# Patient Record
Sex: Male | Born: 1980 | Race: White | Hispanic: No | Smoking: Never smoker
Health system: Southern US, Community
[De-identification: ages and names within clinical notes are randomized; demographics above are authoritative.]

## PROBLEM LIST (undated history)

## (undated) DIAGNOSIS — H8109 Meniere's disease, unspecified ear: Secondary | ICD-10-CM

## (undated) DIAGNOSIS — G43909 Migraine, unspecified, not intractable, without status migrainosus: Secondary | ICD-10-CM

## (undated) DIAGNOSIS — F329 Major depressive disorder, single episode, unspecified: Secondary | ICD-10-CM

## (undated) DIAGNOSIS — F419 Anxiety disorder, unspecified: Secondary | ICD-10-CM

## (undated) DIAGNOSIS — K219 Gastro-esophageal reflux disease without esophagitis: Secondary | ICD-10-CM

## (undated) DIAGNOSIS — G930 Cerebral cysts: Secondary | ICD-10-CM

## (undated) DIAGNOSIS — J309 Allergic rhinitis, unspecified: Secondary | ICD-10-CM

## (undated) DIAGNOSIS — R112 Nausea with vomiting, unspecified: Secondary | ICD-10-CM

## (undated) DIAGNOSIS — E890 Postprocedural hypothyroidism: Secondary | ICD-10-CM

## (undated) DIAGNOSIS — I10 Essential (primary) hypertension: Secondary | ICD-10-CM

## (undated) DIAGNOSIS — Z808 Family history of malignant neoplasm of other organs or systems: Secondary | ICD-10-CM

## (undated) DIAGNOSIS — Z9889 Other specified postprocedural states: Secondary | ICD-10-CM

## (undated) DIAGNOSIS — C189 Malignant neoplasm of colon, unspecified: Secondary | ICD-10-CM

## (undated) DIAGNOSIS — Z803 Family history of malignant neoplasm of breast: Secondary | ICD-10-CM

## (undated) DIAGNOSIS — E785 Hyperlipidemia, unspecified: Secondary | ICD-10-CM

## (undated) DIAGNOSIS — F458 Other somatoform disorders: Secondary | ICD-10-CM

## (undated) HISTORY — PX: XI ROBOTIC ASSISTED LOWER ANTERIOR RESECTION: SHX6558

## (undated) HISTORY — PX: OTHER SURGICAL HISTORY: SHX169

## (undated) HISTORY — DX: Essential (primary) hypertension: I10

## (undated) HISTORY — DX: Malignant neoplasm of colon, unspecified: C18.9

## (undated) HISTORY — DX: Hyperlipidemia, unspecified: E78.5

## (undated) HISTORY — DX: Allergic rhinitis, unspecified: J30.9

## (undated) HISTORY — DX: Other somatoform disorders: F45.8

## (undated) HISTORY — DX: Family history of malignant neoplasm of other organs or systems: Z80.8

## (undated) HISTORY — DX: Gastro-esophageal reflux disease without esophagitis: K21.9

## (undated) HISTORY — DX: Family history of malignant neoplasm of breast: Z80.3

## (undated) HISTORY — DX: Major depressive disorder, single episode, unspecified: F32.9

## (undated) HISTORY — DX: Anxiety disorder, unspecified: F41.9

---

## 2010-07-07 ENCOUNTER — Ambulatory Visit: Payer: Self-pay | Admitting: Internal Medicine

## 2010-07-07 DIAGNOSIS — F329 Major depressive disorder, single episode, unspecified: Secondary | ICD-10-CM

## 2010-07-07 DIAGNOSIS — K219 Gastro-esophageal reflux disease without esophagitis: Secondary | ICD-10-CM

## 2010-07-07 DIAGNOSIS — F411 Generalized anxiety disorder: Secondary | ICD-10-CM | POA: Insufficient documentation

## 2010-07-07 DIAGNOSIS — R5381 Other malaise: Secondary | ICD-10-CM

## 2010-07-07 DIAGNOSIS — I10 Essential (primary) hypertension: Secondary | ICD-10-CM

## 2010-07-07 DIAGNOSIS — J309 Allergic rhinitis, unspecified: Secondary | ICD-10-CM | POA: Insufficient documentation

## 2010-07-07 DIAGNOSIS — R6882 Decreased libido: Secondary | ICD-10-CM | POA: Insufficient documentation

## 2010-07-07 DIAGNOSIS — E785 Hyperlipidemia, unspecified: Secondary | ICD-10-CM

## 2010-07-07 DIAGNOSIS — R5383 Other fatigue: Secondary | ICD-10-CM

## 2010-07-08 LAB — CONVERTED CEMR LAB
ALT: 19 units/L (ref 0–53)
AST: 18 units/L (ref 0–37)
Alkaline Phosphatase: 79 units/L (ref 39–117)
BUN: 13 mg/dL (ref 6–23)
Bilirubin Urine: NEGATIVE
Calcium: 9.8 mg/dL (ref 8.4–10.5)
Cholesterol: 204 mg/dL — ABNORMAL HIGH (ref 0–200)
Direct LDL: 147.4 mg/dL
Eosinophils Relative: 4.1 % (ref 0.0–5.0)
GFR calc non Af Amer: 93.01 mL/min (ref >60)
HCT: 45.4 % (ref 39.0–52.0)
Hemoglobin: 16 g/dL (ref 13.0–17.0)
Iron: 139 ug/dL (ref 42–165)
Ketones, ur: NEGATIVE mg/dL
Lymphocytes Relative: 28.8 % (ref 12.0–46.0)
Lymphs Abs: 1.2 10*3/uL (ref 0.7–4.0)
Monocytes Relative: 7.3 % (ref 3.0–12.0)
Platelets: 325 10*3/uL (ref 150.0–400.0)
Potassium: 3.9 meq/L (ref 3.5–5.1)
Saturation Ratios: 48.2 % (ref 20.0–50.0)
Sex Hormone Binding: 45 nmol/L (ref 13–71)
Sodium: 140 meq/L (ref 135–145)
TSH: 0.52 microintl units/mL (ref 0.35–5.50)
Testosterone Free: 56.4 pg/mL (ref 47.0–244.0)
Total Bilirubin: 0.6 mg/dL (ref 0.3–1.2)
Total CHOL/HDL Ratio: 5
Total Protein, Urine: NEGATIVE mg/dL
Transferrin: 205.8 mg/dL — ABNORMAL LOW (ref 212.0–360.0)
Urine Glucose: NEGATIVE mg/dL
VLDL: 24 mg/dL (ref 0.0–40.0)
WBC: 4.1 10*3/uL — ABNORMAL LOW (ref 4.5–10.5)
pH: 5.5 (ref 5.0–8.0)

## 2010-12-21 NOTE — Letter (Signed)
Summary: Health Exam/[facility]  Health Exam/[facility]   Imported By: Sherian Rein 07/08/2010 11:47:52  _____________________________________________________________________  External Attachment:    Type:   Image     Comment:   External Document

## 2010-12-21 NOTE — Assessment & Plan Note (Signed)
Summary: NEW [employer] PT--PKG/OFF---#---STC   Vital Signs:  Patient profile:   30 year old male Height:      75 inches Weight:      217 pounds BMI:     27.22 O2 Sat:      96 % on Room air Temp:     97.1 degrees F oral Pulse rate:   77 / minute BP sitting:   114 / 70  (left arm) Cuff size:   large  Vitals Entered By: Zella Ball Ewing CMA Duncan Dull) (July 07, 2010 9:51 AM)  O2 Flow:  Room air  CC: New Patient Office visit/RE   CC:  New Patient Office visit/RE.  History of Present Illness: here to establish;  nexium too expensive ;  wants less expensive  and still with reflux since 30yo, no dysphagia, wt loss, abd pain , n/v or wt loss.   Pt denies CP, sob, doe, wheezing, orthopnea, pnd, worsening LE edema, palps, dizziness or syncope . Pt denies new neuro symptoms such as headache, facial or extremity weakness  No fever, wt loss, night sweats, loss of appetite or other constitutional symptoms  Has been trying to wean of fthe paxil by 10 mg per day for approx 1 wk.  Anxiety so far not worsening.  Has marked incr anxiety with crowds.  Does remember RAIU scan at [place] 1 yr ago normal by his recollection.    Preventive Screening-Counseling & Management  Alcohol-Tobacco     Smoking Status: never      Drug Use:  no.    Problems Prior to Update: None  Medications Prior to Update: 1)  None  Current Medications (verified): 1)  Hyzaar 100-12.5 Mg Tabs (Losartan Potassium-Hctz) .Marland Kitchen.. 1 By Mouth Once Daily 2)  Simvastatin 20 Mg Tabs (Simvastatin) .Marland Kitchen.. 1 By Mouth Once Daily 3)  Paxil 20 Mg Tabs (Paroxetine Hcl) .... 1/2 By Mouth Once Daily 4)  Flonase 50 Mcg/act Susp (Fluticasone Propionate) .... 2 Sprays Each Side Once Daily As Needed 5)  Prilosec Otc 20 Mg Tbec (Omeprazole Magnesium) .Marland Kitchen.. 1 By Mouth Once Daily  Allergies (verified): No Known Drug Allergies  Past History:  Family History: Last updated: 07/07/2010 mother with breast cancer, thyroid disease and removed father and  mult secondary relatives with heart disease , CVA, HTN, elev chol    Social History: Last updated: 07/07/2010 wife is NP - for GI in [place] Married 1 child work - Administrator, arts and football coach - [employer] Never Smoked but uses smokeless tobacco Alcohol use-no Drug use-no  Risk Factors: Smoking Status: never (07/07/2010)  Past Medical History: Anxiety Depression Hyperlipidemia Hypertension ? hyperthyroid with low TSH GERD Allergic rhinitis  Past Surgical History: Denies surgical history  Family History: Reviewed history and no changes required. mother with breast cancer, thyroid disease and removed father and mult secondary relatives with heart disease , CVA, HTN, elev chol    Social History: Reviewed history and no changes required. wife is NP - for GI in [place] Married 1 child work - Administrator, arts and football coach - [employer] Never Smoked but uses smokeless tobacco Alcohol use-no Drug use-no Smoking Status:  never Drug Use:  no  Review of Systems  The patient denies anorexia, fever, vision loss, decreased hearing, hoarseness, chest pain, syncope, dyspnea on exertion, peripheral edema, prolonged cough, headaches, hemoptysis, abdominal pain, melena, hematochezia, severe indigestion/heartburn, hematuria, muscle weakness, suspicious skin lesions, transient blindness, difficulty walking, depression, unusual weight change, abnormal bleeding, enlarged lymph  nodes, and angioedema.         all otherwise negative per pt -  except for marked reduced liibido - ? need testosterone checked;  also palpiatoins;  no real wt change - fluct - 205 to 220, has some heat intolracne as well  Physical Exam  General:  alert and overweight-appearing. - mild  Head:  normocephalic and atraumatic.   Eyes:  vision grossly intact, pupils equal, and pupils round.   Ears:  R ear normal and L ear normal.   Nose:  no external deformity and no nasal discharge.     Mouth:  no gingival abnormalities and pharynx pink and moist.   Neck:  supple and no masses.   Lungs:  normal respiratory effort and normal breath sounds.   Heart:  normal rate and regular rhythm.   Abdomen:  soft, non-tender, and normal bowel sounds.   Msk:  no joint tenderness and no joint swelling.   Extremities:  no edema, no erythema  Neurologic:  cranial nerves II-XII intact and strength normal in all extremities.   Skin:  color normal and no rashes.   Psych:  moderately anxious.     Impression & Recommendations:  Problem # 1:  Preventive Health Care (ICD-V70.0)  Overall doing well, age appropriate education and counseling updated and referral for appropriate preventive services done unless declined, immunizations up to date or declined, diet counseling done if overweight, urged to quit smoking if smokes , most recent labs reviewed and current ordered if appropriate, ecg reviewed or declined (interpretation per ECG scanned in the EMR if done); information regarding Medicare Prevention requirements given if appropriate; speciality referrals updated as appropriate   Orders: TLB-BMP (Basic Metabolic Panel-BMET) (80048-METABOL) TLB-CBC Platelet - w/Differential (85025-CBCD) TLB-Hepatic/Liver Function Pnl (80076-HEPATIC) TLB-Lipid Panel (80061-LIPID) TLB-TSH (Thyroid Stimulating Hormone) (84443-TSH) TLB-Udip ONLY (81003-UDIP)  Problem # 2:  LIBIDO, DECREASED (ICD-799.81)  for testosterone check   Orders: T-Testosterone, Free and Total ([id number])  Problem # 3:  HYPERTENSION (ICD-401.9)  His updated medication list for this problem includes:    Hyzaar 100-12.5 Mg Tabs (Losartan potassium-hctz) .Marland Kitchen... 1 by mouth once daily  BP today: 114/70 stable overall by hx and exam, ok to continue meds/tx as is   Problem # 4:  HYPERLIPIDEMIA (ICD-272.4)  His updated medication list for this problem includes:    Simvastatin 20 Mg Tabs (Simvastatin) .Marland Kitchen... 1 by mouth once  daily d/w pt - to cont diet, check labs.  Pt to continue diet efforts, good med tolerance; to check labs - goal LDL less than 70 given the family hx  Problem # 5:  GERD (ICD-530.81)  His updated medication list for this problem includes:    Omeprazole 20 Mg Cpdr (Omeprazole) .Marland Kitchen... 1po two times a day treat as above, f/u any worsening signs or symptoms   Problem # 6:  ALLERGIC RHINITIS (ICD-477.9)  His updated medication list for this problem includes:    Flonase 50 Mcg/act Susp (Fluticasone propionate) .Marland Kitchen... 2 sprays each side once daily as needed stable overall by hx and exam, ok to continue meds/tx as is   Problem # 7:  ANXIETY (ICD-300.00)  His updated medication list for this problem includes:    Paxil 20 Mg Tabs (Paroxetine hcl) .Marland Kitchen... 1 by mouth once daily pt trying to wean off paxil, encouraged ok to try, for f/u any worsening symptoms  Complete Medication List: 1)  Hyzaar 100-12.5 Mg Tabs (Losartan potassium-hctz) .Marland Kitchen.. 1 by mouth once daily 2)  Simvastatin 20 Mg  Tabs (Simvastatin) .Marland Kitchen.. 1 by mouth once daily 3)  Paxil 20 Mg Tabs (Paroxetine hcl) .Marland Kitchen.. 1 by mouth once daily 4)  Flonase 50 Mcg/act Susp (Fluticasone propionate) .... 2 sprays each side once daily as needed 5)  Omeprazole 20 Mg Cpdr (Omeprazole) .Marland Kitchen.. 1po two times a day  Other Orders: TLB-IBC Pnl (Iron/FE;Transferrin) (83550-IBC) TLB-B12 + Folate Pnl (82746_82607-B12/FOL)  Patient Instructions: 1)  Your form was filled out today 2)  Continue all previous medications as before this visit , except for weaing of the paxil as we discussed 3)  Please go to the Lab in the basement for your blood and/or urine tests today 4)  Please call the number on the [facility] Card for results of your testing  5)  Please schedule a follow-up appointment in 1 year, or sooner if needed 6)  We will call if any medications need to be changed 7)  Please call if you would like to be started on a medication different from Paxil, such as  citalopram or Lexapro for nerves Prescriptions: OMEPRAZOLE 20 MG CPDR (OMEPRAZOLE) 1po two times a day  #180 x 3   Entered and Authorized by:   Corwin Levins MD   Signed by:   Corwin Levins MD on 07/07/2010   Method used:   Print then Give to Patient   RxID:   [id number] FLONASE 50 MCG/ACT SUSP (FLUTICASONE PROPIONATE) 2 sprays each side once daily as needed  #3 x 3   Entered and Authorized by:   Corwin Levins MD   Signed by:   Corwin Levins MD on 07/07/2010   Method used:   Print then Give to Patient   RxID:   [id number] SIMVASTATIN 20 MG TABS (SIMVASTATIN) 1 by mouth once daily  #90 x 3   Entered and Authorized by:   Corwin Levins MD   Signed by:   Corwin Levins MD on 07/07/2010   Method used:   Print then Give to Patient   RxID:   [id number] FTDDUK 100-12.5 MG TABS (LOSARTAN POTASSIUM-HCTZ) 1 by mouth once daily  #90 x 3   Entered and Authorized by:   Corwin Levins MD   Signed by:   Corwin Levins MD on 07/07/2010   Method used:   Print then Give to Patient   RxID:   [id number] DVVOHY 100-12.5 MG TABS (LOSARTAN POTASSIUM-HCTZ) 1 by mouth once daily  #30 x 11   Entered and Authorized by:   Corwin Levins MD   Signed by:   Corwin Levins MD on 07/07/2010   Method used:   Print then Give to Patient   RxID:   [id number] SIMVASTATIN 20 MG TABS (SIMVASTATIN) 1 by mouth once daily  #30 x 11   Entered and Authorized by:   Corwin Levins MD   Signed by:   Corwin Levins MD on 07/07/2010   Method used:   Print then Give to Patient   RxID:   [id number] PAXIL 20 MG TABS (PAROXETINE HCL) 1 by mouth once daily  #30 x 11   Entered and Authorized by:   Corwin Levins MD   Signed by:   Corwin Levins MD on 07/07/2010   Method used:   Print then Give to Patient   RxID:   [id number] FLONASE 50 MCG/ACT SUSP (FLUTICASONE PROPIONATE) 2 sprays each side once daily as needed  #1 x 11   Entered and Authorized by:   Len Blalock  John MD   Signed by:   Corwin Levins MD on 07/07/2010    Method used:   Print then Give to Patient   RxID:   [id number] OMEPRAZOLE 20 MG CPDR (OMEPRAZOLE) 1po two times a day  #60 x 11   Entered and Authorized by:   Corwin Levins MD   Signed by:   Corwin Levins MD on 07/07/2010   Method used:   Print then Give to Patient   RxID:   [id number]    Immunization History:  Tetanus/Td Immunization History:    Tetanus/Td:  historical (06/21/2004)

## 2011-08-14 ENCOUNTER — Other Ambulatory Visit: Payer: Self-pay | Admitting: Internal Medicine

## 2011-08-21 ENCOUNTER — Other Ambulatory Visit: Payer: Self-pay | Admitting: Internal Medicine

## 2011-08-22 NOTE — Telephone Encounter (Signed)
Ok to refill as per usual office protocol'  LOV aug 2011

## 2011-09-25 ENCOUNTER — Other Ambulatory Visit: Payer: Self-pay | Admitting: Internal Medicine

## 2011-09-27 ENCOUNTER — Other Ambulatory Visit: Payer: Self-pay

## 2011-09-27 MED ORDER — LOSARTAN POTASSIUM-HCTZ 100-12.5 MG PO TABS: 1.0000 | ORAL_TABLET | Freq: Every day | ORAL | Status: DC

## 2011-09-27 MED ORDER — SIMVASTATIN 40 MG PO TABS: 40.0000 mg | ORAL_TABLET | Freq: Every day | ORAL | Status: DC

## 2011-10-07 ENCOUNTER — Other Ambulatory Visit: Payer: Self-pay

## 2011-10-07 MED ORDER — LOSARTAN POTASSIUM-HCTZ 100-12.5 MG PO TABS: 1.0000 | ORAL_TABLET | Freq: Every day | ORAL | Status: DC

## 2011-10-07 MED ORDER — SIMVASTATIN 40 MG PO TABS: 40.0000 mg | ORAL_TABLET | Freq: Every day | ORAL | Status: DC

## 2011-10-07 MED ORDER — OMEPRAZOLE 20 MG PO CPDR: 20.0000 mg | DELAYED_RELEASE_CAPSULE | Freq: Two times a day (BID) | ORAL | Status: DC

## 2011-10-08 ENCOUNTER — Encounter: Payer: Self-pay | Admitting: Internal Medicine

## 2011-10-08 DIAGNOSIS — Z0000 Encounter for general adult medical examination without abnormal findings: Secondary | ICD-10-CM | POA: Insufficient documentation

## 2011-10-12 ENCOUNTER — Ambulatory Visit: Payer: Self-pay | Admitting: Internal Medicine

## 2011-10-19 ENCOUNTER — Ambulatory Visit (INDEPENDENT_AMBULATORY_CARE_PROVIDER_SITE_OTHER): Payer: BC Managed Care – PPO | Admitting: Internal Medicine

## 2011-10-19 ENCOUNTER — Other Ambulatory Visit (INDEPENDENT_AMBULATORY_CARE_PROVIDER_SITE_OTHER): Payer: BC Managed Care – PPO

## 2011-10-19 ENCOUNTER — Encounter: Payer: Self-pay | Admitting: Internal Medicine

## 2011-10-19 VITALS — BP 132/82 | HR 69 | Temp 98.5°F | Ht 75.0 in | Wt 212.5 lb

## 2011-10-19 DIAGNOSIS — E785 Hyperlipidemia, unspecified: Secondary | ICD-10-CM

## 2011-10-19 DIAGNOSIS — R519 Headache, unspecified: Secondary | ICD-10-CM | POA: Insufficient documentation

## 2011-10-19 DIAGNOSIS — I10 Essential (primary) hypertension: Secondary | ICD-10-CM

## 2011-10-19 DIAGNOSIS — R51 Headache: Secondary | ICD-10-CM

## 2011-10-19 DIAGNOSIS — Z0000 Encounter for general adult medical examination without abnormal findings: Secondary | ICD-10-CM

## 2011-10-19 DIAGNOSIS — F411 Generalized anxiety disorder: Secondary | ICD-10-CM

## 2011-10-19 DIAGNOSIS — K219 Gastro-esophageal reflux disease without esophagitis: Secondary | ICD-10-CM

## 2011-10-19 DIAGNOSIS — G43009 Migraine without aura, not intractable, without status migrainosus: Secondary | ICD-10-CM | POA: Insufficient documentation

## 2011-10-19 DIAGNOSIS — F458 Other somatoform disorders: Secondary | ICD-10-CM | POA: Insufficient documentation

## 2011-10-19 LAB — HEPATIC FUNCTION PANEL
AST: 21 U/L (ref 0–37)
Albumin: 4.2 g/dL (ref 3.5–5.2)
Alkaline Phosphatase: 61 U/L (ref 39–117)
Total Bilirubin: 0.5 mg/dL (ref 0.3–1.2)

## 2011-10-19 LAB — URINALYSIS, ROUTINE W REFLEX MICROSCOPIC
Bilirubin Urine: NEGATIVE
Specific Gravity, Urine: >=1.03 (ref 1.000–1.030)
Total Protein, Urine: NEGATIVE
Urine Glucose: NEGATIVE
pH: 5.5 (ref 5.0–8.0)

## 2011-10-19 LAB — BASIC METABOLIC PANEL
BUN: 13 mg/dL (ref 6–23)
Calcium: 9.1 mg/dL (ref 8.4–10.5)
GFR: 94.41 mL/min (ref >60.00)
Glucose, Bld: 70 mg/dL (ref 70–99)
Potassium: 3.8 mEq/L (ref 3.5–5.1)
Sodium: 140 mEq/L (ref 135–145)

## 2011-10-19 LAB — CBC WITH DIFFERENTIAL/PLATELET
Eosinophils Relative: 3.6 % (ref 0.0–5.0)
HCT: 43.5 % (ref 39.0–52.0)
Hemoglobin: 14.8 g/dL (ref 13.0–17.0)
Lymphs Abs: 1.3 10*3/uL (ref 0.7–4.0)
MCV: 92.1 fl (ref 78.0–100.0)
Monocytes Absolute: 0.5 10*3/uL (ref 0.1–1.0)
Monocytes Relative: 9.5 % (ref 3.0–12.0)
Neutro Abs: 2.9 10*3/uL (ref 1.4–7.7)
Platelets: 278 10*3/uL (ref 150.0–400.0)
WBC: 4.8 10*3/uL (ref 4.5–10.5)

## 2011-10-19 LAB — LIPID PANEL
LDL Cholesterol: 97 mg/dL (ref 0–99)
Total CHOL/HDL Ratio: 4
VLDL: 37.6 mg/dL (ref 0.0–40.0)

## 2011-10-19 MED ORDER — SUMATRIPTAN SUCCINATE 100 MG PO TABS: 100.0000 mg | ORAL_TABLET | ORAL | Status: DC | PRN

## 2011-10-19 MED ORDER — SIMVASTATIN 40 MG PO TABS: 40.0000 mg | ORAL_TABLET | Freq: Every day | ORAL | Status: DC

## 2011-10-19 MED ORDER — PAROXETINE HCL 20 MG PO TABS: 20.0000 mg | ORAL_TABLET | Freq: Every day | ORAL | Status: DC

## 2011-10-19 MED ORDER — LOSARTAN POTASSIUM-HCTZ 100-12.5 MG PO TABS: 1.0000 | ORAL_TABLET | Freq: Every day | ORAL | Status: DC

## 2011-10-19 MED ORDER — PANTOPRAZOLE SODIUM 40 MG PO TBEC: 40.0000 mg | DELAYED_RELEASE_TABLET | Freq: Every day | ORAL | Status: DC

## 2011-10-19 MED ORDER — FLUTICASONE PROPIONATE 50 MCG/ACT NA SUSP: 2.0000 | Freq: Every day | NASAL | Status: AC

## 2011-10-19 NOTE — Patient Instructions (Addendum)
Take all new medications as prescribed  - the imitrex You can also consider excedrin migraine otc for milder headaches OK to stop the prilosec Start the protonix generic  - 1 per day Continue all other medications as before All of your prescriptions were sent to [facility] Please go to LAB in the Basement for the blood and/or urine tests to be done today Please call the phone number [phone] (the [employer] System) for results of testing in 2-3 days;  When calling, simply dial the number, and when prompted enter the MRN number above (the Medical Record Number) and the # key, then the message should start. Please return in 1 year for your yearly visit, or sooner if needed, with Lab testing done 3-5 days before

## 2011-10-19 NOTE — Assessment & Plan Note (Signed)
stable overall by hx and exam, most recent data reviewed with pt, and pt to continue medical treatment as before BP Readings from Last 3 Encounters:  10/19/11 132/82  07/07/10 114/70

## 2011-10-19 NOTE — Assessment & Plan Note (Signed)
stable overall by hx and exam, most recent data reviewed with pt, and pt to continue medical treatment as before  Lab Results  Component Value Date   LDLCALC 97 10/19/2011

## 2011-10-19 NOTE — Progress Notes (Signed)
Subjective:    Patient ID: Jacob Hayes, male    DOB: [birth year], 30 y.o.   MRN: [id number]  HPI Here for wellness and f/u;  Overall doing ok;  Pt denies CP, worsening SOB, DOE, wheezing, orthopnea, PND, worsening LE edema, palpitations, dizziness or syncope.  Pt denies neurological change such as new Headache, facial or extremity weakness.  Pt denies polydipsia, polyuria, or low sugar symptoms. Pt states overall good compliance with treatment and medications, good tolerability, and trying to follow lower cholesterol diet.  Pt denies worsening depressive symptoms, suicidal ideation or panic. No fever, wt loss, night sweats, loss of appetite, or other constitutional symptoms.  Pt states good ability with ADL's, low fall risk, home safety reviewed and adequate, no significant changes in hearing or vision, and occasionally active with exercise.  Has gotten himself down to 10 mg paxil, plans to try to wean off further if possible. prilosec apparently requires PA, so needs change Past Medical History  Diagnosis Date  . Migraine 10/19/2011  . ALLERGIC RHINITIS 07/07/2010  . ANXIETY 07/07/2010  . DEPRESSION 07/07/2010  . GERD 07/07/2010  . HYPERLIPIDEMIA 07/07/2010  . HYPERTENSION 07/07/2010  . Bruxism 10/19/2011   No past surgical history on file.  reports that he has never smoked. He does not have any smokeless tobacco history on file. His alcohol and drug histories not on file. family history is not on file. No Known Allergies   . Current Outpatient Prescriptions on File Prior to Visit  Medication Sig Dispense Refill  . losartan-hydrochlorothiazide (HYZAAR) 100-12.5 MG per tablet Take 1 tablet by mouth daily.  30 tablet  0  . omeprazole (PRILOSEC) 20 MG capsule Take 1 capsule (20 mg total) by mouth 2 (two) times daily.  60 capsule  0  . PARoxetine (PAXIL) 20 MG tablet TAKE ONE TABLET BY MOUTH ONE TIME DAILY  90 tablet  0  . simvastatin (ZOCOR) 40 MG tablet Take 1 tablet (40 mg total) by mouth at  bedtime.  30 tablet  0    Review of Systems Review of Systems  Constitutional: Negative for diaphoresis, activity change, appetite change and unexpected weight change.  HENT: Negative for hearing loss, ear pain, facial swelling, mouth sores and neck stiffness.   Eyes: Negative for pain, redness and visual disturbance.  Respiratory: Negative for shortness of breath and wheezing.   Cardiovascular: Negative for chest pain and palpitations.  Gastrointestinal: Negative for diarrhea, blood in stool, abdominal distention and rectal pain.  Genitourinary: Negative for hematuria, flank pain and decreased urine volume.  Musculoskeletal: Negative for myalgias and joint swelling.  Skin: Negative for color change and wound.  Neurological: Negative for syncope and numbness.  Hematological: Negative for adenopathy.  Psychiatric/Behavioral: Negative for hallucinations, self-injury, decreased concentration and agitation.      Objective:   Physical Exam BP 132/82  Pulse 69  Temp(Src) 98.5 F (36.9 C) (Oral)  Ht 6\' 3"  (1.905 m)  Wt 212 lb 8 oz (96.389 kg)  BMI 26.56 kg/m2  SpO2 97% Physical Exam  VS noted Constitutional: Pt is oriented to person, place, and time. Appears well-developed and well-nourished.  HENT:  Head: Normocephalic and atraumatic.  Right Ear: External ear normal.  Left Ear: External ear normal.  Nose: Nose normal.  Mouth/Throat: Oropharynx is clear and moist.  Eyes: Conjunctivae and EOM are normal. Pupils are equal, round, and reactive to light.  Neck: Normal range of motion. Neck supple. No JVD present. No tracheal deviation present.  Cardiovascular: Normal rate, regular rhythm,  normal heart sounds and intact distal pulses.   Pulmonary/Chest: Effort normal and breath sounds normal.  Abdominal: Soft. Bowel sounds are normal. There is no tenderness.  Musculoskeletal: Normal range of motion. Exhibits no edema.  Lymphadenopathy:  Has no cervical adenopathy.  Neurological: Pt is  alert and oriented to person, place, and time. Pt has normal reflexes. No cranial nerve deficit.  Skin: Skin is warm and dry. No rash noted.  Psychiatric:  Has  normal mood and affect. Behavior is normal. 1-2+ nervous    Assessment & Plan:

## 2011-10-19 NOTE — Assessment & Plan Note (Signed)
Ok to cont wean off paxil by half for 4 wks then stop, though I think he may need ongoing tx, possibly with different ssri

## 2011-10-19 NOTE — Assessment & Plan Note (Signed)
Ok to try change prilosec to protonix asd, o/w stable overall by hx and exam, most recent data reviewed with pt, and pt to continue medical treatment as before Lab Results  Component Value Date   WBC 4.8 10/19/2011   HGB 14.8 10/19/2011   HCT 43.5 10/19/2011   PLT 278.0 10/19/2011   GLUCOSE 70 10/19/2011   CHOL 178 10/19/2011   TRIG 188.0* 10/19/2011   HDL 43.10 10/19/2011   LDLDIRECT 147.4 07/07/2010   LDLCALC 97 10/19/2011   ALT 28 10/19/2011   AST 21 10/19/2011   NA 140 10/19/2011   K 3.8 10/19/2011   CL 104 10/19/2011   CREATININE 1.0 10/19/2011   BUN 13 10/19/2011   CO2 27 10/19/2011   TSH 0.30* 10/19/2011

## 2011-10-19 NOTE — Assessment & Plan Note (Addendum)
Every 10 days or so, Mixed headache most likely, and maybe even related to bruxism with HA in the AM;  For imitrex prn,  to f/u any worsening symptoms or concerns

## 2011-10-19 NOTE — Assessment & Plan Note (Signed)

## 2011-10-19 NOTE — Assessment & Plan Note (Signed)
Ok for dental appliance

## 2011-10-20 ENCOUNTER — Encounter: Payer: Self-pay | Admitting: Internal Medicine

## 2011-10-21 ENCOUNTER — Ambulatory Visit: Payer: Self-pay | Admitting: Internal Medicine

## 2011-12-14 ENCOUNTER — Telehealth: Payer: Self-pay

## 2011-12-14 NOTE — Telephone Encounter (Signed)
Received PA approval for Pantoprazole from Jan. 2, 2013 through Jan. 23, 2014. Case number [id number]. Will call the patient and pharmacy to notify.

## 2012-06-29 ENCOUNTER — Encounter (HOSPITAL_COMMUNITY): Payer: Self-pay

## 2012-06-29 ENCOUNTER — Emergency Department (HOSPITAL_COMMUNITY)
Admission: EM | Admit: 2012-06-29 | Discharge: 2012-06-29 | Disposition: A | Discharge status: Home / Self Care | Payer: BC Managed Care – PPO | Attending: Emergency Medicine | Admitting: Emergency Medicine

## 2012-06-29 DIAGNOSIS — K219 Gastro-esophageal reflux disease without esophagitis: Secondary | ICD-10-CM | POA: Insufficient documentation

## 2012-06-29 DIAGNOSIS — R112 Nausea with vomiting, unspecified: Secondary | ICD-10-CM | POA: Insufficient documentation

## 2012-06-29 DIAGNOSIS — F329 Major depressive disorder, single episode, unspecified: Secondary | ICD-10-CM | POA: Insufficient documentation

## 2012-06-29 DIAGNOSIS — E785 Hyperlipidemia, unspecified: Secondary | ICD-10-CM | POA: Insufficient documentation

## 2012-06-29 DIAGNOSIS — X30XXXA Exposure to excessive natural heat, initial encounter: Secondary | ICD-10-CM | POA: Insufficient documentation

## 2012-06-29 DIAGNOSIS — I10 Essential (primary) hypertension: Secondary | ICD-10-CM | POA: Insufficient documentation

## 2012-06-29 DIAGNOSIS — J309 Allergic rhinitis, unspecified: Secondary | ICD-10-CM | POA: Insufficient documentation

## 2012-06-29 DIAGNOSIS — T675XXA Heat exhaustion, unspecified, initial encounter: Secondary | ICD-10-CM

## 2012-06-29 DIAGNOSIS — R42 Dizziness and giddiness: Secondary | ICD-10-CM | POA: Insufficient documentation

## 2012-06-29 DIAGNOSIS — F411 Generalized anxiety disorder: Secondary | ICD-10-CM | POA: Insufficient documentation

## 2012-06-29 DIAGNOSIS — R5381 Other malaise: Secondary | ICD-10-CM | POA: Insufficient documentation

## 2012-06-29 DIAGNOSIS — F3289 Other specified depressive episodes: Secondary | ICD-10-CM | POA: Insufficient documentation

## 2012-06-29 DIAGNOSIS — Z79899 Other long term (current) drug therapy: Secondary | ICD-10-CM | POA: Insufficient documentation

## 2012-06-29 LAB — BASIC METABOLIC PANEL
BUN: 10 mg/dL (ref 6–23)
Creatinine, Ser: 1.02 mg/dL (ref 0.50–1.35)
GFR calc Af Amer: >90 mL/min (ref >90)
GFR calc non Af Amer: >90 mL/min (ref >90)
Glucose, Bld: 109 mg/dL — ABNORMAL HIGH (ref 70–99)

## 2012-06-29 LAB — CBC WITH DIFFERENTIAL/PLATELET
Basophils Absolute: 0 10*3/uL (ref 0.0–0.1)
Basophils Relative: 0 % (ref 0–1)
Eosinophils Absolute: 0 10*3/uL (ref 0.0–0.7)
Hemoglobin: 13.2 g/dL (ref 13.0–17.0)
MCH: 30.6 pg (ref 26.0–34.0)
MCHC: 35 g/dL (ref 30.0–36.0)
Monocytes Absolute: 0.4 10*3/uL (ref 0.1–1.0)
Monocytes Relative: 4 % (ref 3–12)
Neutro Abs: 8.7 10*3/uL — ABNORMAL HIGH (ref 1.7–7.7)
Neutrophils Relative %: 88 % — ABNORMAL HIGH (ref 43–77)
RDW: 12.2 % (ref 11.5–15.5)

## 2012-06-29 LAB — URINALYSIS, ROUTINE W REFLEX MICROSCOPIC
Bilirubin Urine: NEGATIVE
Hgb urine dipstick: NEGATIVE
Nitrite: NEGATIVE
Protein, ur: NEGATIVE mg/dL
Specific Gravity, Urine: 1.015 (ref 1.005–1.030)
Urobilinogen, UA: 0.2 mg/dL (ref 0.0–1.0)

## 2012-06-29 MED ORDER — SODIUM CHLORIDE 0.9 % IV BOLUS (SEPSIS)
1000.0000 mL | Freq: Once | INTRAVENOUS | Status: AC
  Administered 2012-06-29: 1000 mL via INTRAVENOUS

## 2012-06-29 MED ORDER — SODIUM CHLORIDE 0.9 % IV SOLN: INTRAVENOUS | Status: DC

## 2012-06-29 MED ORDER — ONDANSETRON HCL 4 MG/2ML IJ SOLN
4.0000 mg | Freq: Once | INTRAMUSCULAR | Status: AC
  Administered 2012-06-29: 4 mg via INTRAVENOUS
  Filled 2012-06-29: qty 2

## 2012-06-29 MED ORDER — ONDANSETRON 4 MG PO TBDP: 4.0000 mg | ORAL_TABLET | Freq: Three times a day (TID) | ORAL | Status: AC | PRN

## 2012-06-29 NOTE — ED Notes (Signed)
Pt states he has been out side from 0900 to 1145. States he became nauseated, dizzy and weak.

## 2012-06-29 NOTE — ED Notes (Signed)
Pt taken to lab for workers compensation testing.

## 2012-06-29 NOTE — ED Notes (Signed)
Pt states that he feels much better now and is ready to go home. States that he just feels really tired.

## 2012-06-29 NOTE — ED Provider Notes (Signed)
History   This chart was scribed for Jacob Jakes, MD by [name] . The patient was seen in room APAH4/APAH4. Patient's care was started at 1438.    CSN: [id number]  Arrival date & time 06/29/12  1408   First MD Initiated Contact with Patient 06/29/12 1438      Chief Complaint  Patient presents with  . Heat Exposure    (Consider location/radiation/quality/duration/timing/severity/associated sxs/prior treatment) HPI Jacob Hayes is a 31 y.o. male who presents to the Emergency Department complaining of constant, moderate generalized weakness after prolonged exposure to heat earlier today. Pt reports associated nausea, dizziness, diaphoresis, and tingling in his hands and feet. Pt states that later in the day, he started vomiting and had SOB. Pt reports a h/o heat sensitivity and states that he had a similar episode a couple of years ago.   Past Medical History  Diagnosis Date  . Migraine 10/19/2011  . ALLERGIC RHINITIS 07/07/2010  . ANXIETY 07/07/2010  . DEPRESSION 07/07/2010  . GERD 07/07/2010  . HYPERLIPIDEMIA 07/07/2010  . HYPERTENSION 07/07/2010  . Bruxism 10/19/2011  . FATIGUE 07/07/2010  . Headache 10/19/2011  . LIBIDO, DECREASED 07/07/2010    History reviewed. No pertinent past surgical history.  Family History  Problem Relation Age of Onset  . Breast cancer Mother   . Thyroid disease Mother     thyroid removed  . Heart disease Father   . Stroke Father   . Hyperlipidemia Father     History  Substance Use Topics  . Smoking status: Never Smoker   . Smokeless tobacco: Current User  . Alcohol Use: No      Review of Systems  Constitutional: Positive for diaphoresis.  Eyes: Positive for visual disturbance.  Respiratory: Positive for shortness of breath.   Cardiovascular: Negative for chest pain.  Gastrointestinal: Positive for nausea and vomiting. Negative for abdominal pain and diarrhea.  Neurological: Positive for dizziness, weakness and  numbness. Negative for headaches.  All other systems reviewed and are negative.    Allergies  Review of patient's allergies indicates no known allergies.  Home Medications   Current Outpatient Rx  Name Route Sig Dispense Refill  . FLUTICASONE PROPIONATE 50 MCG/ACT NA SUSP Nasal Place 2 sprays into the nose daily. 16 g 11  . LOSARTAN POTASSIUM-HCTZ 100-12.5 MG PO TABS Oral Take 1 tablet by mouth daily. 90 tablet 3    Need Office Visit for further refills  . PANTOPRAZOLE SODIUM 40 MG PO TBEC Oral Take 1 tablet (40 mg total) by mouth daily. 90 tablet 3  . PAROXETINE HCL 20 MG PO TABS Oral Take 1 tablet (20 mg total) by mouth daily. 90 tablet 3  . SIMVASTATIN 40 MG PO TABS Oral Take 1 tablet (40 mg total) by mouth at bedtime. 90 tablet 3    Needs to call and schedule office visit for furthe ...  . SUMATRIPTAN SUCCINATE 100 MG PO TABS Oral Take 1 tablet (100 mg total) by mouth every 2 (two) hours as needed for migraine. 9 tablet 5    BP 121/68  Pulse 63  Temp 98.3 F (36.8 C) (Oral)  Resp 20  Ht 6\' 3"  (1.905 m)  Wt 208 lb (94.348 kg)  BMI 26.00 kg/m2  SpO2 100%  Physical Exam  Nursing note and vitals reviewed. Constitutional: He is oriented to person, place, and time. He appears well-developed and well-nourished. No distress.  HENT:  Head: Normocephalic and atraumatic.       Mucous membranes slightly  dry.   Eyes: EOM are normal. Pupils are equal, round, and reactive to light.  Neck: Neck supple. No tracheal deviation present.  Cardiovascular: Normal rate, regular rhythm and normal heart sounds.   No murmur heard. Pulmonary/Chest: Effort normal and breath sounds normal. No respiratory distress. He has no wheezes.  Abdominal: Soft. Bowel sounds are normal. He exhibits no distension. There is no tenderness.  Musculoskeletal: Normal range of motion. He exhibits no edema.  Neurological: He is alert and oriented to person, place, and time. No cranial nerve deficit or sensory  deficit. Coordination normal.       Moves all extremities. Neuro exam normal.   Skin: Skin is warm and dry.  Psychiatric: He has a normal mood and affect. His behavior is normal.    ED Course  Procedures (including critical care time)  DIAGNOSTIC STUDIES: Oxygen Saturation is 100% on room air, normal by my interpretation.    COORDINATION OF CARE:  14:52-Discussed planned course of treatment with the patient including IV fluids, UA and blood work, who is agreeable at this time.   15:15-Medication Orders: Sodium chloride 0.9% bolus 1,000 mL-once.   Labs Reviewed - No data to display No results found.   No diagnosis found.  Date: 06/29/2012  Rate: 54  Rhythm: sinus bradycardia and sinus arrhythmia  QRS Axis: normal  Intervals: normal  ST/T Wave abnormalities: early repolarization  Conduction Disutrbances:none  Narrative Interpretation:   Old EKG Reviewed: none available and unchanged   Results for orders placed during the hospital encounter of 06/29/12  BASIC METABOLIC PANEL      Component Value Range   Sodium 137  135 - 145 mEq/L   Potassium 3.8  3.5 - 5.1 mEq/L   Chloride 104  96 - 112 mEq/L   CO2 26  19 - 32 mEq/L   Glucose, Bld 109 (*) 70 - 99 mg/dL   BUN 10  6 - 23 mg/dL   Creatinine, Ser 1.61  0.50 - 1.35 mg/dL   Calcium 9.2  8.4 - 09.6 mg/dL   GFR calc non Af Amer >90  >90 mL/min   GFR calc Af Amer >90  >90 mL/min  CBC WITH DIFFERENTIAL      Component Value Range   WBC 9.8  4.0 - 10.5 K/uL   RBC 4.32  4.22 - 5.81 MIL/uL   Hemoglobin 13.2  13.0 - 17.0 g/dL   HCT 04.5 (*) 40.9 - 81.1 %   MCV 87.3  78.0 - 100.0 fL   MCH 30.6  26.0 - 34.0 pg   MCHC 35.0  30.0 - 36.0 g/dL   RDW 91.4  78.2 - 95.6 %   Platelets 208  150 - 400 K/uL   Neutrophils Relative 88 (*) 43 - 77 %   Neutro Abs 8.7 (*) 1.7 - 7.7 K/uL   Lymphocytes Relative 7 (*) 12 - 46 %   Lymphs Abs 0.7  0.7 - 4.0 K/uL   Monocytes Relative 4  3 - 12 %   Monocytes Absolute 0.4  0.1 - 1.0 K/uL    Eosinophils Relative 0  0 - 5 %   Eosinophils Absolute 0.0  0.0 - 0.7 K/uL   Basophils Relative 0  0 - 1 %   Basophils Absolute 0.0  0.0 - 0.1 K/uL  URINALYSIS, ROUTINE W REFLEX MICROSCOPIC      Component Value Range   Color, Urine YELLOW  YELLOW   APPearance CLEAR  CLEAR   Specific Gravity, Urine 1.015  1.005 - 1.030   pH 8.0  5.0 - 8.0   Glucose, UA NEGATIVE  NEGATIVE mg/dL   Hgb urine dipstick NEGATIVE  NEGATIVE   Bilirubin Urine NEGATIVE  NEGATIVE   Ketones, ur NEGATIVE  NEGATIVE mg/dL   Protein, ur NEGATIVE  NEGATIVE mg/dL   Urobilinogen, UA 0.2  0.0 - 1.0 mg/dL   Nitrite NEGATIVE  NEGATIVE   Leukocytes, UA NEGATIVE  NEGATIVE     MDM  Patient symptoms not inconsistent with heat exhaustion workup in the emergency department without any significant lab abnormalities urinalysis without hemoglobin. Patient improved with IV fluids and Zofran. Patient has had trouble in the past with heat exposures. Patient now feels better. No evidence of myoglobin breakdown patient is not acidotic urinalysis now very concentrated no leukocytosis no anemia no electrolyte abnormalities. Renal function is normal.  I personally performed the services described in this documentation, which was scribed in my presence. The recorded information has been reviewed and considered.        Jacob Jakes, MD 06/29/12 [phone]

## 2012-06-29 NOTE — ED Notes (Signed)
Pt given zofran 4 mg iv by ems prior to arrival

## 2012-06-29 NOTE — Discharge Instructions (Signed)
Heat Illness Heat exhaustion happens when the body loses too much water through sweating. This can lead to heat stroke. Heat stroke is a medical emergency. People who work in [employer], athletes, and older people are at greater risk for suffering from heat illness. SYMPTOMS   Exhaustion.   Dizziness.   Fainting.   Muscle cramps.   Nausea.   Vomiting.   Chills and goose bumps.  PREVENTION   You must drink increased amounts of water or other clear liquids during hot weather to prevent heat illness.   This is especially true if you work or do vigorous exercise in the heat. Up to a gallon of sweat can be lost every hour extremely hot or humid conditions.   You will stay cooler by reducing your effort and by dousing yourself with water often.   Certain drugs increase the risk of heat illness because they reduce sweating. These include antidepressants and antihistamines.   Be more cautious during hot weather.   Drink several glasses of water before, during, and after vigorous activity.  SEEK MEDICAL CARE IF:  You have any heat-related problems. Document Released: 12/15/2004 Document Revised: 10/27/2011 Document Reviewed: 08/07/2008 [facility] Patient Information 2012 [place], [place].  Rest today and tomorrow important to avoid high heat for the next few days. Work note provided if needed. Takes Zofran as needed for the nausea. Return for any newer worse symptoms or followup with your record Dr. Berneda Rose to hydrate well.

## 2012-08-02 ENCOUNTER — Encounter: Payer: Self-pay | Admitting: Cardiology

## 2012-08-02 ENCOUNTER — Ambulatory Visit (INDEPENDENT_AMBULATORY_CARE_PROVIDER_SITE_OTHER): Payer: BC Managed Care – PPO | Admitting: Cardiology

## 2012-08-02 VITALS — BP 127/77 | HR 68 | Ht 75.0 in | Wt 210.0 lb

## 2012-08-02 DIAGNOSIS — R42 Dizziness and giddiness: Secondary | ICD-10-CM | POA: Insufficient documentation

## 2012-08-02 DIAGNOSIS — R011 Cardiac murmur, unspecified: Secondary | ICD-10-CM | POA: Insufficient documentation

## 2012-08-02 DIAGNOSIS — I10 Essential (primary) hypertension: Secondary | ICD-10-CM

## 2012-08-02 NOTE — Progress Notes (Signed)
Clinical Summary Jacob Hayes is a 31 y.o.male referred for cardiology consultation by Dr. Cyndia Bent. History was reviewed, including long-standing hypertension. Most recently he has been treated with Hyzaar, until switched to Benicar related to concerns about relative dehydration. From a symptom perspective he describes an episode of what sounds like heat exhaustion that occurred back during the late summer months while he was outdoors working in the intense heat. Since that time he describes a general feeling of episodic vertigo associated with nausea and diaphoresis. This seems to be worse if he turns his head from one side to the other quickly, during which time he feels like he has to "refocus" and that his "eyes cross." He has had no chest pain, no syncope since his original bout with heat exhaustion. In general he describes himself as being active, previously an athlete in school.  ECG from 8/13 reviewed showing sinus bradycardia with sinus arrhythmia, early repolarization. Recent labwork reviewed with Hgb 15.0, BUN 12, creatinine 0.8, potassium 4.3, WBC 4.0, CK 161.  States he has a brother with a bicuspid aortic valve. He does not report any prior personal cardiac testing. Does have a history of migraines, although denies any recent headaches. No focal motor weakness, or speech deficits.  Meclizine has not helped his symptoms.  No Known Allergies  Current Outpatient Prescriptions  Medication Sig Dispense Refill  . fluticasone (FLONASE) 50 MCG/ACT nasal spray Place 2 sprays into the nose daily.  16 g  11  . meclizine (ANTIVERT) 25 MG tablet Take 25 mg by mouth 3 (three) times daily as needed.      Marland Kitchen olmesartan (BENICAR) 40 MG tablet Take 40 mg by mouth daily.      . pantoprazole (PROTONIX) 40 MG tablet Take 40 mg by mouth at bedtime.      Marland Kitchen PARoxetine (PAXIL) 20 MG tablet Take 10 mg by mouth at bedtime.      . simvastatin (ZOCOR) 40 MG tablet Take 40 mg by mouth at bedtime.      . SUMAtriptan  (IMITREX) 100 MG tablet Take 1 tablet (100 mg total) by mouth every 2 (two) hours as needed for migraine.  9 tablet  5    Past Medical History  Diagnosis Date  . Migraines   . Allergic rhinitis   . Anxiety and depression   . GERD   . Hyperlipidemia     Since age 1  . Essential hypertension, benign   . Bruxism     History reviewed. No pertinent past surgical history.  Family History  Problem Relation Age of Onset  . Breast cancer Mother   . Thyroid disease Mother     Thyroid removed  . Heart disease Father   . Stroke Father   . Hyperlipidemia Father   . Valvular heart disease Brother     Bicuspid aortic valve    Social History Jacob Hayes reports that he has never smoked. He does not have any smokeless tobacco history on file. Jacob Hayes reports that he does not drink alcohol.  Review of Systems No sense of palpitations. States he generally feels well with the exception of these recent events.  Physical Examination Filed Vitals:   08/02/12 1448  BP: 127/77  Pulse: 68   Filed Weights   08/02/12 1356  Weight: 210 lb (95.255 kg)   Well-developed, in NAD. HEENT: Conjunctiva and lids normal, oropharynx clear. Neck: Supple, no elevated JVP or carotid bruits, no thyromegaly. Lungs: Clear to auscultation, nonlabored breathing at rest.  Cardiac: Regular rate and rhythm, no S3, 1/6 systolic murmur at base, no pericardial rub. Soft S4. Abdomen: Soft, nontender, no bruits, bowel sounds present, no guarding or rebound. Extremities: No pitting edema, distal pulses 2+. Skin: Warm and dry. Musculoskeletal: No kyphosis. Neuropsychiatric: Alert and oriented x3, affect grossly appropriate.   Problem List and Plan   Dizziness More specifically, he is describing vertigo with associated nausea and diaphoresis. This can last for several minutes to hours at a time. He does not describe any associated headache with these symptoms, states that it seems to be worse when he turns or  changes positions quickly, sometimes with some brief visual refocusing. No description of chest pain on exertion or palpitations. He does have a family history of CAD, also bicuspid aortic valve and his brother. Baseline ECG reviewed. He was not orthostatic in clinic today. Our plan is to pursue a basic treadmill test to exclude any inducible arrhythmias and also ischemia, although suspect this is not the cause of the symptoms however. He may actually need further ENT or neurological evaluation.  Heart murmur Systolic murmur at the base. History of bicuspid aortic valve in his brother, an echocardiogram will be obtained.  Essential hypertension, benign Not entirely certain whether he had workup for secondary causes of hypertension over the years. He is now followed by Dr. Cyndia Bent. States he was tolerating Hyzaar, and now Benicar reasonably well.    Jonelle Sidle, M.D., F.A.C.C.

## 2012-08-02 NOTE — Assessment & Plan Note (Signed)
Systolic murmur at the base. History of bicuspid aortic valve in his brother, an echocardiogram will be obtained.

## 2012-08-02 NOTE — Assessment & Plan Note (Signed)
Not entirely certain whether he had workup for secondary causes of hypertension over the years. He is now followed by Dr. Cyndia Bent. States he was tolerating Hyzaar, and now Benicar reasonably well.

## 2012-08-02 NOTE — Patient Instructions (Addendum)
Your physician recommends that you schedule a follow-up appointment in: 2 weeks  Your physician has requested that you have an echocardiogram. Echocardiography is a painless test that uses sound waves to create images of your heart. It provides your doctor with information about the size and shape of your heart and how well your heart's chambers and valves are working. This procedure takes approximately one hour. There are no restrictions for this procedure.  Treadmill Stress Test

## 2012-08-02 NOTE — Assessment & Plan Note (Signed)
More specifically, he is describing vertigo with associated nausea and diaphoresis. This can last for several minutes to hours at a time. He does not describe any associated headache with these symptoms, states that it seems to be worse when he turns or changes positions quickly, sometimes with some brief visual refocusing. No description of chest pain on exertion or palpitations. He does have a family history of CAD, also bicuspid aortic valve and his brother. Baseline ECG reviewed. He was not orthostatic in clinic today. Our plan is to pursue a basic treadmill test to exclude any inducible arrhythmias and also ischemia, although suspect this is not the cause of the symptoms however. He may actually need further ENT or neurological evaluation.

## 2012-08-03 ENCOUNTER — Ambulatory Visit (INDEPENDENT_AMBULATORY_CARE_PROVIDER_SITE_OTHER): Payer: BC Managed Care – PPO | Admitting: Cardiology

## 2012-08-03 ENCOUNTER — Ambulatory Visit (HOSPITAL_COMMUNITY)
Admission: RE | Admit: 2012-08-03 | Discharge: 2012-08-03 | Disposition: A | Discharge status: Home / Self Care | Payer: BC Managed Care – PPO | Source: Ambulatory Visit | Attending: Cardiology | Admitting: Cardiology

## 2012-08-03 DIAGNOSIS — R42 Dizziness and giddiness: Secondary | ICD-10-CM

## 2012-08-03 DIAGNOSIS — R011 Cardiac murmur, unspecified: Secondary | ICD-10-CM

## 2012-08-03 DIAGNOSIS — E785 Hyperlipidemia, unspecified: Secondary | ICD-10-CM | POA: Insufficient documentation

## 2012-08-03 DIAGNOSIS — I10 Essential (primary) hypertension: Secondary | ICD-10-CM | POA: Insufficient documentation

## 2012-08-03 NOTE — Progress Notes (Signed)
Stress Lab Nurses Notes - Jacob Hayes 08/03/2012 Reason for doing test: Syncope Type of test: Regular GTX Nurse performing test: Parke Poisson, RN Nuclear Medicine Tech: Not Applicable Echo Tech: Not Applicable MD performing test: Ival Bible & Joni Reining NP Family MD: Oliver Barre Test explained and consent signed: yes IV started: No IV started Symptoms: Fatigue & Mild SOB Treatment/Intervention: None Reason test stopped: reached target HR and SOB After recovery IV was: NA Patient to return to Nuc. Med at : NA Patient discharged: Home Patient's Condition upon discharge was: stable Comments: During test Peak BP 182/107  HR 187.  Recovery BP 147/63 & HR.  Symptoms resolved in recovery.  Had some dizziness prior to starting the test & continues to have some in recovery. Erskine Speed T

## 2012-08-03 NOTE — Progress Notes (Signed)
Attending note:  Baseline ECG shows sinus rhythm with early repolarization. Patient exercised on Bruce protocol for 11 minutes and 15 seconds achieving a maximum workload of 13.4 METs. Peak heart rate was 187 beats per minute, achieving 99% of the maximum age predicted heart rate response. Peak blood pressure was 185/107. There were no diagnostic ST segment changes to indicate ischemia. Occasional PVCs noted. No sustained arrhythmias. Hypertensive response to exercise demonstrated. No anginal chest pain was reported. Overall negative study for ischemia.  Jonelle Sidle, M.D., F.A.C.C.

## 2012-08-03 NOTE — Progress Notes (Signed)
*  PRELIMINARY RESULTS* Echocardiogram 2D Echocardiogram has been performed.  Nestor Ramp M 08/03/2012, 10:16 AM

## 2012-08-17 ENCOUNTER — Ambulatory Visit: Payer: Self-pay | Admitting: Cardiology

## 2012-11-28 ENCOUNTER — Other Ambulatory Visit: Payer: Self-pay | Admitting: Internal Medicine

## 2013-03-06 ENCOUNTER — Encounter: Payer: Self-pay | Admitting: Gastroenterology

## 2013-03-06 ENCOUNTER — Ambulatory Visit (INDEPENDENT_AMBULATORY_CARE_PROVIDER_SITE_OTHER): Payer: BC Managed Care – PPO | Admitting: Gastroenterology

## 2013-03-06 VITALS — BP 136/83 | HR 89 | Temp 97.9°F | Ht 75.0 in | Wt 209.6 lb

## 2013-03-06 DIAGNOSIS — R6889 Other general symptoms and signs: Secondary | ICD-10-CM

## 2013-03-06 DIAGNOSIS — K219 Gastro-esophageal reflux disease without esophagitis: Secondary | ICD-10-CM

## 2013-03-06 DIAGNOSIS — R110 Nausea: Secondary | ICD-10-CM | POA: Insufficient documentation

## 2013-03-06 DIAGNOSIS — R7989 Other specified abnormal findings of blood chemistry: Secondary | ICD-10-CM

## 2013-03-06 DIAGNOSIS — R140 Abdominal distension (gaseous): Secondary | ICD-10-CM | POA: Insufficient documentation

## 2013-03-06 DIAGNOSIS — R143 Flatulence: Secondary | ICD-10-CM

## 2013-03-06 DIAGNOSIS — R1013 Epigastric pain: Secondary | ICD-10-CM

## 2013-03-06 MED ORDER — RESTORA PO CAPS: 1.0000 | ORAL_CAPSULE | Freq: Every day | ORAL | Status: DC

## 2013-03-06 MED ORDER — ONDANSETRON HCL 4 MG PO TABS: 4.0000 mg | ORAL_TABLET | Freq: Four times a day (QID) | ORAL | Status: DC | PRN

## 2013-03-06 NOTE — Patient Instructions (Addendum)
Samples of Dexilant provided today in case you run out of pantoprazole while awaiting prior authorization. Take one daily before breakfast. Start a probiotic. Take one Restora capsule daily for four weeks. Samples provided. Prescription for Zofran sent to your pharmacy.  We have scheduled you for an upper endoscopy with Dr. Jena Gauss. Please see separate instructions.

## 2013-03-06 NOTE — Progress Notes (Signed)
Primary Care Physician:  Eartha Inch, MD  Primary Gastroenterologist:  Roetta Sessions, MD   Chief Complaint  Patient presents with  . Gastrophageal Reflux  . Abdominal Pain    HPI:  Jacob Hayes is a 32 y.o. male here for further evaluation of chronic GERD, epigastric pain. Patient has long h/o GERD dating back to age 28. States he developed bad heartburn, hypertension at that time. Actually put on antidepressant because there was question of secondary HTN, GERD related to stress, etc. He has been on multiple hypertensive since then including ACE-I, ARB, Ca channel blockers. Chronically has used Rolaids for GERD. In the past, tried Prevacid, Prilosec, Tagamet, Zantac without relief. Has been on Protonix more recently. Previously on Nexium which seemed to work better but copay was too high. Patient has never had an EGD. His father has Barrett's esophagus.   Over the last several months he has had postprandial bloating/fullness, epigastric pain. Waves of nausea. Doesn't seem to matter what he eats. Typically has a bowel movement 3 times a day. No melena or rectal bleeding. Denies weight loss. Nothing  seems to make his abdominal pain and bloating better. Complains of a lot of gas. Denies dysphagia, odynophagia. His mother has a history of Crohn's disease.   Patient's history is also significant for recurrent episodes of dizziness, feeling of flushing followed by profuse diaphoresis. One episode he lost consciousness. He also develops stiffening of his muscles and contracture of his extremities when this occurs. Episodes began with feeling of overheating from within. He states he does not sweat even in the heat until it is too late and he has overheated to the point of exhaustion.  By this time he is having the above symptoms. He has been treated for heat stroke 4 years ago and heat exhaustion back in August of 2013. In August of last year he had unremarkable CBC and basic metabolic panel. In November  2012, he had a TSH slightly low at 0.30. He has also been diagnosed with a benign brain cyst which according to the patient he was told that he was not symptomatic related to this. On his own patient decided to stop Paxil and simvastatin to see if this would help any of his symptoms. He has been on Xanax temporarily during this maneuver.    Current Outpatient Prescriptions  Medication Sig Dispense Refill  . ALPRAZolam (XANAX) 0.5 MG tablet Take 0.5 mg by mouth at bedtime as needed.       . fluticasone (FLONASE) 50 MCG/ACT nasal spray Place 2 sprays into the nose daily.  16 g  11  . losartan-hydrochlorothiazide (HYZAAR) 100-12.5 MG per tablet Take 1 tablet by mouth daily.       . meclizine (ANTIVERT) 25 MG tablet Take 25 mg by mouth 3 (three) times daily as needed.      . pantoprazole (PROTONIX) 40 MG tablet Take 40 mg by mouth at bedtime.      . SUMAtriptan (IMITREX) 100 MG tablet Take 100 mg by mouth every 2 (two) hours as needed for migraine.       No current facility-administered medications for this visit.    Allergies as of 03/06/2013  . (No Known Allergies)    Past Medical History  Diagnosis Date  . Migraines   . Allergic rhinitis   . Anxiety and depression   . GERD   . Hyperlipidemia     Since age 55  . Essential hypertension, benign   . Bruxism  Past Surgical History  Procedure Laterality Date  . None      Family History  Problem Relation Age of Onset  . Breast cancer Mother   . Thyroid disease Mother     Thyroid removed  . Heart disease Father   . Stroke Father   . Hyperlipidemia Father   . Valvular heart disease Brother     Bicuspid aortic valve  . Lung cancer Mother     History   Social History  . Marital Status: Married    Spouse Name: N/A    Number of Children: 2  . Years of Education: N/A   Occupational History  . High school football coach    Social History Main Topics  . Smoking status: Never Smoker   . Smokeless tobacco: Not on file  .  Alcohol Use: No  . Drug Use: No  . Sexually Active: Not on file   Other Topics Concern  . Not on file   Social History Narrative  . No narrative on file      ROS:  General: Negative for anorexia, weight loss, fever, chills, fatigue, weakness. Eyes: Negative for vision changes.  ENT: Negative for hoarseness, difficulty swallowing , nasal congestion. CV: Negative for chest pain, angina, palpitations, dyspnea on exertion, peripheral edema.  Respiratory: Negative for dyspnea at rest, dyspnea on exertion, cough, sputum, wheezing.  GI: See history of present illness. GU:  Negative for dysuria, hematuria, urinary incontinence, urinary frequency, nocturnal urination.  MS: Negative for joint pain, low back pain.  Derm: Negative for rash or itching.  Neuro: Negative for weakness, abnormal sensation, seizure, frequent headaches, memory loss, confusion. See history of present illness Psych: Positive for anxiety, depression. Negative for suicidal ideation, hallucinations.  Endo: Negative for unusual weight change.  Heme: Negative for bruising or bleeding. Allergy: Negative for rash or hives.    Physical Examination:  BP 136/83  Pulse 89  Temp(Src) 97.9 F (36.6 C) (Oral)  Ht 6\' 3"  (1.905 m)  Wt 209 lb 9.6 oz (95.074 kg)  BMI 26.2 kg/m2   General: Well-nourished, well-developed in no acute distress.  Head: Normocephalic, atraumatic.   Eyes: Conjunctiva pink, no icterus. Mouth: Oropharyngeal mucosa moist and pink , no lesions erythema or exudate. Neck: Supple without thyromegaly, masses, or lymphadenopathy.  Lungs: Clear to auscultation bilaterally.  Heart: Regular rate and rhythm, no murmurs rubs or gallops.  Abdomen: Bowel sounds are normal, mild to moderate epigastric tenderness, nondistended, no hepatosplenomegaly or masses, no abdominal bruits or    hernia , no rebound or guarding.   Rectal: Not performed Extremities: No lower extremity edema. No clubbing or deformities.   Neuro: Alert and oriented x 4 , grossly normal neurologically.  Skin: Warm and dry, no rash or jaundice.   Psych: Alert and cooperative, normal mood and affect.  Labs:OLD LABS Lab Results  Component Value Date   WBC 9.8 06/29/2012   HGB 13.2 06/29/2012   HCT 37.7* 06/29/2012   MCV 87.3 06/29/2012   PLT 208 06/29/2012   Lab Results  Component Value Date   CREATININE 1.02 06/29/2012   BUN 10 06/29/2012   NA 137 06/29/2012   K 3.8 06/29/2012   CL 104 06/29/2012   CO2 26 06/29/2012   Lab Results  Component Value Date   TSH 0.30* 10/19/2011   Lab Results  Component Value Date   ALT 28 10/19/2011   AST 21 10/19/2011   ALKPHOS 61 10/19/2011   BILITOT 0.5 10/19/2011  Imaging Studies: No results found.    

## 2013-03-07 ENCOUNTER — Encounter: Payer: Self-pay | Admitting: Gastroenterology

## 2013-03-07 DIAGNOSIS — R7989 Other specified abnormal findings of blood chemistry: Secondary | ICD-10-CM | POA: Insufficient documentation

## 2013-03-07 NOTE — Assessment & Plan Note (Addendum)
After patient left the office, I discovered that he had low TSH in 2012. Recommend repeating TSH, along with free T4, T3.

## 2013-03-07 NOTE — Assessment & Plan Note (Signed)
32 year old gentleman with history of chronic GERD for nearly 20 years, family history of Barrett's esophagus (father) who presents with several month history of epigastric pain, nausea, poorly controlled GERD. He is on pantoprazole daily. Doesn't seem to matter what he eats. Complains of excessive gas and bloating. No bowel habit changes. No blood in stool or melena. No dysphagia. Discussed with patient today, would highly recommend upper endoscopy for further evaluation of the symptoms as well as for screening for Barrett's esophagus. Consider small bowel biopsy to rule out celiac disease given GI symptoms. Patient's gallbladder remains in situ. If EGD is negative, next step may be abdominal ultrasound.  He needs to remain on a PPI. He tells me that he is about to run out of protonix, awaiting prior authorization. Samples of Dexilant provided for the interim period. Zofran provided for nausea. Trial of Restora (for bloating) one daily for 30 days, samples provided.  Further recommendations to follow.

## 2013-03-07 NOTE — Progress Notes (Signed)
Cc PCP 

## 2013-03-12 ENCOUNTER — Other Ambulatory Visit: Payer: Self-pay | Admitting: Gastroenterology

## 2013-03-12 MED ORDER — PANTOPRAZOLE SODIUM 40 MG PO TBEC: 40.0000 mg | DELAYED_RELEASE_TABLET | Freq: Two times a day (BID) | ORAL | Status: DC

## 2013-03-12 NOTE — Progress Notes (Signed)
Patient requesting prescription for Protonix BID. RX sent to [facility] in [place].

## 2013-03-14 ENCOUNTER — Encounter (HOSPITAL_COMMUNITY): Payer: Self-pay | Admitting: Pharmacy Technician

## 2013-03-19 ENCOUNTER — Telehealth: Payer: Self-pay | Admitting: Gastroenterology

## 2013-03-19 NOTE — Telephone Encounter (Signed)
YesBenedetto Goad wasn't able to add the blood work to the order so Tobi Bastos is going to take the orders to him and have him take it with him to endo.

## 2013-03-19 NOTE — Telephone Encounter (Signed)
Is Jacob Hayes set to get his TSH, free T4, T3 in endo tomorrow?

## 2013-03-19 NOTE — Telephone Encounter (Signed)
Thanks

## 2013-03-20 ENCOUNTER — Ambulatory Visit (HOSPITAL_COMMUNITY)
Admission: RE | Admit: 2013-03-20 | Discharge: 2013-03-20 | Disposition: A | Discharge status: Home / Self Care | Payer: BC Managed Care – PPO | Source: Ambulatory Visit | Attending: Internal Medicine | Admitting: Internal Medicine

## 2013-03-20 ENCOUNTER — Encounter (HOSPITAL_COMMUNITY): Payer: Self-pay | Admitting: *Deleted

## 2013-03-20 ENCOUNTER — Encounter (HOSPITAL_COMMUNITY)
Admission: RE | Disposition: A | Discharge status: Home / Self Care | Payer: Self-pay | Source: Ambulatory Visit | Attending: Internal Medicine

## 2013-03-20 DIAGNOSIS — K209 Esophagitis, unspecified without bleeding: Secondary | ICD-10-CM | POA: Insufficient documentation

## 2013-03-20 DIAGNOSIS — I10 Essential (primary) hypertension: Secondary | ICD-10-CM | POA: Insufficient documentation

## 2013-03-20 DIAGNOSIS — R140 Abdominal distension (gaseous): Secondary | ICD-10-CM

## 2013-03-20 DIAGNOSIS — K2970 Gastritis, unspecified, without bleeding: Secondary | ICD-10-CM | POA: Insufficient documentation

## 2013-03-20 DIAGNOSIS — K219 Gastro-esophageal reflux disease without esophagitis: Secondary | ICD-10-CM

## 2013-03-20 DIAGNOSIS — D131 Benign neoplasm of stomach: Secondary | ICD-10-CM

## 2013-03-20 DIAGNOSIS — R1013 Epigastric pain: Secondary | ICD-10-CM | POA: Insufficient documentation

## 2013-03-20 DIAGNOSIS — R110 Nausea: Secondary | ICD-10-CM

## 2013-03-20 DIAGNOSIS — R933 Abnormal findings on diagnostic imaging of other parts of digestive tract: Secondary | ICD-10-CM

## 2013-03-20 DIAGNOSIS — K2990 Gastroduodenitis, unspecified, without bleeding: Secondary | ICD-10-CM | POA: Insufficient documentation

## 2013-03-20 HISTORY — DX: Anxiety disorder, unspecified: F41.9

## 2013-03-20 HISTORY — PX: BIOPSY: SHX5522

## 2013-03-20 HISTORY — PX: ESOPHAGOGASTRODUODENOSCOPY: SHX5428

## 2013-03-20 SURGERY — EGD (ESOPHAGOGASTRODUODENOSCOPY)
Anesthesia: Moderate Sedation

## 2013-03-20 MED ORDER — MEPERIDINE HCL 100 MG/ML IJ SOLN
INTRAMUSCULAR | Status: AC
  Filled 2013-03-20: qty 1

## 2013-03-20 MED ORDER — MEPERIDINE HCL 100 MG/ML IJ SOLN
INTRAMUSCULAR | Status: DC | PRN
  Administered 2013-03-20 (×4): 50 mg via INTRAVENOUS

## 2013-03-20 MED ORDER — PROMETHAZINE HCL 25 MG/ML IJ SOLN
INTRAMUSCULAR | Status: AC
  Filled 2013-03-20: qty 1

## 2013-03-20 MED ORDER — PROMETHAZINE HCL 25 MG/ML IJ SOLN
INTRAMUSCULAR | Status: DC | PRN
  Administered 2013-03-20: 12.5 mg via INTRAVENOUS

## 2013-03-20 MED ORDER — SODIUM CHLORIDE 0.9 % IJ SOLN
INTRAMUSCULAR | Status: AC
  Filled 2013-03-20: qty 10

## 2013-03-20 MED ORDER — ONDANSETRON HCL 4 MG/2ML IJ SOLN
INTRAMUSCULAR | Status: DC | PRN
  Administered 2013-03-20: 4 mg via INTRAVENOUS

## 2013-03-20 MED ORDER — SODIUM CHLORIDE 0.9 % IV SOLN
INTRAVENOUS | Status: DC
  Administered 2013-03-20: 1000 mL via INTRAVENOUS

## 2013-03-20 MED ORDER — MIDAZOLAM HCL 5 MG/5ML IJ SOLN
INTRAMUSCULAR | Status: AC
  Filled 2013-03-20: qty 10

## 2013-03-20 MED ORDER — STERILE WATER FOR IRRIGATION IR SOLN
Status: DC | PRN
  Administered 2013-03-20: 15:00:00

## 2013-03-20 MED ORDER — BUTAMBEN-TETRACAINE-BENZOCAINE 2-2-14 % EX AERO
INHALATION_SPRAY | CUTANEOUS | Status: DC | PRN
  Administered 2013-03-20: 2 via TOPICAL

## 2013-03-20 MED ORDER — ONDANSETRON HCL 4 MG/2ML IJ SOLN
INTRAMUSCULAR | Status: AC
  Filled 2013-03-20: qty 2

## 2013-03-20 MED ORDER — MIDAZOLAM HCL 5 MG/5ML IJ SOLN
INTRAMUSCULAR | Status: DC | PRN
  Administered 2013-03-20 (×4): 2 mg via INTRAVENOUS

## 2013-03-20 NOTE — Progress Notes (Signed)
Labs drawn as ordered from the office,  TSH, T3, T4 free.

## 2013-03-20 NOTE — Op Note (Signed)
[facility] [address] [place], [address]   ENDOSCOPY PROCEDURE REPORT  PATIENT: Jacob, Hayes  MR#: [id number] BIRTHDATE: 1981/01/16 , 32  yrs. old GENDER: Male ENDOSCOPIST: R.  Roetta Sessions, MD FACP FACG REFERRED BY:  Italy Badger, M.D. PROCEDURE DATE:  03/20/2013 PROCEDURE:     EGD with esophageal, gastric and duodenal biopsy  INDICATIONS:    Epigastric pain refractory to acid suppression therapy  INFORMED CONSENT:   The risks, benefits, limitations, alternatives and imponderables have been discussed.  The potential for biopsy, esophogeal dilation, etc. have also been reviewed.  Questions have been answered.  All parties agreeable.  Please see the history and physical in the medical record for more information.  MEDICATIONS:    Versed 8 mg IV and Demerol 200 mg IV and Phenergan 12.5 mg Zofran 4 mg. Cetacaine spray  DESCRIPTION OF PROCEDURE:   The XB-1478G ([id number])  endoscope was introduced through the mouth and advanced to the second portion of the duodenum without difficulty or limitations.  The mucosal surfaces were surveyed very carefully during advancement of the scope and upon withdrawal.  Retroflexion view of the proximal stomach and esophagogastric junction was performed.      FINDINGS:  Distal esophageal mucosa appeared normal. No esophagitis or evidence of Barrett's esophagus. In the upper esophagus, through the upper esophageal sphincter, there were multiple areas of salmon-colored; epithelium consistent with inlet patches. One had a slightly nodular appearance. Please see photos. Stomach empty. Small hiatal hernia present. Multple fudal gland -appearing polyps 1-2 mm in dimensions. No ulcer or infiltrating process. Pylorus patent. Examination of bulb second and third portion revealed granularity of the bulb itself; no ulcer or other abnormality seen.  THERAPEUTIC / DIAGNOSTIC MANEUVERS PERFORMED:  Biopsies of the proximal esophagus  as well as the mid and distal (normal appearing) esophagus taken for histologic study. One of the fundal gland polyps was biopsied. Antral biopsy taken for HP. Finally, biopsies of the bulb and second portion of the duodenum were taken for histologic study.   COMPLICATIONS:  None  IMPRESSION:  Abnormal esophagus of uncertain significance status post biopsy. Status post gastric polyp biopsy and duodenal biopsy  RECOMMENDATIONS:  Continue Protonix 40 mg orally twice daily. Followup on pathology. Proceed with abdominal ultrasound.    _______________________________ R. Roetta Sessions, MD FACP [facility] eSigned:  R. Roetta Sessions, MD FACP [facility] 03/20/2013 4:26 PM     CC:  PATIENT NAME:  Jacob, Hayes MR#: [id number]
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
## 2013-03-20 NOTE — Interval H&P Note (Signed)
History and Physical Interval Note:  03/20/2013 3:22 PM  Jacob Hayes  has presented today for surgery, with the diagnosis of Epigastric Pain, Nausea, Chronic GERD, Bloating  The various methods of treatment have been discussed with the patient and family. After consideration of risks, benefits and other options for treatment, the patient has consented to  Procedure(s) with comments: ESOPHAGOGASTRODUODENOSCOPY (EGD) (N/A) - 2:45 BIOPSY (N/A) - Possible small bowel biopsy as a surgical intervention .  The patient's history has been reviewed, patient examined, no change in status, stable for surgery.  I have reviewed the patient's chart and labs.  Questions were answered to the patient's satisfaction.     Eula Listen  As above. EGD per plan.  The risks, benefits, limitations, alternatives and imponderables have been reviewed with the patient. Potential for esophageal dilation, biopsy, etc. have also been reviewed.  Questions have been answered. All parties agreeable.

## 2013-03-21 ENCOUNTER — Encounter (HOSPITAL_COMMUNITY): Payer: Self-pay | Admitting: Internal Medicine

## 2013-03-26 NOTE — Progress Notes (Signed)
[facility] CC TO PCP

## 2013-04-09 NOTE — Progress Notes (Signed)
Quick Note:  Discussed with patient's wife. Subclinical hyperthyroidism. He has had prior thyroid u/s in [place] reportedly normal without nodules. Symptoms of heat intolerance, hand contractures with severe episodes. Currently awaiting input from neurosurgeon regarding brain cyst. Consider consultation with endocrinology vs repeat TSH, Free T4, T3 in six months. They will let me know which way they want to go. ______

## 2013-06-03 ENCOUNTER — Telehealth: Payer: Self-pay | Admitting: Gastroenterology

## 2013-06-03 ENCOUNTER — Other Ambulatory Visit: Payer: Self-pay | Admitting: Gastroenterology

## 2013-06-03 DIAGNOSIS — R7989 Other specified abnormal findings of blood chemistry: Secondary | ICD-10-CM

## 2013-06-03 NOTE — Telephone Encounter (Signed)
Discussed with Tobi Bastos. Luisa Hart is requesting referral to endocrinology due to LOW TSH on multiple occasions.   Please send referral to Dr. Fransico Him. Send copy of all TSH, T3, T4 from 09/2011 to present. Send copy of my office note. Thanks.

## 2013-06-03 NOTE — Telephone Encounter (Signed)
Referral has been faxed to Dr. Nida 

## 2013-06-05 NOTE — Progress Notes (Signed)
REVIEWED.  

## 2013-06-05 NOTE — Telephone Encounter (Signed)
Patient has an appt 06/07/2013@2 :00pm.  Pts wife is aware of appt

## 2013-06-07 ENCOUNTER — Other Ambulatory Visit (HOSPITAL_COMMUNITY): Payer: Self-pay | Admitting: "Endocrinology

## 2013-06-07 DIAGNOSIS — R7989 Other specified abnormal findings of blood chemistry: Secondary | ICD-10-CM

## 2013-06-12 ENCOUNTER — Encounter (HOSPITAL_COMMUNITY)
Admission: RE | Admit: 2013-06-12 | Discharge: 2013-06-12 | Disposition: A | Discharge status: Home / Self Care | Payer: BC Managed Care – PPO | Source: Ambulatory Visit | Attending: "Endocrinology | Admitting: "Endocrinology

## 2013-06-12 ENCOUNTER — Encounter (HOSPITAL_COMMUNITY): Payer: Self-pay

## 2013-06-12 DIAGNOSIS — R7989 Other specified abnormal findings of blood chemistry: Secondary | ICD-10-CM

## 2013-06-12 DIAGNOSIS — R946 Abnormal results of thyroid function studies: Secondary | ICD-10-CM | POA: Insufficient documentation

## 2013-06-12 MED ORDER — SODIUM IODIDE I 131 CAPSULE
15.0000 | Freq: Once | INTRAVENOUS | Status: AC | PRN
  Administered 2013-06-12: 15 via ORAL

## 2013-06-13 ENCOUNTER — Encounter (HOSPITAL_COMMUNITY)
Admission: RE | Admit: 2013-06-13 | Discharge: 2013-06-13 | Disposition: A | Discharge status: Home / Self Care | Payer: BC Managed Care – PPO | Source: Ambulatory Visit | Attending: "Endocrinology | Admitting: "Endocrinology

## 2013-06-13 DIAGNOSIS — R946 Abnormal results of thyroid function studies: Secondary | ICD-10-CM | POA: Insufficient documentation

## 2013-06-13 MED ORDER — SODIUM PERTECHNETATE TC 99M INJECTION
10.0000 | Freq: Once | INTRAVENOUS | Status: AC | PRN
  Administered 2013-06-13: 10 via INTRAVENOUS

## 2013-08-22 ENCOUNTER — Other Ambulatory Visit (HOSPITAL_COMMUNITY): Payer: Self-pay | Admitting: "Endocrinology

## 2013-08-22 DIAGNOSIS — G930 Cerebral cysts: Secondary | ICD-10-CM

## 2013-08-29 ENCOUNTER — Ambulatory Visit (HOSPITAL_COMMUNITY)
Admission: RE | Admit: 2013-08-29 | Discharge: 2013-08-29 | Disposition: A | Discharge status: Home / Self Care | Payer: BC Managed Care – PPO | Source: Ambulatory Visit | Attending: "Endocrinology | Admitting: "Endocrinology

## 2013-08-29 ENCOUNTER — Other Ambulatory Visit (HOSPITAL_COMMUNITY): Payer: Self-pay

## 2013-08-29 DIAGNOSIS — R110 Nausea: Secondary | ICD-10-CM | POA: Insufficient documentation

## 2013-08-29 DIAGNOSIS — R42 Dizziness and giddiness: Secondary | ICD-10-CM | POA: Insufficient documentation

## 2013-08-29 DIAGNOSIS — G930 Cerebral cysts: Secondary | ICD-10-CM

## 2013-08-29 DIAGNOSIS — R209 Unspecified disturbances of skin sensation: Secondary | ICD-10-CM | POA: Insufficient documentation

## 2013-08-29 MED ORDER — GADOBENATE DIMEGLUMINE 529 MG/ML IV SOLN
19.0000 mL | Freq: Once | INTRAVENOUS | Status: AC | PRN
  Administered 2013-08-29: 19 mL via INTRAVENOUS

## 2013-09-26 ENCOUNTER — Other Ambulatory Visit: Payer: Self-pay

## 2013-10-15 ENCOUNTER — Telehealth: Payer: Self-pay

## 2013-10-15 MED ORDER — PANTOPRAZOLE SODIUM 40 MG PO TBEC: 40.0000 mg | DELAYED_RELEASE_TABLET | Freq: Two times a day (BID) | ORAL | Status: DC

## 2013-10-15 NOTE — Telephone Encounter (Signed)
PA for protonix bid is approved until 02/2014.

## 2013-10-15 NOTE — Telephone Encounter (Signed)
rx sent to pharmacy

## 2013-10-15 NOTE — Telephone Encounter (Signed)
Yes, please.

## 2013-10-15 NOTE — Telephone Encounter (Signed)
pts wife Tobi Bastos) called- pt is out of protonix. Is it ok to send in refills?

## 2013-10-29 ENCOUNTER — Encounter: Payer: Self-pay | Admitting: Internal Medicine

## 2013-12-12 ENCOUNTER — Other Ambulatory Visit: Payer: Self-pay | Admitting: Gastroenterology

## 2013-12-12 MED ORDER — PANTOPRAZOLE SODIUM 40 MG PO TBEC: 40.0000 mg | DELAYED_RELEASE_TABLET | Freq: Two times a day (BID) | ORAL | Status: DC

## 2014-05-20 ENCOUNTER — Other Ambulatory Visit: Payer: Self-pay | Admitting: *Deleted

## 2014-05-20 MED ORDER — PANTOPRAZOLE SODIUM 40 MG PO TBEC: 40.0000 mg | DELAYED_RELEASE_TABLET | Freq: Two times a day (BID) | ORAL | Status: DC

## 2015-06-03 ENCOUNTER — Telehealth: Payer: Self-pay | Admitting: Gastroenterology

## 2015-06-03 MED ORDER — PANTOPRAZOLE SODIUM 40 MG PO TBEC: 40.0000 mg | DELAYED_RELEASE_TABLET | Freq: Two times a day (BID) | ORAL | Status: DC

## 2015-06-03 NOTE — Telephone Encounter (Signed)
Pt is aware.  

## 2015-06-03 NOTE — Telephone Encounter (Signed)
Patient requesting refills. Approved for six months but needs RX for future refills. Last seen in 02/2013.   Other option is to get RX from PCP.

## 2016-03-01 ENCOUNTER — Other Ambulatory Visit: Payer: Self-pay | Admitting: Gastroenterology

## 2016-05-19 ENCOUNTER — Other Ambulatory Visit: Payer: Self-pay | Admitting: Gastroenterology

## 2016-05-20 NOTE — Telephone Encounter (Signed)
I can send in 2 months of refills, however we have not seen the patient since 2014 and will need a follow-up visit to be able to Rx more refills.

## 2016-05-23 NOTE — Telephone Encounter (Signed)
Tried to call pt and number disconnected. Mailed letter for him to call and schedule appt or no more refills.

## 2017-02-28 ENCOUNTER — Other Ambulatory Visit: Payer: Self-pay | Admitting: Nurse Practitioner

## 2017-02-28 NOTE — Telephone Encounter (Signed)
[name], I cannot refill RX for patient since he has not been seen in four years. Are you willing to refill? This is David Stall.

## 2017-03-01 NOTE — Telephone Encounter (Signed)
Noted. Will give him one 30 day supply Protonix 40 mg twice daily. No refills. At the same time, we'll get him in for an office visit in the next month or so. To see me, EG or LSL.

## 2017-03-03 ENCOUNTER — Encounter: Payer: Self-pay | Admitting: Internal Medicine

## 2017-03-27 ENCOUNTER — Ambulatory Visit: Payer: Self-pay | Admitting: Gastroenterology

## 2018-05-31 ENCOUNTER — Ambulatory Visit: Payer: Self-pay | Admitting: Gastroenterology

## 2018-06-14 ENCOUNTER — Other Ambulatory Visit: Payer: Self-pay

## 2018-06-14 ENCOUNTER — Ambulatory Visit (INDEPENDENT_AMBULATORY_CARE_PROVIDER_SITE_OTHER): Payer: Commercial Managed Care - PPO | Admitting: Gastroenterology

## 2018-06-14 ENCOUNTER — Encounter: Payer: Self-pay | Admitting: Gastroenterology

## 2018-06-14 ENCOUNTER — Telehealth: Payer: Self-pay

## 2018-06-14 DIAGNOSIS — R198 Other specified symptoms and signs involving the digestive system and abdomen: Secondary | ICD-10-CM

## 2018-06-14 DIAGNOSIS — K625 Hemorrhage of anus and rectum: Secondary | ICD-10-CM | POA: Diagnosis not present

## 2018-06-14 MED ORDER — CLENPIQ 10-3.5-12 MG-GM -GM/160ML PO SOLN: 1.0000 | Freq: Once | ORAL | 0 refills | Status: AC

## 2018-06-14 NOTE — Patient Instructions (Signed)
1. Colonoscopy as scheduled. See separate instructions.  

## 2018-06-14 NOTE — Progress Notes (Signed)
Primary Care Physician:  Chesley Noon, MD  Primary Gastroenterologist:  Garfield Cornea, MD   Chief Complaint  Patient presents with  . Rectal Bleeding    holding spot for TCS 07/05/18    HPI:  Jacob Hayes is a 37 y.o. male here for further evaluation of rectal bleeding and change in bowel habits.  For the past several months he has noted increased stool frequency, stools looser than usual.  Stools are especially looser when he notes blood in the stool.  Passes bright red blood per rectum small to moderate in volume, at least 5 days out of the week.  He denies abdominal pain or rectal pain. He has tried hemorrhoid suppositories for 10 days without any relief in the bleeding.  Appetite is good.  No unintentional weight loss.  Heartburn is well controlled as long as he takes his pantoprazole.  No dysphagia.  No prior colonoscopy.  Current Outpatient Medications  Medication Sig Dispense Refill  . Ascorbic Acid (VITAMIN C) 1000 MG tablet Take 1,000 mg by mouth daily.    Marland Kitchen b complex vitamins capsule Take 1 capsule by mouth daily.    . diazepam (VALIUM) 5 MG tablet Take 5 mg by mouth every 6 (six) hours as needed. for anxiety  1  . fluticasone (FLONASE) 50 MCG/ACT nasal spray Place 2 sprays into the nose daily. (Patient taking differently: Place 2 sprays into the nose as needed. ) 16 g 11  . loratadine (CLARITIN) 10 MG tablet Take 10 mg by mouth daily as needed for allergies.    Marland Kitchen losartan-hydrochlorothiazide (HYZAAR) 100-12.5 MG per tablet Take 1 tablet by mouth daily.     . Magnesium 250 MG TABS Take by mouth 2 (two) times daily.    . meclizine (ANTIVERT) 25 MG tablet Take 25 mg by mouth 3 (three) times daily as needed for dizziness.     . methimazole (TAPAZOLE) 5 MG tablet Take 1 tablet by mouth daily.    . Multiple Vitamins-Minerals (MENS MULTIVITAMIN PLUS PO) Take by mouth daily.    . ondansetron (ZOFRAN) 4 MG tablet Take 1 tablet (4 mg total) by mouth every 6 (six) hours as needed for  nausea. 30 tablet 0  . pantoprazole (PROTONIX) 40 MG tablet TAKE ONE TABLET BY MOUTH TWICE DAILY BEFORE A MEAL 60 tablet 0  . simvastatin (ZOCOR) 40 MG tablet Take 1 tablet by mouth daily.     No current facility-administered medications for this visit.     Allergies as of 06/14/2018  . (No Known Allergies)    Past Medical History:  Diagnosis Date  . Allergic rhinitis   . Anxiety   . Anxiety and depression   . Bruxism   . Essential hypertension, benign   . GERD   . Hyperlipidemia    Since age 24  . Migraines     Past Surgical History:  Procedure Laterality Date  . BIOPSY N/A 03/20/2013   Procedure: BIOPSY;  Surgeon: [name], MD;  Location: [facility];  Service: Endoscopy;  Laterality: N/A;  Possible small bowel biopsy  . ESOPHAGOGASTRODUODENOSCOPY N/A 03/20/2013   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  One slightly nodular.  Small hiatal hernia.  Multiple fundic gland appearing polyps. no evidence of celiac disease, malignancy, h.pylori, barrett's.   . None      Family History  Problem Relation Age of Onset  . Breast cancer Mother   .  Thyroid disease Mother        Thyroid removed  . Lung cancer Mother   . Crohn's disease Mother        ???  . Heart disease Father   . Stroke Father   . Hyperlipidemia Father   . AAA (abdominal aortic aneurysm) Father   . Valvular heart disease Brother        Bicuspid aortic valve  . Colon cancer Neg Hx     Social History   Socioeconomic History  . Marital status: Married    Spouse name: Not on file  . Number of children: 2  . Years of education: Not on file  . Highest education level: Not on file  Occupational History  . Occupation: works with Sales executive at [employer]  . Financial resource strain: Not on file  . Food insecurity:    Worry: Not on file    Inability: Not on file  .  Transportation needs:    Medical: Not on file    Non-medical: Not on file  Tobacco Use  . Smoking status: Never Smoker  . Smokeless tobacco: Never Used  Substance and Sexual Activity  . Alcohol use: No  . Drug use: No  . Sexual activity: Yes  Lifestyle  . Physical activity:    Days per week: Not on file    Minutes per session: Not on file  . Stress: Not on file  Relationships  . Social connections:    Talks on phone: Not on file    Gets together: Not on file    Attends religious service: Not on file    Active member of club or organization: Not on file    Attends meetings of clubs or organizations: Not on file    Relationship status: Not on file  . Intimate partner violence:    Fear of current or ex partner: Not on file    Emotionally abused: Not on file    Physically abused: Not on file    Forced sexual activity: Not on file  Other Topics Concern  . Not on file  Social History Narrative  . Not on file      ROS:  General: Negative for anorexia, weight loss, fever, chills, fatigue, weakness. Eyes: Negative for vision changes.  ENT: Negative for hoarseness, difficulty swallowing , nasal congestion. CV: Negative for chest pain, angina, palpitations, dyspnea on exertion, peripheral edema.  Respiratory: Negative for dyspnea at rest, dyspnea on exertion, cough, sputum, wheezing.  GI: See history of present illness. GU:  Negative for dysuria, hematuria, urinary incontinence, urinary frequency, nocturnal urination.  MS: Negative for joint pain, low back pain.  Derm: Negative for rash or itching.  Neuro: Negative for weakness, abnormal sensation, seizure, frequent headaches, memory loss, confusion.  Psych: Negative for anxiety, depression, suicidal ideation, hallucinations.  Endo: Negative for unusual weight change.  Heme: Negative for bruising or bleeding. Allergy: Negative for rash or hives.    Physical Examination:  BP 135/80   Pulse 86   Temp 98.6 F (37 C) (Oral)    Ht 6\' 3"  (1.905 m)   Wt 230 lb 6.4 oz (104.5 kg)   BMI 28.80 kg/m    General: Well-nourished, well-developed in no acute distress.  Head: Normocephalic, atraumatic.   Eyes: Conjunctiva pink, no icterus. Mouth: Oropharyngeal mucosa moist and pink , no lesions erythema or exudate. Neck: Supple without thyromegaly, masses, or lymphadenopathy.  Lungs: Clear to auscultation bilaterally.  Heart: Regular rate and rhythm, no  murmurs rubs or gallops.  Abdomen: Bowel sounds are normal, nontender, nondistended, no hepatosplenomegaly or masses, no abdominal bruits or    hernia , no rebound or guarding.   Rectal: deferred Extremities: No lower extremity edema. No clubbing or deformities.  Neuro: Alert and oriented x 4 , grossly normal neurologically.  Skin: Warm and dry, no rash or jaundice.   Psych: Alert and cooperative, normal mood and affect.

## 2018-06-14 NOTE — Telephone Encounter (Signed)
PA for TCS submitted via [employer] website. 

## 2018-06-14 NOTE — Assessment & Plan Note (Signed)
Very pleasant 37 year old gentleman presenting with several month history of change in bowel habits (loose stool) associated with rectal bleeding.  Symptoms intermittent but several days weekly.  No abdominal pain or rectal pain.  Differential diagnosis includes hemorrhoids, IBD, less likely malignancy.  Would offer a colonoscopy with deep sedation for further evaluation.  Previously required high-dose conscious sedation at time of EGD in 2014.   I have discussed the risks, alternatives, benefits with regards to but not limited to the risk of reaction to medication, bleeding, infection, perforation and the patient is agreeable to proceed. Written consent to be obtained.

## 2018-06-14 NOTE — H&P (View-Only) (Signed)
Primary Care Physician:  Chesley Noon, MD  Primary Gastroenterologist:  Garfield Cornea, MD   Chief Complaint  Patient presents with  . Rectal Bleeding    holding spot for TCS 07/05/18    HPI:  Jacob Hayes is a 37 y.o. male here for further evaluation of rectal bleeding and change in bowel habits.  For the past several months he has noted increased stool frequency, stools looser than usual.  Stools are especially looser when he notes blood in the stool.  Passes bright red blood per rectum small to moderate in volume, at least 5 days out of the week.  He denies abdominal pain or rectal pain. He has tried hemorrhoid suppositories for 10 days without any relief in the bleeding.  Appetite is good.  No unintentional weight loss.  Heartburn is well controlled as long as he takes his pantoprazole.  No dysphagia.  No prior colonoscopy.  Current Outpatient Medications  Medication Sig Dispense Refill  . Ascorbic Acid (VITAMIN C) 1000 MG tablet Take 1,000 mg by mouth daily.    Marland Kitchen b complex vitamins capsule Take 1 capsule by mouth daily.    . diazepam (VALIUM) 5 MG tablet Take 5 mg by mouth every 6 (six) hours as needed. for anxiety  1  . fluticasone (FLONASE) 50 MCG/ACT nasal spray Place 2 sprays into the nose daily. (Patient taking differently: Place 2 sprays into the nose as needed. ) 16 g 11  . loratadine (CLARITIN) 10 MG tablet Take 10 mg by mouth daily as needed for allergies.    Marland Kitchen losartan-hydrochlorothiazide (HYZAAR) 100-12.5 MG per tablet Take 1 tablet by mouth daily.     . Magnesium 250 MG TABS Take by mouth 2 (two) times daily.    . meclizine (ANTIVERT) 25 MG tablet Take 25 mg by mouth 3 (three) times daily as needed for dizziness.     . methimazole (TAPAZOLE) 5 MG tablet Take 1 tablet by mouth daily.    . Multiple Vitamins-Minerals (MENS MULTIVITAMIN PLUS PO) Take by mouth daily.    . ondansetron (ZOFRAN) 4 MG tablet Take 1 tablet (4 mg total) by mouth every 6 (six) hours as needed for  nausea. 30 tablet 0  . pantoprazole (PROTONIX) 40 MG tablet TAKE ONE TABLET BY MOUTH TWICE DAILY BEFORE A MEAL 60 tablet 0  . simvastatin (ZOCOR) 40 MG tablet Take 1 tablet by mouth daily.     No current facility-administered medications for this visit.     Allergies as of 06/14/2018  . (No Known Allergies)    Past Medical History:  Diagnosis Date  . Allergic rhinitis   . Anxiety   . Anxiety and depression   . Bruxism   . Essential hypertension, benign   . GERD   . Hyperlipidemia    Since age 17  . Migraines     Past Surgical History:  Procedure Laterality Date  . BIOPSY N/A 03/20/2013   Procedure: BIOPSY;  Surgeon: [name], MD;  Location: [facility];  Service: Endoscopy;  Laterality: N/A;  Possible small bowel biopsy  . ESOPHAGOGASTRODUODENOSCOPY N/A 03/20/2013   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  One slightly nodular.  Small hiatal hernia.  Multiple fundic gland appearing polyps. no evidence of celiac disease, malignancy, h.pylori, barrett's.   . None      Family History  Problem Relation Age of Onset  . Breast cancer Mother   .  Thyroid disease Mother        Thyroid removed  . Lung cancer Mother   . Crohn's disease Mother        ???  . Heart disease Father   . Stroke Father   . Hyperlipidemia Father   . AAA (abdominal aortic aneurysm) Father   . Valvular heart disease Brother        Bicuspid aortic valve  . Colon cancer Neg Hx     Social History   Socioeconomic History  . Marital status: Married    Spouse name: Not on file  . Number of children: 2  . Years of education: Not on file  . Highest education level: Not on file  Occupational History  . Occupation: works with Sales executive at [employer]  . Financial resource strain: Not on file  . Food insecurity:    Worry: Not on file    Inability: Not on file  .  Transportation needs:    Medical: Not on file    Non-medical: Not on file  Tobacco Use  . Smoking status: Never Smoker  . Smokeless tobacco: Never Used  Substance and Sexual Activity  . Alcohol use: No  . Drug use: No  . Sexual activity: Yes  Lifestyle  . Physical activity:    Days per week: Not on file    Minutes per session: Not on file  . Stress: Not on file  Relationships  . Social connections:    Talks on phone: Not on file    Gets together: Not on file    Attends religious service: Not on file    Active member of club or organization: Not on file    Attends meetings of clubs or organizations: Not on file    Relationship status: Not on file  . Intimate partner violence:    Fear of current or ex partner: Not on file    Emotionally abused: Not on file    Physically abused: Not on file    Forced sexual activity: Not on file  Other Topics Concern  . Not on file  Social History Narrative  . Not on file      ROS:  General: Negative for anorexia, weight loss, fever, chills, fatigue, weakness. Eyes: Negative for vision changes.  ENT: Negative for hoarseness, difficulty swallowing , nasal congestion. CV: Negative for chest pain, angina, palpitations, dyspnea on exertion, peripheral edema.  Respiratory: Negative for dyspnea at rest, dyspnea on exertion, cough, sputum, wheezing.  GI: See history of present illness. GU:  Negative for dysuria, hematuria, urinary incontinence, urinary frequency, nocturnal urination.  MS: Negative for joint pain, low back pain.  Derm: Negative for rash or itching.  Neuro: Negative for weakness, abnormal sensation, seizure, frequent headaches, memory loss, confusion.  Psych: Negative for anxiety, depression, suicidal ideation, hallucinations.  Endo: Negative for unusual weight change.  Heme: Negative for bruising or bleeding. Allergy: Negative for rash or hives.    Physical Examination:  BP 135/80   Pulse 86   Temp 98.6 F (37 C) (Oral)    Ht 6\' 3"  (1.905 m)   Wt 230 lb 6.4 oz (104.5 kg)   BMI 28.80 kg/m    General: Well-nourished, well-developed in no acute distress.  Head: Normocephalic, atraumatic.   Eyes: Conjunctiva pink, no icterus. Mouth: Oropharyngeal mucosa moist and pink , no lesions erythema or exudate. Neck: Supple without thyromegaly, masses, or lymphadenopathy.  Lungs: Clear to auscultation bilaterally.  Heart: Regular rate and rhythm, no  murmurs rubs or gallops.  Abdomen: Bowel sounds are normal, nontender, nondistended, no hepatosplenomegaly or masses, no abdominal bruits or    hernia , no rebound or guarding.   Rectal: deferred Extremities: No lower extremity edema. No clubbing or deformities.  Neuro: Alert and oriented x 4 , grossly normal neurologically.  Skin: Warm and dry, no rash or jaundice.   Psych: Alert and cooperative, normal mood and affect.

## 2018-06-15 ENCOUNTER — Telehealth: Payer: Self-pay

## 2018-06-15 NOTE — Telephone Encounter (Signed)
Called and informed pt of pre-op appt 06/28/18 at 3:15pm. Letter mailed.

## 2018-06-18 NOTE — Progress Notes (Signed)
CC'D TO PCP °

## 2018-06-20 NOTE — Telephone Encounter (Signed)
Called [facility]. TCS approved. PA# [id number].

## 2018-06-25 NOTE — Patient Instructions (Signed)
Jacob Hayes  06/25/2018     @PREFPERIOPPHARMACY @   Your procedure is scheduled on  07/05/2018   Report to [facility] at  700   A.M.  Call this number if you have problems the morning of surgery:  [phone]   Remember:  Do not eat or drink after midnight.  You may drink clear liquids until  (follow the instructions given to  You) .  Clear liquids allowed are:                    Water, Juice (non-citric and without pulp), Carbonated beverages, Clear Tea, Black Coffee only, Plain Jell-O only, Gatorade and Plain Popsicles only    Take these medicines the morning of surgery with A SIP OF WATER  Zyrtec or claritin, cozaar, methmizole, protonix.    Do not wear jewelry, make-up or nail polish.  Do not wear lotions, powders, or perfumes, or deodorant.  Do not shave 48 hours prior to surgery.  Men may shave face and neck.  Do not bring valuables to the hospital.  [facility] is not responsible for any belongings or valuables.  Contacts, dentures or bridgework may not be worn into surgery.  Leave your suitcase in the car.  After surgery it may be brought to your room.  For patients admitted to the hospital, discharge time will be determined by your treatment team.  Patients discharged the day of surgery will not be allowed to drive home.   Name and phone number of your driver:   family Special instructions:  Follow the instructions given to you by Dr Roseanne Kaufman office.  Please read over the following fact sheets that you were given. Anesthesia Post-op Instructions and Care and Recovery After Surgery       Colonoscopy, Adult A colonoscopy is an exam to look at the large intestine. It is done to check for problems, such as:  Lumps (tumors).  Growths (polyps).  Swelling (inflammation).  Bleeding.  What happens before the procedure? Eating and drinking Follow instructions from your doctor about eating and drinking. These instructions may include:  A few days  before the procedure - follow a low-fiber diet. ? Avoid nuts. ? Avoid seeds. ? Avoid dried fruit. ? Avoid raw fruits. ? Avoid vegetables.  1-3 days before the procedure - follow a clear liquid diet. Avoid liquids that have red or purple dye. Drink only clear liquids, such as: ? Clear broth or bouillon. ? Black coffee or tea. ? Clear juice. ? Clear soft drinks or sports drinks. ? Gelatin dessert. ? Popsicles.  On the day of the procedure - do not eat or drink anything during the 2 hours before the procedure.  Bowel prep If you were prescribed an oral bowel prep:  Take it as told by your doctor. Starting the day before your procedure, you will need to drink a lot of liquid. The liquid will cause you to poop (have bowel movements) until your poop is almost clear or light green.  If your skin or butt gets irritated from diarrhea, you may: ? Wipe the area with wipes that have medicine in them, such as adult wet wipes with aloe and vitamin E. ? Put something on your skin that soothes the area, such as petroleum jelly.  If you throw up (vomit) while drinking the bowel prep, take a break for up to 60 minutes. Then begin the bowel prep again. If you keep  throwing up and you cannot take the bowel prep without throwing up, call your doctor.  General instructions  Ask your doctor about changing or stopping your normal medicines. This is important if you take diabetes medicines or blood thinners.  Plan to have someone take you home from the hospital or clinic. What happens during the procedure?  An IV tube may be put into one of your veins.  You will be given medicine to help you relax (sedative).  To reduce your risk of infection: ? Your doctors will wash their hands. ? Your anal area will be washed with soap.  You will be asked to lie on your side with your knees bent.  Your doctor will get a long, thin, flexible tube ready. The tube will have a camera and a light on the end.  The  tube will be put into your anus.  The tube will be gently put into your large intestine.  Air will be delivered into your large intestine to keep it open. You may feel some pressure or cramping.  The camera will be used to take photos.  A small tissue sample may be removed from your body to be looked at under a microscope (biopsy). If any possible problems are found, the tissue will be sent to a lab for testing.  If small growths are found, your doctor may remove them and have them checked for cancer.  The tube that was put into your anus will be slowly removed. The procedure may vary among doctors and hospitals. What happens after the procedure?  Your doctor will check on you often until the medicines you were given have worn off.  Do not drive for 24 hours after the procedure.  You may have a small amount of blood in your poop.  You may pass gas.  You may have mild cramps or bloating in your belly (abdomen).  It is up to you to get the results of your procedure. Ask your doctor, or the department performing the procedure, when your results will be ready. This information is not intended to replace advice given to you by your health care provider. Make sure you discuss any questions you have with your health care provider. Document Released: 12/10/2010 Document Revised: 09/07/2016 Document Reviewed: 01/19/2016 Elsevier Interactive Patient Education  2017 [employer]  Colonoscopy, Adult, Care After This sheet gives you information about how to care for yourself after your procedure. Your health care provider may also give you more specific instructions. If you have problems or questions, contact your health care provider. What can I expect after the procedure? After the procedure, it is common to have:  A small amount of blood in your stool for 24 hours after the procedure.  Some gas.  Mild abdominal cramping or bloating.  Follow these instructions at home: General  instructions   For the first 24 hours after the procedure: ? Do not drive or use machinery. ? Do not sign important documents. ? Do not drink alcohol. ? Do your regular daily activities at a slower pace than normal. ? Eat soft, easy-to-digest foods. ? Rest often.  Take over-the-counter or prescription medicines only as told by your health care provider.  It is up to you to get the results of your procedure. Ask your health care provider, or the department performing the procedure, when your results will be ready. Relieving cramping and bloating  Try walking around when you have cramps or feel bloated.  Apply heat to your  abdomen as told by your health care provider. Use a heat source that your health care provider recommends, such as a moist heat pack or a heating pad. ? Place a towel between your skin and the heat source. ? Leave the heat on for 20-30 minutes. ? Remove the heat if your skin turns bright red. This is especially important if you are unable to feel pain, heat, or cold. You may have a greater risk of getting burned. Eating and drinking  Drink enough fluid to keep your urine clear or pale yellow.  Resume your normal diet as instructed by your health care provider. Avoid heavy or fried foods that are hard to digest.  Avoid drinking alcohol for as long as instructed by your health care provider. Contact a health care provider if:  You have blood in your stool 2-3 days after the procedure. Get help right away if:  You have more than a small spotting of blood in your stool.  You pass large blood clots in your stool.  Your abdomen is swollen.  You have nausea or vomiting.  You have a fever.  You have increasing abdominal pain that is not relieved with medicine. This information is not intended to replace advice given to you by your health care provider. Make sure you discuss any questions you have with your health care provider. Document Released: 06/21/2004  Document Revised: 08/01/2016 Document Reviewed: 01/19/2016 Elsevier Interactive Patient Education  2018 [place] Anesthesia is a term that refers to techniques, procedures, and medicines that help a person stay safe and comfortable during a medical procedure. Monitored anesthesia care, or sedation, is one type of anesthesia. Your anesthesia specialist may recommend sedation if you will be having a procedure that does not require you to be unconscious, such as:  Cataract surgery.  A dental procedure.  A biopsy.  A colonoscopy.  During the procedure, you may receive a medicine to help you relax (sedative). There are three levels of sedation:  Mild sedation. At this level, you may feel awake and relaxed. You will be able to follow directions.  Moderate sedation. At this level, you will be sleepy. You may not remember the procedure.  Deep sedation. At this level, you will be asleep. You will not remember the procedure.  The more medicine you are given, the deeper your level of sedation will be. Depending on how you respond to the procedure, the anesthesia specialist may change your level of sedation or the type of anesthesia to fit your needs. An anesthesia specialist will monitor you closely during the procedure. Let your health care provider know about:  Any allergies you have.  All medicines you are taking, including vitamins, herbs, eye drops, creams, and over-the-counter medicines.  Any use of steroids (by mouth or as a cream).  Any problems you or family members have had with sedatives and anesthetic medicines.  Any blood disorders you have.  Any surgeries you have had.  Any medical conditions you have, such as sleep apnea.  Whether you are pregnant or may be pregnant.  Any use of cigarettes, alcohol, or street drugs. What are the risks? Generally, this is a safe procedure. However, problems may occur, including:  Getting too much  medicine (oversedation).  Nausea.  Allergic reaction to medicines.  Trouble breathing. If this happens, a breathing tube may be used to help with breathing. It will be removed when you are awake and breathing on your own.  Heart trouble.  Lung trouble.  Before the procedure Staying hydrated Follow instructions from your health care provider about hydration, which may include:  Up to 2 hours before the procedure - you may continue to drink clear liquids, such as water, clear fruit juice, black coffee, and plain tea.  Eating and drinking restrictions Follow instructions from your health care provider about eating and drinking, which may include:  8 hours before the procedure - stop eating heavy meals or foods such as meat, fried foods, or fatty foods.  6 hours before the procedure - stop eating light meals or foods, such as toast or cereal.  6 hours before the procedure - stop drinking milk or drinks that contain milk.  2 hours before the procedure - stop drinking clear liquids.  Medicines Ask your health care provider about:  Changing or stopping your regular medicines. This is especially important if you are taking diabetes medicines or blood thinners.  Taking medicines such as aspirin and ibuprofen. These medicines can thin your blood. Do not take these medicines before your procedure if your health care provider instructs you not to.  Tests and exams  You will have a physical exam.  You may have blood tests done to show: ? How well your kidneys and liver are working. ? How well your blood can clot.  General instructions  Plan to have someone take you home from the hospital or clinic.  If you will be going home right after the procedure, plan to have someone with you for 24 hours.  What happens during the procedure?  Your blood pressure, heart rate, breathing, level of pain and overall condition will be monitored.  An IV tube will be inserted into one of your  veins.  Your anesthesia specialist will give you medicines as needed to keep you comfortable during the procedure. This may mean changing the level of sedation.  The procedure will be performed. After the procedure  Your blood pressure, heart rate, breathing rate, and blood oxygen level will be monitored until the medicines you were given have worn off.  Do not drive for 24 hours if you received a sedative.  You may: ? Feel sleepy, clumsy, or nauseous. ? Feel forgetful about what happened after the procedure. ? Have a sore throat if you had a breathing tube during the procedure. ? Vomit. This information is not intended to replace advice given to you by your health care provider. Make sure you discuss any questions you have with your health care provider. Document Released: 08/03/2005 Document Revised: 04/15/2016 Document Reviewed: 02/28/2016 Elsevier Interactive Patient Education  2018 [place], Care After These instructions provide you with information about caring for yourself after your procedure. Your health care provider may also give you more specific instructions. Your treatment has been planned according to current medical practices, but problems sometimes occur. Call your health care provider if you have any problems or questions after your procedure. What can I expect after the procedure? After your procedure, it is common to:  Feel sleepy for several hours.  Feel clumsy and have poor balance for several hours.  Feel forgetful about what happened after the procedure.  Have poor judgment for several hours.  Feel nauseous or vomit.  Have a sore throat if you had a breathing tube during the procedure.  Follow these instructions at home: For at least 24 hours after the procedure:   Do not: ? Participate in activities in which you could fall or become injured. ?  Drive. ? Use heavy machinery. ? Drink alcohol. ? Take sleeping pills or  medicines that cause drowsiness. ? Make important decisions or sign legal documents. ? Take care of children on your own.  Rest. Eating and drinking  Follow the diet that is recommended by your health care provider.  If you vomit, drink water, juice, or soup when you can drink without vomiting.  Make sure you have little or no nausea before eating solid foods. General instructions  Have a responsible adult stay with you until you are awake and alert.  Take over-the-counter and prescription medicines only as told by your health care provider.  If you smoke, do not smoke without supervision.  Keep all follow-up visits as told by your health care provider. This is important. Contact a health care provider if:  You keep feeling nauseous or you keep vomiting.  You feel light-headed.  You develop a rash.  You have a fever. Get help right away if:  You have trouble breathing. This information is not intended to replace advice given to you by your health care provider. Make sure you discuss any questions you have with your health care provider. Document Released: 02/28/2016 Document Revised: 06/29/2016 Document Reviewed: 02/28/2016 Elsevier Interactive Patient Education  [employer].

## 2018-06-28 ENCOUNTER — Encounter (HOSPITAL_COMMUNITY)
Admission: RE | Admit: 2018-06-28 | Discharge: 2018-06-28 | Disposition: A | Discharge status: Home / Self Care | Payer: Commercial Managed Care - PPO | Source: Ambulatory Visit | Attending: Internal Medicine | Admitting: Internal Medicine

## 2018-06-28 ENCOUNTER — Encounter (HOSPITAL_COMMUNITY): Payer: Self-pay

## 2018-06-28 ENCOUNTER — Other Ambulatory Visit: Payer: Self-pay

## 2018-06-28 DIAGNOSIS — Z01810 Encounter for preprocedural cardiovascular examination: Secondary | ICD-10-CM | POA: Insufficient documentation

## 2018-06-28 DIAGNOSIS — Z01812 Encounter for preprocedural laboratory examination: Secondary | ICD-10-CM | POA: Insufficient documentation

## 2018-06-28 LAB — CBC
HCT: 42.5 % (ref 39.0–52.0)
Hemoglobin: 14.4 g/dL (ref 13.0–17.0)
MCH: 31.2 pg (ref 26.0–34.0)
MCHC: 33.9 g/dL (ref 30.0–36.0)
MCV: 92 fL (ref 78.0–100.0)
PLATELETS: 236 10*3/uL (ref 150–400)
RBC: 4.62 MIL/uL (ref 4.22–5.81)
RDW: 12.9 % (ref 11.5–15.5)
WBC: 4.2 10*3/uL (ref 4.0–10.5)

## 2018-06-28 LAB — BASIC METABOLIC PANEL
Anion gap: 6 (ref 5–15)
BUN: 13 mg/dL (ref 6–20)
CO2: 27 mmol/L (ref 22–32)
Calcium: 8.8 mg/dL — ABNORMAL LOW (ref 8.9–10.3)
Chloride: 104 mmol/L (ref 98–111)
Creatinine, Ser: 1.14 mg/dL (ref 0.61–1.24)
GFR calc Af Amer: >60 mL/min (ref >60)
Glucose, Bld: 103 mg/dL — ABNORMAL HIGH (ref 70–99)
Potassium: 3.4 mmol/L — ABNORMAL LOW (ref 3.5–5.1)
SODIUM: 137 mmol/L (ref 135–145)

## 2018-07-05 ENCOUNTER — Ambulatory Visit (HOSPITAL_COMMUNITY): Payer: Commercial Managed Care - PPO | Admitting: Anesthesiology

## 2018-07-05 ENCOUNTER — Encounter (HOSPITAL_COMMUNITY)
Admission: RE | Disposition: A | Discharge status: Home / Self Care | Payer: Self-pay | Source: Ambulatory Visit | Attending: Internal Medicine

## 2018-07-05 ENCOUNTER — Ambulatory Visit (HOSPITAL_COMMUNITY)
Admission: RE | Admit: 2018-07-05 | Discharge: 2018-07-05 | Disposition: A | Discharge status: Home / Self Care | Payer: Commercial Managed Care - PPO | Source: Ambulatory Visit | Attending: Internal Medicine | Admitting: Internal Medicine

## 2018-07-05 ENCOUNTER — Encounter (HOSPITAL_COMMUNITY): Payer: Self-pay

## 2018-07-05 DIAGNOSIS — J309 Allergic rhinitis, unspecified: Secondary | ICD-10-CM | POA: Insufficient documentation

## 2018-07-05 DIAGNOSIS — K219 Gastro-esophageal reflux disease without esophagitis: Secondary | ICD-10-CM | POA: Diagnosis not present

## 2018-07-05 DIAGNOSIS — I10 Essential (primary) hypertension: Secondary | ICD-10-CM | POA: Diagnosis not present

## 2018-07-05 DIAGNOSIS — Z803 Family history of malignant neoplasm of breast: Secondary | ICD-10-CM | POA: Diagnosis not present

## 2018-07-05 DIAGNOSIS — D125 Benign neoplasm of sigmoid colon: Secondary | ICD-10-CM

## 2018-07-05 DIAGNOSIS — Z823 Family history of stroke: Secondary | ICD-10-CM | POA: Insufficient documentation

## 2018-07-05 DIAGNOSIS — Z801 Family history of malignant neoplasm of trachea, bronchus and lung: Secondary | ICD-10-CM | POA: Insufficient documentation

## 2018-07-05 DIAGNOSIS — Z8249 Family history of ischemic heart disease and other diseases of the circulatory system: Secondary | ICD-10-CM | POA: Insufficient documentation

## 2018-07-05 DIAGNOSIS — E785 Hyperlipidemia, unspecified: Secondary | ICD-10-CM | POA: Insufficient documentation

## 2018-07-05 DIAGNOSIS — F419 Anxiety disorder, unspecified: Secondary | ICD-10-CM | POA: Insufficient documentation

## 2018-07-05 DIAGNOSIS — K921 Melena: Secondary | ICD-10-CM | POA: Diagnosis present

## 2018-07-05 DIAGNOSIS — K449 Diaphragmatic hernia without obstruction or gangrene: Secondary | ICD-10-CM | POA: Diagnosis not present

## 2018-07-05 DIAGNOSIS — Z79899 Other long term (current) drug therapy: Secondary | ICD-10-CM | POA: Insufficient documentation

## 2018-07-05 DIAGNOSIS — G43909 Migraine, unspecified, not intractable, without status migrainosus: Secondary | ICD-10-CM | POA: Insufficient documentation

## 2018-07-05 DIAGNOSIS — K5730 Diverticulosis of large intestine without perforation or abscess without bleeding: Secondary | ICD-10-CM | POA: Insufficient documentation

## 2018-07-05 DIAGNOSIS — Z8349 Family history of other endocrine, nutritional and metabolic diseases: Secondary | ICD-10-CM | POA: Diagnosis not present

## 2018-07-05 DIAGNOSIS — K625 Hemorrhage of anus and rectum: Secondary | ICD-10-CM

## 2018-07-05 DIAGNOSIS — Z8379 Family history of other diseases of the digestive system: Secondary | ICD-10-CM | POA: Insufficient documentation

## 2018-07-05 DIAGNOSIS — C20 Malignant neoplasm of rectum: Secondary | ICD-10-CM | POA: Diagnosis not present

## 2018-07-05 DIAGNOSIS — D123 Benign neoplasm of transverse colon: Secondary | ICD-10-CM | POA: Insufficient documentation

## 2018-07-05 DIAGNOSIS — F329 Major depressive disorder, single episode, unspecified: Secondary | ICD-10-CM | POA: Diagnosis not present

## 2018-07-05 DIAGNOSIS — R198 Other specified symptoms and signs involving the digestive system and abdomen: Secondary | ICD-10-CM

## 2018-07-05 HISTORY — PX: BIOPSY: SHX5522

## 2018-07-05 HISTORY — PX: COLONOSCOPY WITH PROPOFOL: SHX5780

## 2018-07-05 HISTORY — PX: POLYPECTOMY: SHX5525

## 2018-07-05 LAB — COMPREHENSIVE METABOLIC PANEL
ALBUMIN: 4.5 g/dL (ref 3.5–5.0)
ALK PHOS: 78 U/L (ref 38–126)
ALT: 27 U/L (ref 0–44)
AST: 23 U/L (ref 15–41)
Anion gap: 8 (ref 5–15)
BUN: 10 mg/dL (ref 6–20)
CHLORIDE: 101 mmol/L (ref 98–111)
CO2: 30 mmol/L (ref 22–32)
CREATININE: 0.95 mg/dL (ref 0.61–1.24)
Calcium: 9.1 mg/dL (ref 8.9–10.3)
GFR calc Af Amer: >60 mL/min (ref >60)
GFR calc non Af Amer: >60 mL/min (ref >60)
GLUCOSE: 105 mg/dL — AB (ref 70–99)
Potassium: 3.5 mmol/L (ref 3.5–5.1)
SODIUM: 139 mmol/L (ref 135–145)
Total Bilirubin: 1 mg/dL (ref 0.3–1.2)
Total Protein: 7.8 g/dL (ref 6.5–8.1)

## 2018-07-05 LAB — CBC
HCT: 44.8 % (ref 39.0–52.0)
Hemoglobin: 15.4 g/dL (ref 13.0–17.0)
MCH: 31.5 pg (ref 26.0–34.0)
MCHC: 34.4 g/dL (ref 30.0–36.0)
MCV: 91.6 fL (ref 78.0–100.0)
PLATELETS: 257 10*3/uL (ref 150–400)
RBC: 4.89 MIL/uL (ref 4.22–5.81)
RDW: 12.5 % (ref 11.5–15.5)
WBC: 4.7 10*3/uL (ref 4.0–10.5)

## 2018-07-05 SURGERY — COLONOSCOPY WITH PROPOFOL
Anesthesia: Monitor Anesthesia Care

## 2018-07-05 MED ORDER — PROPOFOL 500 MG/50ML IV EMUL
INTRAVENOUS | Status: DC | PRN
  Administered 2018-07-05: 150 ug/kg/min via INTRAVENOUS
  Administered 2018-07-05: 09:00:00 via INTRAVENOUS

## 2018-07-05 MED ORDER — CHLORHEXIDINE GLUCONATE CLOTH 2 % EX PADS: 6.0000 | MEDICATED_PAD | Freq: Once | CUTANEOUS | Status: DC

## 2018-07-05 MED ORDER — PROPOFOL 10 MG/ML IV BOLUS
INTRAVENOUS | Status: AC
  Filled 2018-07-05: qty 60

## 2018-07-05 MED ORDER — PROPOFOL 10 MG/ML IV BOLUS
INTRAVENOUS | Status: DC | PRN
  Administered 2018-07-05: 30 mg via INTRAVENOUS
  Administered 2018-07-05: 40 mg via INTRAVENOUS

## 2018-07-05 MED ORDER — LIDOCAINE HCL (PF) 1 % IJ SOLN
INTRAMUSCULAR | Status: AC
  Filled 2018-07-05: qty 5

## 2018-07-05 MED ORDER — GLYCOPYRROLATE 0.2 MG/ML IJ SOLN
INTRAMUSCULAR | Status: AC
  Filled 2018-07-05: qty 1

## 2018-07-05 MED ORDER — LACTATED RINGERS IV SOLN
INTRAVENOUS | Status: DC
  Administered 2018-07-05: 08:00:00 via INTRAVENOUS

## 2018-07-05 NOTE — Transfer of Care (Signed)
Immediate Anesthesia Transfer of Care Note  Patient: Jacob Hayes  Procedure(s) Performed: COLONOSCOPY WITH PROPOFOL (N/A ) POLYPECTOMY BIOPSY  Patient Location: PACU  Anesthesia Type:MAC  Level of Consciousness: awake, alert , oriented and patient cooperative  Airway & Oxygen Therapy: Patient Spontanous Breathing  Post-op Assessment: Report given to RN and Post -op Vital signs reviewed and stable  Post vital signs: Reviewed and stable  Last Vitals:  Vitals Value Taken Time  BP    Temp    Pulse 70 07/05/2018  9:02 AM  Resp    SpO2 98 % 07/05/2018  9:02 AM  Vitals shown include unvalidated device data.  Last Pain:  Vitals:   07/05/18 0723  TempSrc: Oral  PainSc: 0-No pain         Complications: No apparent anesthesia complications

## 2018-07-05 NOTE — Op Note (Addendum)
[address] [facility] Patient Name: Jacob Hayes Procedure Date: 07/05/2018 8:07 AM MRN: [id number] Date of Birth: 07/02/1981 Attending MD: Norvel Richards , MD CSN: [id number] Age: 37 Admit Type: Outpatient Procedure:                Colonoscopy Indications:              Hematochezia Providers:                Norvel Richards, MD, Lurline Del, RN, Randa Spike, Technician Referring MD:             Chesley Noon Medicines:                Propofol per Anesthesia Complications:            No immediate complications. Estimated Blood Loss:     Estimated blood loss was minimal. Procedure:                Pre-Anesthesia Assessment:                           - Prior to the procedure, a History and Physical                            was performed, and patient medications and                            allergies were reviewed. The patient's tolerance of                            previous anesthesia was also reviewed. The risks                            and benefits of the procedure and the sedation                            options and risks were discussed with the patient.                            All questions were answered, and informed consent                            was obtained. Prior Anticoagulants: The patient has                            taken no previous anticoagulant or antiplatelet                            agents. ASA Grade Assessment: II - A patient with                            mild systemic disease. After reviewing the risks  and benefits, the patient was deemed in                            satisfactory condition to undergo the procedure.                           After obtaining informed consent, the colonoscope                            was passed under direct vision. Throughout the                            procedure, the patient's blood pressure, pulse, and                            oxygen  saturations were monitored continuously. The                            CF-[id number] ([id number]) scope was introduced through                            the and advanced to the cecum. The colonoscopy was                            performed without difficulty. The patient tolerated                            the procedure well. The quality of the bowel                            preparation was adequate. The ileocecal valve,                            appendiceal orifice, and rectum were photographed. Scope In: 8:33:20 AM Scope Out: 8:54:51 AM Scope Withdrawal Time: 0 hours 18 minutes 57 seconds  Total Procedure Duration: 0 hours 21 minutes 31 seconds  Findings:      The perianal and digital rectal examinations were normal.      Sessile appearing mass with centrally depressed ulceration found,       located 15 cm from anal verge (not appreciable on DRE), Non-obstructing       - approximately 4 x 4 centimeters in dimension. This was biopsied with       cold jumbo forceps for histology. Estimated blood loss was minimal.      Scattered medium-mouthed diverticula were found in the entire colon.      Two semi-pedunculated polyps were found in the colon. The polyps were 5       to 6 mm in size. These polyps were removed with a cold snare. Resection       and retrieval were complete. Estimated blood loss was minimal.      The exam was otherwise without abnormality on direct and retroflexion       views. Impression:               - Tumor in the rectum. Biopsied.                           -  Diverticulosis in the entire examined colon.                           - Two 5 to 6 mm polyps, removed with a cold snare.                            Resected and retrieved.                           - The examination was otherwise normal on direct                            and retroflexion views. Moderate Sedation:      Moderate (conscious) sedation was personally administered by an       anesthesia  professional. The following parameters were monitored: oxygen       saturation, heart rate, blood pressure, respiratory rate, EKG, adequacy       of pulmonary ventilation, and response to care. Total physician       intraservice time was 24 minutes. Recommendation:           - Patient has a contact number available for                            emergencies. The signs and symptoms of potential                            delayed complications were discussed with the                            patient. Return to normal activities tomorrow.                            Written discharge instructions were provided to the                            patient. CBC, Chem-12 and CEA today. Anticipate                            surgical referral in the near future. Procedure Code(s):        --- Professional ---                           [phone], Colonoscopy, flexible; with removal of                            tumor(s), polyp(s), or other lesion(s) by snare                            technique                           45380, 59, Colonoscopy, flexible; with biopsy,                            single or multiple Diagnosis Code(s):        ---  Professional ---                           D49.0, Neoplasm of unspecified behavior of                            digestive system                           D12.6, Benign neoplasm of colon, unspecified                           K92.1, Melena (includes Hematochezia)                           K57.30, Diverticulosis of large intestine without                            perforation or abscess without bleeding CPT copyright 2017 American Medical Association. All rights reserved. The codes documented in this report are preliminary and upon coder review may  be revised to meet current compliance requirements. [name]. Jeydan Barner, MD Norvel Richards, MD 07/05/2018 9:46:42 AM This report has been signed electronically. Number of Addenda: 0

## 2018-07-05 NOTE — Interval H&P Note (Signed)
History and Physical Interval Note:  07/05/2018 8:25 AM  Jacob Hayes  has presented today for surgery, with the diagnosis of rectal bleeding, change in bowels  The various methods of treatment have been discussed with the patient and family. After consideration of risks, benefits and other options for treatment, the patient has consented to  Procedure(s) with comments: COLONOSCOPY WITH PROPOFOL (N/A) - 8:30am as a surgical intervention .  The patient's history has been reviewed, patient examined, no change in status, stable for surgery.  I have reviewed the patient's chart and labs.  Questions were answered to the patient's satisfaction.     Jacob Hayes  No change. Diagnostic colonoscopy per plan.  The risks, benefits, limitations, alternatives and imponderables have been reviewed with the patient. Questions have been answered. All parties are agreeable.

## 2018-07-05 NOTE — Discharge Instructions (Signed)
°  Colonoscopy Discharge Instructions  Read the instructions outlined below and refer to this sheet in the next few weeks. These discharge instructions provide you with general information on caring for yourself after you leave the hospital. Your doctor may also give you specific instructions. While your treatment has been planned according to the most current medical practices available, unavoidable complications occasionally occur. If you have any problems or questions after discharge, call [name] at [phone]. ACTIVITY  You may resume your regular activity, but move at a slower pace for the next 24 hours.   Take frequent rest periods for the next 24 hours.   Walking will help get rid of the air and reduce the bloated feeling in your belly (abdomen).   No driving for 24 hours (because of the medicine (anesthesia) used during the test).    Do not sign any important legal documents or operate any machinery for 24 hours (because of the anesthesia used during the test).  NUTRITION  Drink plenty of fluids.   You may resume your normal diet as instructed by your doctor.   Begin with a light meal and progress to your normal diet. Heavy or fried foods are harder to digest and may make you feel sick to your stomach (nauseated).   Avoid alcoholic beverages for 24 hours or as instructed.  MEDICATIONS  You may resume your normal medications unless your doctor tells you otherwise.  WHAT YOU CAN EXPECT TODAY  Some feelings of bloating in the abdomen.   Passage of more gas than usual.   Spotting of blood in your stool or on the toilet paper.  IF YOU HAD POLYPS REMOVED DURING THE COLONOSCOPY:  No aspirin products for 7 days or as instructed.   No alcohol for 7 days or as instructed.   Eat a soft diet for the next 24 hours.  FINDING OUT THE RESULTS OF YOUR TEST Not all test results are available during your visit. If your test results are not back during the visit, make an appointment  with your caregiver to find out the results. Do not assume everything is normal if you have not heard from your caregiver or the medical facility. It is important for you to follow up on all of your test results.  SEEK IMMEDIATE MEDICAL ATTENTION IF:  You have more than a spotting of blood in your stool.   Your belly is swollen (abdominal distention).   You are nauseated or vomiting.   You have a temperature over 101.   You have abdominal pain or discomfort that is severe or gets worse throughout the day.    Polyp and diverticulosis information provided  You have a mass in your rectum which will require an operation to remove.  Further recommendations to follow pending review of pathology report  Labs today:  CBC, Chem-12, CEA.

## 2018-07-05 NOTE — Anesthesia Procedure Notes (Signed)
Procedure Name: MAC Date/Time: 07/05/2018 8:35 AM Performed by: Andree Elk Amy A, CRNA Pre-anesthesia Checklist: Patient identified, Emergency Drugs available, Suction available, Patient being monitored and Timeout performed Oxygen Delivery Method: Simple face mask

## 2018-07-05 NOTE — Anesthesia Preprocedure Evaluation (Signed)
Anesthesia Evaluation  Patient identified by MRN, date of birth, ID band Patient awake    Reviewed: Allergy & Precautions, H&P , NPO status , Patient's Chart, lab work & pertinent test results  Airway Mallampati: II  TM Distance: >3 FB Neck ROM: full    Dental no notable dental hx.    Pulmonary neg pulmonary ROS,    Pulmonary exam normal breath sounds clear to auscultation       Cardiovascular Exercise Tolerance: Good hypertension, negative cardio ROS   Rhythm:regular Rate:Normal     Neuro/Psych  Headaches, Anxiety negative neurological ROS  negative psych ROS   GI/Hepatic negative GI ROS, Neg liver ROS, GERD  ,  Endo/Other  negative endocrine ROS  Renal/GU negative Renal ROS  negative genitourinary   Musculoskeletal   Abdominal   Peds  Hematology negative hematology ROS (+)   Anesthesia Other Findings Rectal bleeding  Reproductive/Obstetrics negative OB ROS                             Anesthesia Physical Anesthesia Plan  ASA: II  Anesthesia Plan: MAC   Post-op Pain Management:    Induction:   PONV Risk Score and Plan:   Airway Management Planned:   Additional Equipment:   Intra-op Plan:   Post-operative Plan:   Informed Consent: I have reviewed the patients History and Physical, chart, labs and discussed the procedure including the risks, benefits and alternatives for the proposed anesthesia with the patient or authorized representative who has indicated his/her understanding and acceptance.     Plan Discussed with: CRNA  Anesthesia Plan Comments:         Anesthesia Quick Evaluation

## 2018-07-05 NOTE — Anesthesia Postprocedure Evaluation (Signed)
Anesthesia Post Note  Patient: Jacob Hayes  Procedure(s) Performed: COLONOSCOPY WITH PROPOFOL (N/A ) POLYPECTOMY BIOPSY  Patient location during evaluation: PACU Anesthesia Type: MAC Level of consciousness: awake and alert and oriented Pain management: pain level controlled Vital Signs Assessment: post-procedure vital signs reviewed and stable Respiratory status: spontaneous breathing Cardiovascular status: stable Postop Assessment: no apparent nausea or vomiting Anesthetic complications: no     Last Vitals:  Vitals:   07/05/18 0723  BP: 128/83  Pulse: 66  Resp: 15  Temp: 36.7 C    Last Pain:  Vitals:   07/05/18 0723  TempSrc: Oral  PainSc: 0-No pain                 Kylii Ennis A

## 2018-07-06 ENCOUNTER — Telehealth: Payer: Self-pay | Admitting: Internal Medicine

## 2018-07-06 DIAGNOSIS — C19 Malignant neoplasm of rectosigmoid junction: Secondary | ICD-10-CM

## 2018-07-06 LAB — CEA: CEA: 1.7 ng/mL (ref 0.0–4.7)

## 2018-07-06 NOTE — Telephone Encounter (Signed)
Dr. Claudette Laws called me from the pathology department. Rectal biopsies confirm adenocarcinoma.  I called the patient and informed Jacob Hayes he has colorectal cancer.    He desires to see [facility] Marcello Moores or [place]).  Let's expedite the referral.  Also, I reviewed labs with Jacob Hayes. He is not anemic. His CEA is normal.  Let's go ahead and order a contrast  CT of the chest, abdomen and pelviis ASAP-next week.  Shoot for early a.m. Or Late p.m. appointmen5t

## 2018-07-09 ENCOUNTER — Other Ambulatory Visit: Payer: Self-pay

## 2018-07-09 DIAGNOSIS — C19 Malignant neoplasm of rectosigmoid junction: Secondary | ICD-10-CM

## 2018-07-09 NOTE — Telephone Encounter (Signed)
CT chest and CT abd/pelvis w/contrast scheduled for 07/10/18 at 10:30am, arrive at 10:15am. NPO after midnight. Pickup contrast at [facility] radiology. Called and informed pt. ASAP referral faxed to [facility].

## 2018-07-09 NOTE — Telephone Encounter (Signed)
Referral sent to [facility] via [employer]. 

## 2018-07-09 NOTE — Telephone Encounter (Signed)
Called [facility], no PA needed for CT abd/pelvis and CT chest w/contrast. Ref# [id number].

## 2018-07-09 NOTE — Telephone Encounter (Signed)
Pt called office, he has appt 07/10/18 at 9:10 with Dr. Marcello Moores at [facility]. He asked to reschedule CT's. [employer], spoke to [place]. She advised she would leave CT's as scheduled for tomorrow and he could go to [facility] after he leaves the surgeons office. She would make note.  Called pt back and informed him.  Routing to [facility] as FYI.

## 2018-07-09 NOTE — Addendum Note (Signed)
Addended by: Hassan Rowan on: 07/09/2018 09:17 AM   Modules accepted: Orders

## 2018-07-10 ENCOUNTER — Ambulatory Visit (HOSPITAL_COMMUNITY)
Admission: RE | Admit: 2018-07-10 | Discharge: 2018-07-10 | Disposition: A | Discharge status: Home / Self Care | Payer: Commercial Managed Care - PPO | Source: Ambulatory Visit | Attending: Internal Medicine | Admitting: Internal Medicine

## 2018-07-10 ENCOUNTER — Ambulatory Visit (HOSPITAL_COMMUNITY): Payer: Commercial Managed Care - PPO

## 2018-07-10 ENCOUNTER — Telehealth: Payer: Self-pay

## 2018-07-10 ENCOUNTER — Other Ambulatory Visit (HOSPITAL_COMMUNITY): Payer: Self-pay | Admitting: *Deleted

## 2018-07-10 ENCOUNTER — Encounter (HOSPITAL_COMMUNITY): Payer: Self-pay | Admitting: Internal Medicine

## 2018-07-10 DIAGNOSIS — C19 Malignant neoplasm of rectosigmoid junction: Secondary | ICD-10-CM

## 2018-07-10 DIAGNOSIS — C20 Malignant neoplasm of rectum: Secondary | ICD-10-CM

## 2018-07-10 DIAGNOSIS — N281 Cyst of kidney, acquired: Secondary | ICD-10-CM | POA: Insufficient documentation

## 2018-07-10 MED ORDER — IOPAMIDOL (ISOVUE-300) INJECTION 61%
100.0000 mL | Freq: Once | INTRAVENOUS | Status: AC | PRN
  Administered 2018-07-10: 100 mL via INTRAVENOUS

## 2018-07-10 NOTE — Telephone Encounter (Signed)
Per [facility], referral sent to [place] (colorectal cancer).

## 2018-07-11 ENCOUNTER — Encounter: Payer: Self-pay | Admitting: Radiation Oncology

## 2018-07-11 ENCOUNTER — Other Ambulatory Visit (HOSPITAL_COMMUNITY): Payer: Self-pay | Admitting: *Deleted

## 2018-07-11 DIAGNOSIS — C20 Malignant neoplasm of rectum: Secondary | ICD-10-CM

## 2018-07-13 ENCOUNTER — Ambulatory Visit (HOSPITAL_COMMUNITY): Payer: Self-pay | Admitting: Hematology

## 2018-07-13 NOTE — Progress Notes (Signed)
GI Location of Tumor / Histology: Adenocarcinoma of rectum  Jacob Hayes presented with rectal bleeding and change in bowel habits.  He has noticed for the past several months he has noted increased stool frequency and looser than usual stools.  He has noted some bright red blood when he has bowel movements. He passes bright red blood per rectum small to moderate in volume, at least 5 days a week.  MRI Pelvis 07/16/2018: 4.2 cm polypoid lesion along the right lateral aspect of the upper rectum, corresponding to the patient's biopsy-proven rectal adenocarcinoma, as above.  CT Chest 07/10/2018: No suspicious pulmonary nodules or masses.  No aggressive lytic or sclerotic bone lesions identified.  CT Abdomen/ Pelvis 07/10/2018: Mass involving the rectosigmoid colon is identified measuring 3.7 cm in length with a diameter of approximately 2.3 cm.  Biopsies of Rectum 07/05/2018   Past/Anticipated interventions by GI, if any: PA Lewis 06/14/2018 -Would offer a colonoscopy with deep sedation for further evaluation.   Past/Anticipated interventions by surgeon, if any:  Dr. Marcello Moores  Past/Anticipated interventions by medical oncology, if any:  Dr. Delton Coombes 07/17/2018 11:10 am - looks controlled - postpone chemo until after surgery 1.  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 - Colonoscopy on 07/05/2018 shows a rectal mass, biopsy consistent with invasive moderately differentiated adenocarcinoma. - CT CAP on 07/10/2018 showing mass within the rectosigmoid colon, no evidence of nodal or distant metastatic disease. - MRI of the pelvis rectal protocol showing 4.2 cm polypoid lesion along the right lateral aspect of the upper rectum, 14 cm from anal sphincter, T stage: T1 or T2, N0. - I have discussed his diagnosis and treatment plan which includes transabdominal resection.  Since it is T1/2 N0, I did not recommend preoperative  chemoradiation. -I will see him back 4 weeks after his surgery to discuss pathology and further plan of action.  2.  Family history: - Mother had breast cancer.  She later developed lung cancer and was a smoker.  Maternal aunt also had lung cancer. -Based on his young age her diagnosis, I would recommend genetic testing.  We will also follow-up on the MSI testing on the pathology.   Weight changes, if any: No  Bowel/Bladder complaints, if any: remains to have bloody stools, bright red.  Urgency, frequency mainly at night.  Nocturia x 0-1  Nausea / Vomiting, if any: No  Pain issues, if any:  No  Any blood per rectum: remains to have bright blood in his stools.    BP 131/86 (Patient Position: Sitting)   Pulse 71   Temp 98.2 F (36.8 C) (Oral)   Resp 18   Ht _0  (1.905 m)   Wt 222 lb (100.7 kg)   SpO2 100%   BMI 27.75 kg/m    Wt Readings from Last 3 Encounters:  07/17/18 222 lb (100.7 kg)  07/17/18 223 lb 3.2 oz (101.2 kg)  07/05/18 225 lb (102.1 kg)   SAFETY ISSUES:  Prior radiation? No  Pacemaker/ICD? No  Possible current pregnancy? No  Is the patient on methotrexate? No  Current Complaints/Details:

## 2018-07-16 ENCOUNTER — Ambulatory Visit (HOSPITAL_COMMUNITY)
Admission: RE | Admit: 2018-07-16 | Discharge: 2018-07-16 | Disposition: A | Discharge status: Home / Self Care | Payer: Commercial Managed Care - PPO | Source: Ambulatory Visit | Attending: Hematology | Admitting: Hematology

## 2018-07-16 DIAGNOSIS — C20 Malignant neoplasm of rectum: Secondary | ICD-10-CM | POA: Insufficient documentation

## 2018-07-16 MED ORDER — GADOBENATE DIMEGLUMINE 529 MG/ML IV SOLN
20.0000 mL | Freq: Once | INTRAVENOUS | Status: AC | PRN
  Administered 2018-07-16: 20 mL via INTRAVENOUS

## 2018-07-17 ENCOUNTER — Inpatient Hospital Stay (HOSPITAL_COMMUNITY): Payer: Commercial Managed Care - PPO | Attending: Hematology | Admitting: Hematology

## 2018-07-17 ENCOUNTER — Other Ambulatory Visit: Payer: Self-pay

## 2018-07-17 ENCOUNTER — Encounter (HOSPITAL_COMMUNITY): Payer: Self-pay | Admitting: Hematology

## 2018-07-17 ENCOUNTER — Ambulatory Visit (HOSPITAL_COMMUNITY): Payer: Self-pay | Admitting: Hematology

## 2018-07-17 ENCOUNTER — Encounter: Payer: Self-pay | Admitting: Radiation Oncology

## 2018-07-17 ENCOUNTER — Ambulatory Visit
Admission: RE | Admit: 2018-07-17 | Discharge: 2018-07-17 | Disposition: A | Discharge status: Home / Self Care | Payer: Commercial Managed Care - PPO | Source: Ambulatory Visit | Attending: Radiation Oncology | Admitting: Radiation Oncology

## 2018-07-17 VITALS — BP 131/86 | HR 71 | Temp 98.2°F | Resp 18 | Ht 75.0 in | Wt 222.0 lb

## 2018-07-17 DIAGNOSIS — K219 Gastro-esophageal reflux disease without esophagitis: Secondary | ICD-10-CM | POA: Diagnosis not present

## 2018-07-17 DIAGNOSIS — C19 Malignant neoplasm of rectosigmoid junction: Secondary | ICD-10-CM | POA: Insufficient documentation

## 2018-07-17 DIAGNOSIS — Z803 Family history of malignant neoplasm of breast: Secondary | ICD-10-CM | POA: Diagnosis not present

## 2018-07-17 DIAGNOSIS — I10 Essential (primary) hypertension: Secondary | ICD-10-CM | POA: Diagnosis not present

## 2018-07-17 DIAGNOSIS — F418 Other specified anxiety disorders: Secondary | ICD-10-CM | POA: Insufficient documentation

## 2018-07-17 DIAGNOSIS — C20 Malignant neoplasm of rectum: Secondary | ICD-10-CM | POA: Insufficient documentation

## 2018-07-17 DIAGNOSIS — E785 Hyperlipidemia, unspecified: Secondary | ICD-10-CM | POA: Diagnosis not present

## 2018-07-17 DIAGNOSIS — Z801 Family history of malignant neoplasm of trachea, bronchus and lung: Secondary | ICD-10-CM | POA: Insufficient documentation

## 2018-07-17 DIAGNOSIS — Z79899 Other long term (current) drug therapy: Secondary | ICD-10-CM | POA: Diagnosis not present

## 2018-07-17 DIAGNOSIS — N281 Cyst of kidney, acquired: Secondary | ICD-10-CM | POA: Insufficient documentation

## 2018-07-17 NOTE — Assessment & Plan Note (Addendum)
1.  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 - Colonoscopy on 07/05/2018 shows a rectal mass, biopsy consistent with invasive moderately differentiated adenocarcinoma. - CT CAP on 07/10/2018 showing mass within the rectosigmoid colon, no evidence of nodal or distant metastatic disease. - MRI of the pelvis rectal protocol on 07/16/2018 shows a 4.2 cm polypoid lesion along the right lateral aspect of the upper rectum, 14 cm from anal sphincter, T stage: T1 or T2, N0. - I have discussed his diagnosis and treatment plan which includes transabdominal resection.  Since it is T1/2 N0, I did not recommend preoperative chemoradiation. -I will see him back 4 weeks after his surgery to discuss pathology and further plan of action.  2.  Family history: - Mother had breast cancer.  She later developed lung cancer and was a smoker.  Maternal aunt also had lung cancer. -Based on his young age her diagnosis, I would recommend genetic testing.  We will also follow-up on the MSI testing on the pathology.

## 2018-07-17 NOTE — Progress Notes (Addendum)
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 A diagnosis of Primary adenocarcinoma of rectosigmoid junction ([place]) was also pertinent to this visit.   HISTORY OF PRESENT ILLNESS: Jacob Hayes is a 37 y.o. male seen at the request of Dr. Delton Coombes for a new diagnosis of high rectal/low colon cancer. The patient noted rectal bleeding since February 2019 and had a colonoscopy on 07/05/18 that revealed a ulcerated lesion 15 cm from the anal verge and this was 4 x 4 cm in dimension. This was biopsied and consistent with adenocarcinoma. A CT C/A/P on 07/10/18 revealed thickening of the right aspect of the proximal rectum, and no evidence of adenopathy, or distance metastatic disease. He underwent an MRI on 07/16/18 and this revealed a T1-2, N0 lesion 14 cm from the anal sphincter, and the tumor was about 4.2 cm. He is anticipating surgical resection, but comes today to discuss options if radiotherapy was needed.    PREVIOUS RADIATION THERAPY: No   PAST MEDICAL HISTORY:  Past Medical History:  Diagnosis Date  . Allergic rhinitis   . Anxiety   . Anxiety and depression   . Bruxism   . Colon cancer ([place])   . Essential hypertension, benign   . GERD   . Hyperlipidemia    Since age 75  . Migraines        PAST SURGICAL HISTORY: Past Surgical History:  Procedure Laterality Date  . BIOPSY N/A 03/20/2013   Procedure: BIOPSY;  Surgeon: [name], MD;  [facility];  Service: Endoscopy;  Laterality: N/A;  Possible small bowel biopsy  . BIOPSY  07/05/2018   Procedure: BIOPSY;  Surgeon: [name], MD;  [facility];  Service: Endoscopy;;  rectal mass  . COLONOSCOPY WITH  PROPOFOL N/A 07/05/2018   Procedure: COLONOSCOPY WITH PROPOFOL;  Surgeon: [name], MD;  [facility];  Service: Endoscopy;  Laterality: N/A;  8:30am  . ESOPHAGOGASTRODUODENOSCOPY N/A 03/20/2013   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  One slightly nodular.  Small hiatal hernia.  Multiple fundic gland appearing polyps. no evidence of celiac disease, malignancy, h.pylori, barrett's.   . None    . POLYPECTOMY  07/05/2018   Procedure: POLYPECTOMY;  Surgeon: [name], MD;  [facility];  Service: Endoscopy;;  hepatic flexurex2;     FAMILY HISTORY:  Family History  Problem Relation Age of Onset  . Breast cancer Mother   . Thyroid disease Mother        Thyroid removed  . Lung cancer Mother   . Crohn's disease Mother        ???  . Heart disease Father   . Stroke Father   . Hyperlipidemia Father   . AAA (abdominal aortic aneurysm) Father   . Valvular heart disease Brother        Bicuspid aortic valve  . Lung cancer Maternal Aunt   . Colon cancer Neg Hx      SOCIAL HISTORY:  reports that he has never smoked. He has never used smokeless tobacco. He reports that he  does not drink alcohol or use drugs.   ALLERGIES: Patient has no known allergies.   MEDICATIONS:  Current Outpatient Medications  Medication Sig Dispense Refill  . acetaminophen (TYLENOL) 500 MG tablet Take 500 mg by mouth every 6 (six) hours as needed (for pain.).    Marland Kitchen Ascorbic Acid (VITAMIN C) 1000 MG tablet Take 1,000 mg by mouth daily.    Marland Kitchen b complex vitamins capsule Take 1 capsule by mouth daily.    . cetirizine (ZYRTEC) 10 MG tablet Take 10 mg by mouth daily as needed (for seasonal allergies.).    Marland Kitchen diazepam (VALIUM) 5 MG tablet Take 2.5 mg by mouth at bedtime. for anxiety  1  . diphenhydrAMINE (BENADRYL) 25 mg capsule Take 25 mg by mouth daily as needed (allergies.).    Marland Kitchen  fluticasone (FLONASE) 50 MCG/ACT nasal spray Place 2 sprays into the nose daily. (Patient taking differently: Place 2 sprays into the nose daily as needed (for seasonal allergies.). ) 16 g 11  . hydrochlorothiazide (MICROZIDE) 12.5 MG capsule Take 12.5 mg by mouth daily.  5  . loratadine (CLARITIN) 10 MG tablet Take 10 mg by mouth daily as needed (for seasonal allergies).     . losartan (COZAAR) 100 MG tablet Take 50 mg by mouth 2 (two) times daily.  5  . Magnesium 250 MG TABS Take 250 mg by mouth 2 (two) times daily.     . methimazole (TAPAZOLE) 5 MG tablet Take 5 mg by mouth daily.     . metroNIDAZOLE (FLAGYL) 500 MG tablet     . Multiple Vitamin (MULTIVITAMIN WITH MINERALS) TABS tablet Take 1 tablet by mouth daily. Men's One-A-Day    . neomycin (MYCIFRADIN) 500 MG tablet     . pantoprazole (PROTONIX) 40 MG tablet TAKE ONE TABLET BY MOUTH TWICE DAILY BEFORE A MEAL (Patient taking differently: TAKE ONE TABLET BY MOUTH DAILY BEFORE A MEAL IN THE MORNING.) 60 tablet 0  . simvastatin (ZOCOR) 40 MG tablet Take 40 mg by mouth every evening.     Marland Kitchen ibuprofen (ADVIL,MOTRIN) 200 MG tablet Take 200 mg by mouth daily as needed (for pain.).     No current facility-administered medications for this encounter.      REVIEW OF SYSTEMS: On review of systems, the patient reports that he is doing well overall. He denies any chest pain, shortness of breath, cough, fevers, chills, night sweats, unintended weight changes. He has had intermittent rectal bleeding, but today denies any bowel or bladder disturbances, and denies abdominal pain, nausea or vomiting. He denies any new musculoskeletal or joint aches or pains. A complete review of systems is obtained and is otherwise negative.     PHYSICAL EXAM:  Wt Readings from Last 3 Encounters:  07/17/18 222 lb (100.7 kg)  07/17/18 223 lb 3.2 oz (101.2 kg)  07/05/18 225 lb (102.1 kg)   Temp Readings from Last 3 Encounters:  07/17/18 98.2 F (36.8 C) (Oral)    07/17/18 98 F (36.7 C) (Oral)  07/05/18 98.1 F (36.7 C) (Oral)   BP Readings from Last 3 Encounters:  07/17/18 131/86  07/17/18 138/85  07/05/18 127/84   Pulse Readings from Last 3 Encounters:  07/17/18 71  07/17/18 62  07/05/18 67   Pain Assessment Pain Score: 0-No pain/10  In general this is a well appearing caucasian male in no acute distress. He is alert and oriented x4 and appropriate throughout the examination. HEENT reveals that the patient is normocephalic, atraumatic. EOMs are intact.. Cardiopulmonary  assessment is negative for acute distress and he exhibits normal effort.    ECOG = 1  0 - Asymptomatic (Fully active, able to carry on all predisease activities without restriction)  1 - Symptomatic but completely ambulatory (Restricted in physically strenuous activity but ambulatory and able to carry out work of a light or sedentary nature. For example, light housework, office work)  2 - Symptomatic, <50% in bed during the day (Ambulatory and capable of all self care but unable to carry out any work activities. Up and about more than 50% of waking hours)  3 - Symptomatic, >50% in bed, but not bedbound (Capable of only limited self-care, confined to bed or chair 50% or more of waking hours)  4 - Bedbound (Completely disabled. Cannot carry on any self-care. Totally confined to bed or chair)  5 - Death   Eustace Pen MM, Creech RH, Tormey DC, et al. [phone]). "Toxicity and response criteria of the [facility]". [place] Oncol. 5 (6): 649-55    LABORATORY DATA:  Lab Results  Component Value Date   WBC 4.7 07/05/2018   HGB 15.4 07/05/2018   HCT 44.8 07/05/2018   MCV 91.6 07/05/2018   PLT 257 07/05/2018   Lab Results  Component Value Date   NA 139 07/05/2018   K 3.5 07/05/2018   CL 101 07/05/2018   CO2 30 07/05/2018   Lab Results  Component Value Date   ALT 27 07/05/2018   AST 23 07/05/2018   ALKPHOS 78 07/05/2018   BILITOT 1.0  07/05/2018      RADIOGRAPHY: Ct Chest W Contrast  Result Date: 07/10/2018 CLINICAL DATA:  Staging colorectal carcinoma EXAM: CT CHEST, ABDOMEN, AND PELVIS WITH CONTRAST TECHNIQUE: Multidetector CT imaging of the chest, abdomen and pelvis was performed following the standard protocol during bolus administration of intravenous contrast. CONTRAST:  136mL ISOVUE-300 IOPAMIDOL (ISOVUE-300) INJECTION 61% COMPARISON:  None FINDINGS: CT CHEST FINDINGS Cardiovascular: No significant vascular findings. Normal heart size. No pericardial effusion. Mediastinum/Nodes: No enlarged mediastinal, hilar, or axillary lymph nodes. Thyroid gland, trachea, and esophagus demonstrate no significant findings. Lungs/Pleura: No pleural effusion. No airspace consolidation, atelectasis or pneumothorax. No suspicious pulmonary nodules or masses. Musculoskeletal: No aggressive lytic or sclerotic bone lesions identified. CT ABDOMEN PELVIS FINDINGS Hepatobiliary: No focal liver abnormality is seen. No gallstones, gallbladder wall thickening, or biliary dilatation. Pancreas: Unremarkable. No pancreatic ductal dilatation or surrounding inflammatory changes. Spleen: Normal in size without focal abnormality. Adrenals/Urinary Tract: The adrenal glands appear within normal limits. Unremarkable appearance of the right kidney. Left kidney cyst is noted measuring 1.2 cm. No mass or hydronephrosis identified. Urinary bladder appears normal. Stomach/Bowel: Normal appearance of the stomach. The small bowel loops have a normal course and caliber. The appendix is visualized and appears normal. No pathologic dilatation of the colon. The sigmoid colon and rectum are decompressed. Mass involving the rectosigmoid colon is identified measuring 3.7 cm in length with a diameter of approximately 2.3 cm. Nonobstructing, image, image 121/2 and image 98/4. Vascular/Lymphatic: Normal appearance of the abdominal aorta. No enlarged lymph nodes within the upper abdomen.  No pelvic or inguinal adenopathy. No significant perirectal adenopathy. Reproductive: Prostate is unremarkable. Other: No ascites.  No peritoneal nodularity or mass. Musculoskeletal: No aggressive appearing bone lesions. IMPRESSION: 1. Mass within the rectosigmoid colon is identified compatible with findings from recent colonoscopy. 2. No evidence for nodal metastasis or distant metastatic disease. 3. Left kidney cyst. Electronically Signed   By: Queen Slough.D.  On: 07/10/2018 14:47   Mr Pelvis W Wo Contrast  Result Date: 07/17/2018 CLINICAL DATA:  Newly diagnosed rectal cancer, for staging EXAM: MRI PELVIS WITHOUT AND WITH CONTRAST TECHNIQUE: Multiplanar multisequence MR imaging of the pelvis was performed both before and after administration of intravenous contrast. Small amount of [place] gel was administered per rectum to optimize tumor evaluation. CONTRAST:  54mL MULTIHANCE GADOBENATE DIMEGLUMINE 529 MG/ML IV SOLN COMPARISON:  None. FINDINGS: TUMOR LOCATION Location from Anal Verge:  High 10-15cm Shortest Distance from Tumor to Anal Sphincter:  14 cm TUMOR DESCRIPTION Circumferential Extent: Along the right lateral aspect of the upper rectum, extending from 11:00 to 7:00 (series 9/image 13). Craniocaudal Extent:  4.2 cm T - CATEGORY Extension through Muscularis Propria:  No = T1 or Maximum extension beyond Muscularis Propria:  0 mm (early T3 = <38mm; advanced T3 = >28mm) Extramural vascular invasion/tumor thrombus:  No Shortest distance of any tumor/node from Mesorectal Fascia:  14 mm Invasion of Anterior Peritoneal Reflection:  No Involvement of Adjacent Organs or Pelvic Sidewall Structures:  No N - CATEGORY Mesorectal Lymph Nodes >=88mm:  No = N0 Extra-mesorectal Lymphadenopathy:  No Other: None. IMPRESSION: 4.2 cm polypoid lesion along the right lateral aspect of the upper rectum, corresponding to the patient's biopsy-proven rectal adenocarcinoma, as above. Rectal adenocarcinoma T stage:  T1 or T2 Rectal  adenocarcinoma N stage:  N0 Distance from tumor to the anal sphincter is 14 cm. Electronically Signed   By: Julian Hy M.D.   On: 07/17/2018 09:55   Ct Abdomen Pelvis W Contrast  Result Date: 07/10/2018 CLINICAL DATA:  Staging colorectal carcinoma EXAM: CT CHEST, ABDOMEN, AND PELVIS WITH CONTRAST TECHNIQUE: Multidetector CT imaging of the chest, abdomen and pelvis was performed following the standard protocol during bolus administration of intravenous contrast. CONTRAST:  144mL ISOVUE-300 IOPAMIDOL (ISOVUE-300) INJECTION 61% COMPARISON:  None FINDINGS: CT CHEST FINDINGS Cardiovascular: No significant vascular findings. Normal heart size. No pericardial effusion. Mediastinum/Nodes: No enlarged mediastinal, hilar, or axillary lymph nodes. Thyroid gland, trachea, and esophagus demonstrate no significant findings. Lungs/Pleura: No pleural effusion. No airspace consolidation, atelectasis or pneumothorax. No suspicious pulmonary nodules or masses. Musculoskeletal: No aggressive lytic or sclerotic bone lesions identified. CT ABDOMEN PELVIS FINDINGS Hepatobiliary: No focal liver abnormality is seen. No gallstones, gallbladder wall thickening, or biliary dilatation. Pancreas: Unremarkable. No pancreatic ductal dilatation or surrounding inflammatory changes. Spleen: Normal in size without focal abnormality. Adrenals/Urinary Tract: The adrenal glands appear within normal limits. Unremarkable appearance of the right kidney. Left kidney cyst is noted measuring 1.2 cm. No mass or hydronephrosis identified. Urinary bladder appears normal. Stomach/Bowel: Normal appearance of the stomach. The small bowel loops have a normal course and caliber. The appendix is visualized and appears normal. No pathologic dilatation of the colon. The sigmoid colon and rectum are decompressed. Mass involving the rectosigmoid colon is identified measuring 3.7 cm in length with a diameter of approximately 2.3 cm. Nonobstructing, image, image  121/2 and image 98/4. Vascular/Lymphatic: Normal appearance of the abdominal aorta. No enlarged lymph nodes within the upper abdomen. No pelvic or inguinal adenopathy. No significant perirectal adenopathy. Reproductive: Prostate is unremarkable. Other: No ascites.  No peritoneal nodularity or mass. Musculoskeletal: No aggressive appearing bone lesions. IMPRESSION: 1. Mass within the rectosigmoid colon is identified compatible with findings from recent colonoscopy. 2. No evidence for nodal metastasis or distant metastatic disease. 3. Left kidney cyst. Electronically Signed   By: Kerby Moors M.D.   On: 07/10/2018 14:47  IMPRESSION/PLAN: 1. Stage I, cT1-2N0M0 adenocarcinoma of the rectosigmoid colon. Dr. Lisbeth Renshaw discusses the pathology findings and reviews the nature of proximal rectal/low colon cancer. Dr. Lisbeth Renshaw reviews that based on the findings to date, he would be a good surgical candidate and we do not anticipate a role for radiotherapy. That being said, we will await his final pathology and discuss in GI conference. We discussed the risks, benefits, short, and long term effects of radiotherapy, and discussed when we would consider adjuvant radiotherapy. If radiotherapy were needed, he would anticipate 5 1/2 weeks of radiotherapy. We will contact him tomorrow after conference to make sure he knows the discussion. 2. Possible genetic predisposition to malignancy. The patient is a candidate for genetic testing given his personal and family history. He was offered referral by Dr. Delton Coombes today and will meet with genetics in the near future.  In a visit lasting 30 minutes, greater than 50% of the time was spent face to face discussing his case, and coordinating the patient's care.   The above documentation reflects my direct findings during this shared patient visit. Please see the separate note by Dr. Lisbeth Renshaw on this date for the remainder of the patient's plan of care.    Carola Rhine,  PAC

## 2018-07-17 NOTE — Addendum Note (Signed)
Encounter addended by: Hayden Pedro, PA-C on: 07/17/2018 4:02 PM  Actions taken: Problem List modified, Visit diagnoses modified, Sign clinical note

## 2018-07-17 NOTE — Patient Instructions (Signed)
[place] at [facility] Discharge Instructions  Follow up 4 weeks after surgery with labs.    Thank you for choosing [place] at [facility] to provide your oncology and hematology care.  To afford each patient quality time with our provider, please arrive at least 15 minutes before your scheduled appointment time.   If you have a lab appointment with the [facility] please come in thru the  Main Entrance and check in at the main information desk  You need to re-schedule your appointment should you arrive 10 or more minutes late.  We strive to give you quality time with our providers, and arriving late affects you and other patients whose appointments are after yours.  Also, if you no show three or more times for appointments you may be dismissed from the clinic at the providers discretion.     Again, thank you for choosing [facility].  Our hope is that these requests will decrease the amount of time that you wait before being seen by our physicians.       _____________________________________________________________  Should you have questions after your visit to [facility], please contact our office at [phone] between the hours of 8:00 a.m. and 4:30 p.m.  Voicemails left after 4:00 p.m. will not be returned until the following business day.  For prescription refill requests, have your pharmacy contact our office and allow 72 hours.    Cancer Center Support Programs:   > Cancer Support Group  2nd Tuesday of the month 1pm-2pm, Journey Room

## 2018-07-17 NOTE — Progress Notes (Signed)
[facility] NOTE  Patient Care Team: Chesley Noon, MD as PCP - General (Family Medicine) [name], MD (Cardiology) [name], MD as Attending Physician (Gastroenterology)  CHIEF COMPLAINTS/PURPOSE OF CONSULTATION:  Rectal cancer.  HISTORY OF PRESENTING ILLNESS:  Jacob Hayes 37 y.o. male is seen in consultation today for further work-up and management of rectal cancer.  He reports having on and off bleeding per rectum started in February of this year.  He had a colonoscopy done on 07/05/2018 showing a sessile appearing mass with centrally depressed ulceration, located 15 cm from anal verge, not appreciable on DRE, nonobstructing, approximately 4 x 4 cm in dimension.  This was biopsied and consistent with adenocarcinoma.  He underwent a CT scan of the chest, abdomen and pelvis on 07/10/2018 which confirmed the mass without any nodal or distant metastatic disease.  An MRI of the pelvis rectal protocol was done on 07/16/2018.  Denies any personal history of malignancies.  No family history of colon cancers.  His mother had breast cancer followed by lung cancer.  Another maternal aunt had lung cancer.  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  Denies any recent weight loss.  He was reportedly diagnosed with hypertension at age 89 and has been on medication for it.  Denies any other comorbidities.  MEDICAL HISTORY:  Past Medical History:  Diagnosis Date  . Allergic rhinitis   . Anxiety   . Anxiety and depression   . Bruxism   . Colon cancer ([place])   . Essential hypertension, benign   . GERD   . Hyperlipidemia    Since age 37  . Migraines     SURGICAL HISTORY: Past Surgical History:  Procedure Laterality Date  . BIOPSY N/A 03/20/2013   Procedure: BIOPSY;  Surgeon: [name], MD;  [facility];  Service: Endoscopy;  Laterality: N/A;  Possible small bowel biopsy  . BIOPSY  07/05/2018   Procedure: BIOPSY;  Surgeon:  [name], MD;  [facility];  Service: Endoscopy;;  rectal mass  . COLONOSCOPY WITH PROPOFOL N/A 07/05/2018   Procedure: COLONOSCOPY WITH PROPOFOL;  Surgeon: [name], MD;  [facility];  Service: Endoscopy;  Laterality: N/A;  8:30am  . ESOPHAGOGASTRODUODENOSCOPY N/A 03/20/2013   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  One slightly nodular.  Small hiatal hernia.  Multiple fundic gland appearing polyps. no evidence of celiac disease, malignancy, h.pylori, barrett's.   . None    . POLYPECTOMY  07/05/2018   Procedure: POLYPECTOMY;  Surgeon: [name], MD;  [facility];  Service: Endoscopy;;  hepatic flexurex2;    SOCIAL HISTORY: Social History   Socioeconomic History  . Marital status: Divorced    Spouse name: Not on file  . Number of children: 2  . Years of education: Not on file  . Highest education level: Not on file  Occupational History  . Occupation: works with Sales executive at [employer]  . Financial resource strain: Not on file  . Food insecurity:    Worry: Not on file    Inability: Not on file  . Transportation needs:    Medical: Not on file    Non-medical: Not on file  Tobacco Use  . Smoking status: Never Smoker  . Smokeless tobacco: Never Used  Substance and Sexual Activity  .  Alcohol use: No  . Drug use: No  . Sexual activity: Yes  Lifestyle  . Physical activity:    Days per week: Not on file    Minutes per session: Not on file  . Stress: Not on file  Relationships  . Social connections:    Talks on phone: Not on file    Gets together: Not on file    Attends religious service: Not on file    Active member of club or organization: Not on file    Attends meetings of clubs or organizations: Not on file    Relationship status: Not on file  . Intimate partner violence:    Fear of current or ex  partner: Not on file    Emotionally abused: Not on file    Physically abused: Not on file    Forced sexual activity: Not on file  Other Topics Concern  . Not on file  Social History Narrative  . Not on file    FAMILY HISTORY: Family History  Problem Relation Age of Onset  . Breast cancer Mother   . Thyroid disease Mother        Thyroid removed  . Lung cancer Mother   . Crohn's disease Mother        ???  . Heart disease Father   . Stroke Father   . Hyperlipidemia Father   . AAA (abdominal aortic aneurysm) Father   . Valvular heart disease Brother        Bicuspid aortic valve  . Lung cancer Maternal Aunt   . Colon cancer Neg Hx     ALLERGIES:  has No Known Allergies.  MEDICATIONS:  Current Outpatient Medications  Medication Sig Dispense Refill  . acetaminophen (TYLENOL) 500 MG tablet Take 500 mg by mouth every 6 (six) hours as needed (for pain.).    [place] Kitchen Ascorbic Acid (VITAMIN C) 1000 MG tablet Take 1,000 mg by mouth daily.    [place] Kitchen b complex vitamins capsule Take 1 capsule by mouth daily.    . cetirizine (ZYRTEC) 10 MG tablet Take 10 mg by mouth daily as needed (for seasonal allergies.).    Mar[place] Kitchen diazepam (VALIUM) 5 MG tablet Take 2.5 mg by mouth at bedtime. for anxiety  1  . diphenhydrAMINE (BENADRYL) 25 mg capsule Take 25 mg by mouth daily as needed (allergies.).    Marland Kitchen fluticasone (FLONASE) 50 MCG/ACT nasal spray Place 2 sprays into the nose daily. (Patient taking differently: Place 2 sprays into the nose daily as needed (for seasonal allergies.). ) 16 g 11  . hydrochlorothiazide (MICROZIDE) 12.5 MG capsule Take 12.5 mg by mouth daily.  5  . ibuprofen (ADVIL,MOTRIN) 200 MG tablet Take 200 mg by mouth daily as needed (for pain.).    Mar[place] Kitchen loratadine (CLARITIN) 10 MG tablet Take 10 mg by mouth daily as needed (for seasonal allergies).     . losartan (COZAAR) 100 MG tablet Take 50 mg by mouth 2 (two) times daily.  5  . Magnesium 250 MG TABS Take 250 mg by mouth 2 (two) times daily.     .  methimazole (TAPAZOLE) 5 MG tablet Take 5 mg by mouth daily.     . Multiple Vitamin (MULTIVITAMIN WITH MINERALS) TABS tablet Take 1 tablet by mouth daily. Men's One-A-Day    . pantoprazole (PROTONIX) 40 MG tablet TAKE ONE TABLET BY MOUTH TWICE DAILY BEFORE A MEAL (Patient taking differently: TAKE ONE TABLET BY MOUTH DAILY BEFORE A MEAL IN THE MORNING.) 60 tablet 0  .  simvastatin (ZOCOR) 40 MG tablet Take 40 mg by mouth every evening.     . metroNIDAZOLE (FLAGYL) 500 MG tablet     . neomycin (MYCIFRADIN) 500 MG tablet      No current facility-administered medications for this visit.     REVIEW OF SYSTEMS:   Constitutional: Denies fevers, chills or abnormal night sweats Eyes: Denies blurriness of vision, double vision or watery eyes Ears, nose, mouth, throat, and face: Denies mucositis or sore throat Respiratory: Denies cough, dyspnea or wheezes Cardiovascular: Denies palpitation, chest discomfort or lower extremity swelling Gastrointestinal:  Denies nausea, heartburn or change in bowel habits Skin: Denies abnormal skin rashes Lymphatics: Denies new lymphadenopathy or easy bruising Neurological:Denies numbness, tingling or new weaknesses Behavioral/Psych: Mood is stable, no new changes  All other systems were reviewed with the patient and are negative.  PHYSICAL EXAMINATION: ECOG PERFORMANCE STATUS: 0 - Asymptomatic  Vitals:   07/17/18 1150  BP: 138/85  Pulse: 62  Resp: 18  Temp: 98 F (36.7 C)  SpO2: 99%   Filed Weights   07/17/18 1150  Weight: 223 lb 3.2 oz (101.2 kg)    GENERAL:alert, no distress and comfortable SKIN: skin color, texture, turgor are normal, no rashes or significant lesions EYES: normal, conjunctiva are pink and non-injected, sclera clear OROPHARYNX:no exudate, no erythema and lips, buccal mucosa, and tongue normal  NECK: supple, thyroid normal size, non-tender, without nodularity LYMPH:  no palpable lymphadenopathy in the cervical, axillary or  inguinal LUNGS: clear to auscultation and percussion with normal breathing effort HEART: regular rate & rhythm and no murmurs and no lower extremity edema ABDOMEN:abdomen soft, non-tender and normal bowel sounds Musculoskeletal:no cyanosis of digits and no clubbing  PSYCH: alert & oriented x 3 with fluent speech NEURO: no focal motor/sensory deficits  LABORATORY DATA:  I have reviewed the data as listed Lab Results  Component Value Date   WBC 4.7 07/05/2018   HGB 15.4 07/05/2018   HCT 44.8 07/05/2018   MCV 91.6 07/05/2018   PLT 257 07/05/2018     Chemistry      Component Value Date/Time   NA 139 07/05/2018 1005   K 3.5 07/05/2018 1005   CL 101 07/05/2018 1005   CO2 30 07/05/2018 1005   BUN 10 07/05/2018 1005   CREATININE 0.95 07/05/2018 1005      Component Value Date/Time   CALCIUM 9.1 07/05/2018 1005   ALKPHOS 78 07/05/2018 1005   AST 23 07/05/2018 1005   ALT 27 07/05/2018 1005   BILITOT 1.0 07/05/2018 1005       RADIOGRAPHIC STUDIES: I have personally reviewed the radiological images as listed and agreed with the findings in the report. Ct Chest W Contrast  Result Date: 07/10/2018 CLINICAL DATA:  Staging colorectal carcinoma EXAM: CT CHEST, ABDOMEN, AND PELVIS WITH CONTRAST TECHNIQUE: Multidetector CT imaging of the chest, abdomen and pelvis was performed following the standard protocol during bolus administration of intravenous contrast. CONTRAST:  172m ISOVUE-300 IOPAMIDOL (ISOVUE-300) INJECTION 61% COMPARISON:  None FINDINGS: CT CHEST FINDINGS Cardiovascular: No significant vascular findings. Normal heart size. No pericardial effusion. Mediastinum/Nodes: No enlarged mediastinal, hilar, or axillary lymph nodes. Thyroid gland, trachea, and esophagus demonstrate no significant findings. Lungs/Pleura: No pleural effusion. No airspace consolidation, atelectasis or pneumothorax. No suspicious pulmonary nodules or masses. Musculoskeletal: No aggressive lytic or sclerotic bone  lesions identified. CT ABDOMEN PELVIS FINDINGS Hepatobiliary: No focal liver abnormality is seen. No gallstones, gallbladder wall thickening, or biliary dilatation. Pancreas: Unremarkable. No pancreatic ductal dilatation or  surrounding inflammatory changes. Spleen: Normal in size without focal abnormality. Adrenals/Urinary Tract: The adrenal glands appear within normal limits. Unremarkable appearance of the right kidney. Left kidney cyst is noted measuring 1.2 cm. No mass or hydronephrosis identified. Urinary bladder appears normal. Stomach/Bowel: Normal appearance of the stomach. The small bowel loops have a normal course and caliber. The appendix is visualized and appears normal. No pathologic dilatation of the colon. The sigmoid colon and rectum are decompressed. Mass involving the rectosigmoid colon is identified measuring 3.7 cm in length with a diameter of approximately 2.3 cm. Nonobstructing, image, image 121/2 and image 98/4. Vascular/Lymphatic: Normal appearance of the abdominal aorta. No enlarged lymph nodes within the upper abdomen. No pelvic or inguinal adenopathy. No significant perirectal adenopathy. Reproductive: Prostate is unremarkable. Other: No ascites.  No peritoneal nodularity or mass. Musculoskeletal: No aggressive appearing bone lesions. IMPRESSION: 1. Mass within the rectosigmoid colon is identified compatible with findings from recent colonoscopy. 2. No evidence for nodal metastasis or distant metastatic disease. 3. Left kidney cyst. Electronically Signed   By: Kerby Moors M.D.   On: 07/10/2018 14:47   Mr Pelvis W Wo Contrast  Result Date: 07/17/2018 CLINICAL DATA:  Newly diagnosed rectal cancer, for staging EXAM: MRI PELVIS WITHOUT AND WITH CONTRAST TECHNIQUE: Multiplanar multisequence MR imaging of the pelvis was performed both before and after administration of intravenous contrast. Small amount of [place] gel was administered per rectum to optimize tumor evaluation. CONTRAST:  28m  MULTIHANCE GADOBENATE DIMEGLUMINE 529 MG/ML IV SOLN COMPARISON:  None. FINDINGS: TUMOR LOCATION Location from Anal Verge:  High 10-15cm Shortest Distance from Tumor to Anal Sphincter:  14 cm TUMOR DESCRIPTION Circumferential Extent: Along the right lateral aspect of the upper rectum, extending from 11:00 to 7:00 (series 9/image 13). Craniocaudal Extent:  4.2 cm T - CATEGORY Extension through Muscularis Propria:  No = T1 or Maximum extension beyond Muscularis Propria:  0 mm (early T3 = <558m advanced T3 = >48m13mExtramural vascular invasion/tumor thrombus:  No Shortest distance of any tumor/node from Mesorectal Fascia:  14 mm Invasion of Anterior Peritoneal Reflection:  No Involvement of Adjacent Organs or Pelvic Sidewall Structures:  No N - CATEGORY Mesorectal Lymph Nodes >=48mm43mNo = N0 Extra-mesorectal Lymphadenopathy:  No Other: None. IMPRESSION: 4.2 cm polypoid lesion along the right lateral aspect of the upper rectum, corresponding to the patient's biopsy-proven rectal adenocarcinoma, as above. Rectal adenocarcinoma T stage:  T1 or T2 Rectal adenocarcinoma N stage:  N0 Distance from tumor to the anal sphincter is 14 cm. Electronically Signed   By: SriyJulian Hy.   On: 07/17/2018 09:55   Ct Abdomen Pelvis W Contrast  Result Date: 07/10/2018 CLINICAL DATA:  Staging colorectal carcinoma EXAM: CT CHEST, ABDOMEN, AND PELVIS WITH CONTRAST TECHNIQUE: Multidetector CT imaging of the chest, abdomen and pelvis was performed following the standard protocol during bolus administration of intravenous contrast. CONTRAST:  100mL64mVUE-300 IOPAMIDOL (ISOVUE-300) INJECTION 61% COMPARISON:  None FINDINGS: CT CHEST FINDINGS Cardiovascular: No significant vascular findings. Normal heart size. No pericardial effusion. Mediastinum/Nodes: No enlarged mediastinal, hilar, or axillary lymph nodes. Thyroid gland, trachea, and esophagus demonstrate no significant findings. Lungs/Pleura: No pleural effusion. No airspace  consolidation, atelectasis or pneumothorax. No suspicious pulmonary nodules or masses. Musculoskeletal: No aggressive lytic or sclerotic bone lesions identified. CT ABDOMEN PELVIS FINDINGS Hepatobiliary: No focal liver abnormality is seen. No gallstones, gallbladder wall thickening, or biliary dilatation. Pancreas: Unremarkable. No pancreatic ductal dilatation or surrounding inflammatory changes. Spleen: Normal in size without focal  abnormality. Adrenals/Urinary Tract: The adrenal glands appear within normal limits. Unremarkable appearance of the right kidney. Left kidney cyst is noted measuring 1.2 cm. No mass or hydronephrosis identified. Urinary bladder appears normal. Stomach/Bowel: Normal appearance of the stomach. The small bowel loops have a normal course and caliber. The appendix is visualized and appears normal. No pathologic dilatation of the colon. The sigmoid colon and rectum are decompressed. Mass involving the rectosigmoid colon is identified measuring 3.7 cm in length with a diameter of approximately 2.3 cm. Nonobstructing, image, image 121/2 and image 98/4. Vascular/Lymphatic: Normal appearance of the abdominal aorta. No enlarged lymph nodes within the upper abdomen. No pelvic or inguinal adenopathy. No significant perirectal adenopathy. Reproductive: Prostate is unremarkable. Other: No ascites.  No peritoneal nodularity or mass. Musculoskeletal: No aggressive appearing bone lesions. IMPRESSION: 1. Mass within the rectosigmoid colon is identified compatible with findings from recent colonoscopy. 2. No evidence for nodal metastasis or distant metastatic disease. 3. Left kidney cyst. Electronically Signed   By: Kerby Moors M.D.   On: 07/10/2018 14:47    ASSESSMENT & PLAN:  Rectal cancer ([place]) 1.  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 - Colonoscopy on 07/05/2018 shows a rectal mass, biopsy consistent with invasive  moderately differentiated adenocarcinoma. - CT CAP on 07/10/2018 showing mass within the rectosigmoid colon, no evidence of nodal or distant metastatic disease. - MRI of the pelvis rectal protocol showing 4.2 cm polypoid lesion along the right lateral aspect of the upper rectum, 14 cm from anal sphincter, T stage: T1 or T2, N0. - I have discussed his diagnosis and treatment plan which includes transabdominal resection.  Since it is T1/2 N0, I did not recommend preoperative chemoradiation. -I will see him back 4 weeks after his surgery to discuss pathology and further plan of action.  2.  Family history: - Mother had breast cancer.  She later developed lung cancer and was a smoker.  Maternal aunt also had lung cancer. -Based on his young age her diagnosis, I would recommend genetic testing.  We will also follow-up on the MSI testing on the pathology.  No orders of the defined types were placed in this encounter.   All questions were answered. The patient knows to call the clinic with any problems, questions or concerns.     Derek Jack, MD 07/17/2018 12:36 PM

## 2018-07-18 ENCOUNTER — Ambulatory Visit: Payer: Self-pay | Admitting: General Surgery

## 2018-07-18 ENCOUNTER — Telehealth: Payer: Self-pay | Admitting: Radiation Oncology

## 2018-07-18 NOTE — Telephone Encounter (Signed)
l called the patient to let him know GI conference discussion this am. He will await surgical recommendations from Dr. Marcello Moores.

## 2018-07-30 NOTE — Progress Notes (Signed)
EKG 06-28-18 Epic   CT CHEST 07-10-18 Epic

## 2018-07-30 NOTE — Patient Instructions (Addendum)
Jacob Hayes  07/30/2018   Your procedure is scheduled on: 08-07-18   Report to [facility] Main  Entrance    Report to admitting at 11:00AM    Call this number if you have problems the morning of surgery [phone]      Remember: Do not eat food After Midnight. YOU MAY HAVE CLEAR LIQUIDS FROM MIDNIGHT UNTIL 7:00AM. NOTHING BY MOUTH AFTER 7:00AM!      CLEAR LIQUID DIET   Foods Allowed                                                                     Foods Excluded  Coffee and tea, regular and decaf                             liquids that you cannot  Plain Jell-O in any flavor                                             see through such as: Fruit ices (not with fruit pulp)                                     milk, soups, orange juice  Iced Popsicles                                    All solid food Carbonated beverages, regular and diet                                    Cranberry, grape and apple juices Sports drinks like Gatorade Lightly seasoned clear broth or consume(fat free) Sugar, honey syrup  Sample Menu Breakfast                                Lunch                                     Supper Cranberry juice                    Beef broth                            Chicken broth Jell-O                                     Grape juice                           Apple juice Coffee  or tea                        Jell-O                                      Popsicle                                                Coffee or tea                        Coffee or tea  _____________________________________________________________________     Take these medicines the morning of surgery with A SIP OF WATER: METHIMAZOLE, PANTOPRAZOLE, ZYRTEC, PANTOPRAZOLE, TYLENOL                                 You may not have any metal on your body including hair pins and              piercings  Do not wear jewelry, make-up, lotions, powders or perfumes, deodorant                     Men may shave face and neck.   Do not bring valuables to the hospital. [place].  Contacts, dentures or bridgework may not be worn into surgery.  Leave suitcase in the car. After surgery it may be brought to your room.               Please read over the following fact sheets you were given: _____________________________________________________________________             [facility] - Preparing for Surgery Before surgery, you can play an important role.  Because skin is not sterile, your skin needs to be as free of germs as possible.  You can reduce the number of germs on your skin by washing with CHG (chlorahexidine gluconate) soap before surgery.  CHG is an antiseptic cleaner which kills germs and bonds with the skin to continue killing germs even after washing. Please DO NOT use if you have an allergy to CHG or antibacterial soaps.  If your skin becomes reddened/irritated stop using the CHG and inform your nurse when you arrive at Short Stay. Do not shave (including legs and underarms) for at least 48 hours prior to the first CHG shower.  You may shave your face/neck. Please follow these instructions carefully:  1.  Shower with CHG Soap the night before surgery and the  morning of Surgery.  2.  If you choose to wash your hair, wash your hair first as usual with your  normal  shampoo.  3.  After you shampoo, rinse your hair and body thoroughly to remove the  shampoo.                           4.  Use CHG as you would any other liquid soap.  You can apply chg directly  to the skin and wash  Gently with a scrungie or clean washcloth.  5.  Apply the CHG Soap to your body ONLY FROM THE NECK DOWN.   Do not use on face/ open                           Wound or open sores. Avoid contact with eyes, ears mouth and genitals (private parts).                       Wash face,  Genitals (private parts) with your  normal soap.             6.  Wash thoroughly, paying special attention to the area where your surgery  will be performed.  7.  Thoroughly rinse your body with warm water from the neck down.  8.  DO NOT shower/wash with your normal soap after using and rinsing off  the CHG Soap.                9.  Pat yourself dry with a clean towel.            10.  Wear clean pajamas.            11.  Place clean sheets on your bed the night of your first shower and do not  sleep with pets. Day of Surgery : Do not apply any lotions/deodorants the morning of surgery.  Please wear clean clothes to the hospital/surgery center.  FAILURE TO FOLLOW THESE INSTRUCTIONS MAY RESULT IN THE CANCELLATION OF YOUR SURGERY PATIENT SIGNATURE_________________________________  NURSE SIGNATURE__________________________________  ________________________________________________________________________   Jacob Hayes  An incentive spirometer is a tool that can help keep your lungs clear and active. This tool measures how well you are filling your lungs with each breath. Taking long deep breaths may help reverse or decrease the chance of developing breathing (pulmonary) problems (especially infection) following:  A long period of time when you are unable to move or be active. BEFORE THE PROCEDURE   If the spirometer includes an indicator to show your best effort, your nurse or respiratory therapist will set it to a desired goal.  If possible, sit up straight or lean slightly forward. Try not to slouch.  Hold the incentive spirometer in an upright position. INSTRUCTIONS FOR USE  1. Sit on the edge of your bed if possible, or sit up as far as you can in bed or on a chair. 2. Hold the incentive spirometer in an upright position. 3. Breathe out normally. 4. Place the mouthpiece in your mouth and seal your lips tightly around it. 5. Breathe in slowly and as deeply as possible, raising the piston or the ball toward the  top of the column. 6. Hold your breath for 3-5 seconds or for as long as possible. Allow the piston or ball to fall to the bottom of the column. 7. Remove the mouthpiece from your mouth and breathe out normally. 8. Rest for a few seconds and repeat Steps 1 through 7 at least 10 times every 1-2 hours when you are awake. Take your time and take a few normal breaths between deep breaths. 9. The spirometer may include an indicator to show your best effort. Use the indicator as a goal to work toward during each repetition. 10. After each set of 10 deep breaths, practice coughing to be sure your lungs are clear. If you have an incision (the cut made at the time of  surgery), support your incision when coughing by placing a pillow or rolled up towels firmly against it. Once you are able to get out of bed, walk around indoors and cough well. You may stop using the incentive spirometer when instructed by your caregiver.  RISKS AND COMPLICATIONS  Take your time so you do not get dizzy or light-headed.  If you are in pain, you may need to take or ask for pain medication before doing incentive spirometry. It is harder to take a deep breath if you are having pain. AFTER USE  Rest and breathe slowly and easily.  It can be helpful to keep track of a log of your progress. Your caregiver can provide you with a simple table to help with this. If you are using the spirometer at home, follow these instructions: [place] IF:   You are having difficultly using the spirometer.  You have trouble using the spirometer as often as instructed.  Your pain medication is not giving enough relief while using the spirometer.  You develop fever of 100.5 F (38.1 C) or higher. SEEK IMMEDIATE MEDICAL CARE IF:   You cough up bloody sputum that had not been present before.  You develop fever of 102 F (38.9 C) or greater.  You develop worsening pain at or near the incision site. MAKE SURE YOU:   Understand  these instructions.  Will watch your condition.  Will get help right away if you are not doing well or get worse. Document Released: 03/20/2007 Document Revised: 01/30/2012 Document Reviewed: 05/21/2007 [facility] Patient Information 2014 [place], [place].   ________________________________________________________________________

## 2018-07-31 ENCOUNTER — Encounter (HOSPITAL_COMMUNITY)
Admission: RE | Admit: 2018-07-31 | Discharge: 2018-07-31 | Disposition: A | Discharge status: Home / Self Care | Payer: Commercial Managed Care - PPO | Source: Ambulatory Visit | Attending: General Surgery | Admitting: General Surgery

## 2018-07-31 ENCOUNTER — Encounter (HOSPITAL_COMMUNITY): Payer: Self-pay

## 2018-07-31 ENCOUNTER — Other Ambulatory Visit: Payer: Self-pay

## 2018-07-31 DIAGNOSIS — C189 Malignant neoplasm of colon, unspecified: Secondary | ICD-10-CM | POA: Diagnosis not present

## 2018-07-31 DIAGNOSIS — Z01812 Encounter for preprocedural laboratory examination: Secondary | ICD-10-CM | POA: Diagnosis present

## 2018-07-31 HISTORY — DX: Meniere's disease, unspecified ear: H81.09

## 2018-07-31 LAB — BASIC METABOLIC PANEL
ANION GAP: 6 (ref 5–15)
BUN: 11 mg/dL (ref 6–20)
CALCIUM: 9.5 mg/dL (ref 8.9–10.3)
CO2: 30 mmol/L (ref 22–32)
Chloride: 105 mmol/L (ref 98–111)
Creatinine, Ser: 0.91 mg/dL (ref 0.61–1.24)
GFR calc Af Amer: >60 mL/min (ref >60)
GFR calc non Af Amer: >60 mL/min (ref >60)
GLUCOSE: 86 mg/dL (ref 70–99)
POTASSIUM: 4.1 mmol/L (ref 3.5–5.1)
Sodium: 141 mmol/L (ref 135–145)

## 2018-07-31 LAB — CBC
HEMATOCRIT: 43.2 % (ref 39.0–52.0)
HEMOGLOBIN: 14.9 g/dL (ref 13.0–17.0)
MCH: 31.2 pg (ref 26.0–34.0)
MCHC: 34.5 g/dL (ref 30.0–36.0)
MCV: 90.6 fL (ref 78.0–100.0)
Platelets: 299 10*3/uL (ref 150–400)
RBC: 4.77 MIL/uL (ref 4.22–5.81)
RDW: 12.3 % (ref 11.5–15.5)
WBC: 4.4 10*3/uL (ref 4.0–10.5)

## 2018-08-01 LAB — ABO/RH: ABO/RH(D): A POS

## 2018-08-06 MED ORDER — BUPIVACAINE LIPOSOME 1.3 % IJ SUSP
20.0000 mL | Freq: Once | INTRAMUSCULAR | Status: DC
  Filled 2018-08-06: qty 20

## 2018-08-07 ENCOUNTER — Encounter (HOSPITAL_COMMUNITY): Payer: Self-pay | Admitting: *Deleted

## 2018-08-07 ENCOUNTER — Inpatient Hospital Stay (HOSPITAL_COMMUNITY)
Admission: RE | Admit: 2018-08-07 | Discharge: 2018-08-10 | DRG: 330 | Disposition: A | Discharge status: Home / Self Care | Payer: Commercial Managed Care - PPO | Attending: General Surgery | Admitting: General Surgery

## 2018-08-07 ENCOUNTER — Inpatient Hospital Stay (HOSPITAL_COMMUNITY): Payer: Commercial Managed Care - PPO | Admitting: Certified Registered"

## 2018-08-07 ENCOUNTER — Other Ambulatory Visit: Payer: Self-pay

## 2018-08-07 ENCOUNTER — Encounter (HOSPITAL_COMMUNITY)
Admission: RE | Disposition: A | Discharge status: Home / Self Care | Payer: Self-pay | Source: Home / Self Care | Attending: General Surgery

## 2018-08-07 DIAGNOSIS — C19 Malignant neoplasm of rectosigmoid junction: Principal | ICD-10-CM | POA: Diagnosis present

## 2018-08-07 DIAGNOSIS — C187 Malignant neoplasm of sigmoid colon: Secondary | ICD-10-CM | POA: Diagnosis present

## 2018-08-07 DIAGNOSIS — Z79899 Other long term (current) drug therapy: Secondary | ICD-10-CM

## 2018-08-07 DIAGNOSIS — E785 Hyperlipidemia, unspecified: Secondary | ICD-10-CM | POA: Diagnosis present

## 2018-08-07 DIAGNOSIS — C772 Secondary and unspecified malignant neoplasm of intra-abdominal lymph nodes: Secondary | ICD-10-CM | POA: Diagnosis present

## 2018-08-07 DIAGNOSIS — I10 Essential (primary) hypertension: Secondary | ICD-10-CM | POA: Diagnosis present

## 2018-08-07 DIAGNOSIS — Z8249 Family history of ischemic heart disease and other diseases of the circulatory system: Secondary | ICD-10-CM | POA: Diagnosis not present

## 2018-08-07 LAB — TYPE AND SCREEN
ABO/RH(D): A POS
Antibody Screen: NEGATIVE

## 2018-08-07 SURGERY — COLECTOMY, PARTIAL, ROBOT-ASSISTED, LAPAROSCOPIC
Anesthesia: General

## 2018-08-07 MED ORDER — FENTANYL CITRATE (PF) 100 MCG/2ML IJ SOLN
INTRAMUSCULAR | Status: AC
  Filled 2018-08-07: qty 2

## 2018-08-07 MED ORDER — BUPIVACAINE LIPOSOME 1.3 % IJ SUSP
INTRAMUSCULAR | Status: DC | PRN
  Administered 2018-08-07: 20 mL

## 2018-08-07 MED ORDER — FENTANYL CITRATE (PF) 100 MCG/2ML IJ SOLN
INTRAMUSCULAR | Status: DC | PRN
  Administered 2018-08-07: 100 ug via INTRAVENOUS
  Administered 2018-08-07 (×2): 50 ug via INTRAVENOUS

## 2018-08-07 MED ORDER — ENSURE SURGERY PO LIQD
237.0000 mL | Freq: Two times a day (BID) | ORAL | Status: DC
  Administered 2018-08-08 – 2018-08-10 (×5): 237 mL via ORAL
  Filled 2018-08-07 (×6): qty 237

## 2018-08-07 MED ORDER — LORATADINE 10 MG PO TABS
10.0000 mg | ORAL_TABLET | Freq: Every day | ORAL | Status: DC
  Filled 2018-08-07: qty 1

## 2018-08-07 MED ORDER — LACTATED RINGERS IV SOLN
INTRAVENOUS | Status: DC
  Administered 2018-08-07 (×2): via INTRAVENOUS

## 2018-08-07 MED ORDER — SODIUM CHLORIDE 0.9 % IV SOLN
2.0000 g | Freq: Two times a day (BID) | INTRAVENOUS | Status: AC
  Administered 2018-08-08: 2 g via INTRAVENOUS
  Filled 2018-08-07: qty 2

## 2018-08-07 MED ORDER — SUGAMMADEX SODIUM 200 MG/2ML IV SOLN
INTRAVENOUS | Status: DC | PRN
  Administered 2018-08-07: 200 mg via INTRAVENOUS

## 2018-08-07 MED ORDER — GABAPENTIN 300 MG PO CAPS
300.0000 mg | ORAL_CAPSULE | ORAL | Status: AC
  Administered 2018-08-07: 300 mg via ORAL
  Filled 2018-08-07: qty 1

## 2018-08-07 MED ORDER — MIDAZOLAM HCL 5 MG/5ML IJ SOLN
INTRAMUSCULAR | Status: DC | PRN
  Administered 2018-08-07: 2 mg via INTRAVENOUS

## 2018-08-07 MED ORDER — BUPIVACAINE-EPINEPHRINE (PF) 0.25% -1:200000 IJ SOLN
INTRAMUSCULAR | Status: AC
  Filled 2018-08-07: qty 30

## 2018-08-07 MED ORDER — PROPOFOL 10 MG/ML IV BOLUS
INTRAVENOUS | Status: DC | PRN
  Administered 2018-08-07: 200 mg via INTRAVENOUS

## 2018-08-07 MED ORDER — ROCURONIUM BROMIDE 100 MG/10ML IV SOLN
INTRAVENOUS | Status: DC | PRN
  Administered 2018-08-07: 70 mg via INTRAVENOUS
  Administered 2018-08-07: 10 mg via INTRAVENOUS
  Administered 2018-08-07: 20 mg via INTRAVENOUS

## 2018-08-07 MED ORDER — ALVIMOPAN 12 MG PO CAPS
12.0000 mg | ORAL_CAPSULE | Freq: Two times a day (BID) | ORAL | Status: DC
  Filled 2018-08-07 (×2): qty 1

## 2018-08-07 MED ORDER — PROPOFOL 10 MG/ML IV BOLUS
INTRAVENOUS | Status: AC
  Filled 2018-08-07: qty 20

## 2018-08-07 MED ORDER — OXYCODONE HCL 5 MG PO TABS: 5.0000 mg | ORAL_TABLET | Freq: Once | ORAL | Status: DC | PRN

## 2018-08-07 MED ORDER — ENOXAPARIN SODIUM 40 MG/0.4ML ~~LOC~~ SOLN
40.0000 mg | SUBCUTANEOUS | Status: DC
  Administered 2018-08-08 – 2018-08-10 (×3): 40 mg via SUBCUTANEOUS
  Filled 2018-08-07 (×3): qty 0.4

## 2018-08-07 MED ORDER — 0.9 % SODIUM CHLORIDE (POUR BTL) OPTIME
TOPICAL | Status: DC | PRN
  Administered 2018-08-07: 1000 mL

## 2018-08-07 MED ORDER — ALUM & MAG HYDROXIDE-SIMETH 200-200-20 MG/5ML PO SUSP: 30.0000 mL | Freq: Four times a day (QID) | ORAL | Status: DC | PRN

## 2018-08-07 MED ORDER — ACETAMINOPHEN 500 MG PO TABS
1000.0000 mg | ORAL_TABLET | ORAL | Status: AC
  Administered 2018-08-07: 1000 mg via ORAL
  Filled 2018-08-07: qty 2

## 2018-08-07 MED ORDER — HYDROMORPHONE HCL 1 MG/ML IJ SOLN
0.5000 mg | INTRAMUSCULAR | Status: DC | PRN
  Administered 2018-08-07: 0.5 mg via INTRAVENOUS
  Filled 2018-08-07: qty 0.5

## 2018-08-07 MED ORDER — ONDANSETRON HCL 4 MG PO TABS: 4.0000 mg | ORAL_TABLET | Freq: Four times a day (QID) | ORAL | Status: DC | PRN

## 2018-08-07 MED ORDER — HYDROCHLOROTHIAZIDE 12.5 MG PO CAPS
12.5000 mg | ORAL_CAPSULE | Freq: Every day | ORAL | Status: DC
  Administered 2018-08-08 – 2018-08-10 (×3): 12.5 mg via ORAL
  Filled 2018-08-07 (×3): qty 1

## 2018-08-07 MED ORDER — LIDOCAINE IN D5W 4-5 MG/ML-% IV SOLN
INTRAVENOUS | Status: DC | PRN
  Administered 2018-08-07: 1.5 ug/kg/min via INTRAVENOUS

## 2018-08-07 MED ORDER — DEXAMETHASONE SODIUM PHOSPHATE 10 MG/ML IJ SOLN
INTRAMUSCULAR | Status: DC | PRN
  Administered 2018-08-07: 10 mg via INTRAVENOUS

## 2018-08-07 MED ORDER — SACCHAROMYCES BOULARDII 250 MG PO CAPS
250.0000 mg | ORAL_CAPSULE | Freq: Two times a day (BID) | ORAL | Status: DC
  Administered 2018-08-07 – 2018-08-10 (×6): 250 mg via ORAL
  Filled 2018-08-07 (×6): qty 1

## 2018-08-07 MED ORDER — METHYLENE BLUE 0.5 % INJ SOLN
INTRAVENOUS | Status: AC
  Filled 2018-08-07: qty 10

## 2018-08-07 MED ORDER — PANTOPRAZOLE SODIUM 40 MG PO TBEC
40.0000 mg | DELAYED_RELEASE_TABLET | Freq: Every day | ORAL | Status: DC
  Administered 2018-08-08 – 2018-08-10 (×3): 40 mg via ORAL
  Filled 2018-08-07 (×3): qty 1

## 2018-08-07 MED ORDER — DIAZEPAM 5 MG PO TABS
2.5000 mg | ORAL_TABLET | Freq: Every day | ORAL | Status: DC
  Administered 2018-08-07 – 2018-08-09 (×3): 2.5 mg via ORAL
  Filled 2018-08-07 (×3): qty 1

## 2018-08-07 MED ORDER — OXYCODONE HCL 5 MG/5ML PO SOLN: 5.0000 mg | Freq: Once | ORAL | Status: DC | PRN

## 2018-08-07 MED ORDER — KETAMINE HCL 10 MG/ML IJ SOLN
INTRAMUSCULAR | Status: AC
  Filled 2018-08-07: qty 1

## 2018-08-07 MED ORDER — DIPHENHYDRAMINE HCL 25 MG PO CAPS: 25.0000 mg | ORAL_CAPSULE | Freq: Every day | ORAL | Status: DC | PRN

## 2018-08-07 MED ORDER — ALVIMOPAN 12 MG PO CAPS
12.0000 mg | ORAL_CAPSULE | ORAL | Status: AC
  Administered 2018-08-07: 12 mg via ORAL
  Filled 2018-08-07: qty 1

## 2018-08-07 MED ORDER — MEPERIDINE HCL 50 MG/ML IJ SOLN: 6.2500 mg | INTRAMUSCULAR | Status: DC | PRN

## 2018-08-07 MED ORDER — LIDOCAINE 2% (20 MG/ML) 5 ML SYRINGE
INTRAMUSCULAR | Status: DC | PRN
  Administered 2018-08-07: 80 mg via INTRAVENOUS

## 2018-08-07 MED ORDER — SODIUM CHLORIDE 0.9 % IV SOLN
2.0000 g | INTRAVENOUS | Status: AC
  Administered 2018-08-07: 2 g via INTRAVENOUS
  Filled 2018-08-07: qty 2

## 2018-08-07 MED ORDER — KCL IN DEXTROSE-NACL 20-5-0.45 MEQ/L-%-% IV SOLN
INTRAVENOUS | Status: DC
  Administered 2018-08-07: 18:00:00 via INTRAVENOUS
  Filled 2018-08-07: qty 1000

## 2018-08-07 MED ORDER — KETAMINE HCL 10 MG/ML IJ SOLN
INTRAMUSCULAR | Status: DC | PRN
  Administered 2018-08-07: 45 mg via INTRAVENOUS

## 2018-08-07 MED ORDER — ONDANSETRON HCL 4 MG/2ML IJ SOLN
INTRAMUSCULAR | Status: DC | PRN
  Administered 2018-08-07 (×2): 4 mg via INTRAVENOUS

## 2018-08-07 MED ORDER — METHIMAZOLE 5 MG PO TABS
5.0000 mg | ORAL_TABLET | Freq: Every day | ORAL | Status: DC
  Administered 2018-08-08 – 2018-08-10 (×3): 5 mg via ORAL
  Filled 2018-08-07 (×3): qty 1

## 2018-08-07 MED ORDER — LOSARTAN POTASSIUM 50 MG PO TABS
50.0000 mg | ORAL_TABLET | Freq: Two times a day (BID) | ORAL | Status: DC
  Administered 2018-08-08 – 2018-08-10 (×4): 50 mg via ORAL
  Filled 2018-08-07 (×5): qty 1

## 2018-08-07 MED ORDER — TRAMADOL HCL 50 MG PO TABS
50.0000 mg | ORAL_TABLET | Freq: Four times a day (QID) | ORAL | Status: DC | PRN
  Administered 2018-08-08: 50 mg via ORAL
  Filled 2018-08-07: qty 1

## 2018-08-07 MED ORDER — RINGERS IRRIGATION IR SOLN
Status: DC | PRN
  Administered 2018-08-07: 1000 mL

## 2018-08-07 MED ORDER — BUPIVACAINE-EPINEPHRINE 0.25% -1:200000 IJ SOLN
INTRAMUSCULAR | Status: DC | PRN
  Administered 2018-08-07: 30 mL

## 2018-08-07 MED ORDER — PROMETHAZINE HCL 25 MG/ML IJ SOLN: 6.2500 mg | INTRAMUSCULAR | Status: DC | PRN

## 2018-08-07 MED ORDER — ONDANSETRON HCL 4 MG/2ML IJ SOLN
4.0000 mg | Freq: Four times a day (QID) | INTRAMUSCULAR | Status: DC | PRN
  Administered 2018-08-07: 4 mg via INTRAVENOUS
  Filled 2018-08-07: qty 2

## 2018-08-07 MED ORDER — HEPARIN SODIUM (PORCINE) 5000 UNIT/ML IJ SOLN
5000.0000 [IU] | Freq: Once | INTRAMUSCULAR | Status: AC
  Administered 2018-08-07: 5000 [IU] via SUBCUTANEOUS
  Filled 2018-08-07: qty 1

## 2018-08-07 MED ORDER — HYDROMORPHONE HCL 1 MG/ML IJ SOLN: 0.2500 mg | INTRAMUSCULAR | Status: DC | PRN

## 2018-08-07 SURGICAL SUPPLY — 95 items
BLADE EXTENDED COATED 6.5IN (ELECTRODE) IMPLANT
CANNULA REDUC XI 12-8 STAPL (CANNULA) ×1
CANNULA REDUC XI 12-8MM STAPL (CANNULA) ×1
CANNULA REDUCER 12-8 DVNC XI (CANNULA) ×1 IMPLANT
CELLS DAT CNTRL 66122 CELL SVR (MISCELLANEOUS) IMPLANT
CLIP VESOLOCK LG 6/CT PURPLE (CLIP) IMPLANT
CLIP VESOLOCK MED 6/CT (CLIP) IMPLANT
COVER SURGICAL LIGHT HANDLE (MISCELLANEOUS) ×6 IMPLANT
COVER TIP SHEARS 8 DVNC (MISCELLANEOUS) ×1 IMPLANT
COVER TIP SHEARS 8MM DA VINCI (MISCELLANEOUS) ×2
DECANTER SPIKE VIAL GLASS SM (MISCELLANEOUS) ×3 IMPLANT
DERMABOND ADVANCED (GAUZE/BANDAGES/DRESSINGS) ×2
DERMABOND ADVANCED .7 DNX12 (GAUZE/BANDAGES/DRESSINGS) ×1 IMPLANT
DRAIN CHANNEL 19F RND (DRAIN) IMPLANT
DRAPE ARM DVNC X/XI (DISPOSABLE) ×4 IMPLANT
DRAPE COLUMN DVNC XI (DISPOSABLE) ×1 IMPLANT
DRAPE DA VINCI XI ARM (DISPOSABLE) ×8
DRAPE DA VINCI XI COLUMN (DISPOSABLE) ×2
DRAPE SURG IRRIG POUCH 19X23 (DRAPES) ×3 IMPLANT
DRSG OPSITE POSTOP 3X4 (GAUZE/BANDAGES/DRESSINGS) ×3 IMPLANT
DRSG OPSITE POSTOP 4X10 (GAUZE/BANDAGES/DRESSINGS) IMPLANT
DRSG OPSITE POSTOP 4X6 (GAUZE/BANDAGES/DRESSINGS) IMPLANT
DRSG OPSITE POSTOP 4X8 (GAUZE/BANDAGES/DRESSINGS) IMPLANT
ELECT PENCIL ROCKER SW 15FT (MISCELLANEOUS) ×3 IMPLANT
ELECT REM PT RETURN 15FT ADLT (MISCELLANEOUS) ×3 IMPLANT
ENDOLOOP SUT PDS II  0 18 (SUTURE)
ENDOLOOP SUT PDS II 0 18 (SUTURE) IMPLANT
EVACUATOR SILICONE 100CC (DRAIN) IMPLANT
GAUZE SPONGE 4X4 12PLY STRL (GAUZE/BANDAGES/DRESSINGS) IMPLANT
GLOVE BIO SURGEON STRL SZ 6.5 (GLOVE) ×6 IMPLANT
GLOVE BIO SURGEONS STRL SZ 6.5 (GLOVE) ×3
GLOVE BIOGEL PI IND STRL 7.0 (GLOVE) ×3 IMPLANT
GLOVE BIOGEL PI INDICATOR 7.0 (GLOVE) ×6
GOWN STRL REUS W/TWL 2XL LVL3 (GOWN DISPOSABLE) ×9 IMPLANT
GOWN STRL REUS W/TWL XL LVL3 (GOWN DISPOSABLE) ×12 IMPLANT
GRASPER ENDOPATH ANVIL 10MM (MISCELLANEOUS) IMPLANT
GRASPER SUT TROCAR 14GX15 (MISCELLANEOUS) IMPLANT
HOLDER FOLEY CATH W/STRAP (MISCELLANEOUS) ×3 IMPLANT
IRRIG SUCT STRYKERFLOW 2 WTIP (MISCELLANEOUS) ×3
IRRIGATION SUCT STRKRFLW 2 WTP (MISCELLANEOUS) ×1 IMPLANT
IRRIGATOR SUCT 8 DISP DVNC XI (IRRIGATION / IRRIGATOR) IMPLANT
IRRIGATOR SUCTION 8MM XI DISP (IRRIGATION / IRRIGATOR)
KIT PROCEDURE DA VINCI SI (MISCELLANEOUS) ×2
KIT PROCEDURE DVNC SI (MISCELLANEOUS) ×1 IMPLANT
KIT SIGMOIDOSCOPE (SET/KITS/TRAYS/PACK) ×3 IMPLANT
NEEDLE INSUFFLATION 14GA 120MM (NEEDLE) ×3 IMPLANT
PACK CARDIOVASCULAR III (CUSTOM PROCEDURE TRAY) ×3 IMPLANT
PACK COLON (CUSTOM PROCEDURE TRAY) ×3 IMPLANT
PORT LAP GEL ALEXIS MED 5-9CM (MISCELLANEOUS) IMPLANT
RTRCTR WOUND ALEXIS 18CM MED (MISCELLANEOUS)
SCISSORS LAP 5X35 DISP (ENDOMECHANICALS) ×3 IMPLANT
SEAL CANN UNIV 5-8 DVNC XI (MISCELLANEOUS) ×3 IMPLANT
SEAL XI 5MM-8MM UNIVERSAL (MISCELLANEOUS) ×6
SEALER VESSEL DA VINCI XI (MISCELLANEOUS) ×2
SEALER VESSEL EXT DVNC XI (MISCELLANEOUS) ×1 IMPLANT
SLEEVE ADV FIXATION 5X100MM (TROCAR) IMPLANT
SOLUTION ELECTROLUBE (MISCELLANEOUS) ×3 IMPLANT
STAPLER 45 BLU RELOAD XI (STAPLE) ×2 IMPLANT
STAPLER 45 BLUE RELOAD XI (STAPLE) ×4
STAPLER 45 GREEN RELOAD XI (STAPLE)
STAPLER 45 GRN RELOAD XI (STAPLE) IMPLANT
STAPLER AUT SUT 4.8 EEAXL 31 (STAPLE) ×3 IMPLANT
STAPLER CANNULA SEAL DVNC XI (STAPLE) ×1 IMPLANT
STAPLER CANNULA SEAL XI (STAPLE) ×2
STAPLER SHEATH (SHEATH) ×2
STAPLER SHEATH ENDOWRIST DVNC (SHEATH) ×1 IMPLANT
STAPLER VISISTAT 35W (STAPLE) IMPLANT
SUT ETHILON 2 0 PS N (SUTURE) IMPLANT
SUT NOVA NAB DX-16 0-1 5-0 T12 (SUTURE) ×6 IMPLANT
SUT PROLENE 2 0 KS (SUTURE) ×3 IMPLANT
SUT SILK 2 0 (SUTURE) ×2
SUT SILK 2 0 SH CR/8 (SUTURE) ×3 IMPLANT
SUT SILK 2-0 18XBRD TIE 12 (SUTURE) ×1 IMPLANT
SUT SILK 3 0 (SUTURE) ×2
SUT SILK 3 0 SH CR/8 (SUTURE) ×3 IMPLANT
SUT SILK 3-0 18XBRD TIE 12 (SUTURE) ×1 IMPLANT
SUT V-LOC BARB 180 2/0GR6 GS22 (SUTURE)
SUT VIC AB 2-0 SH 18 (SUTURE) ×3 IMPLANT
SUT VIC AB 2-0 SH 27 (SUTURE) ×2
SUT VIC AB 2-0 SH 27X BRD (SUTURE) ×1 IMPLANT
SUT VIC AB 3-0 SH 18 (SUTURE) ×3 IMPLANT
SUT VIC AB 4-0 PS2 18 (SUTURE) ×6 IMPLANT
SUT VIC AB 4-0 PS2 27 (SUTURE) ×6 IMPLANT
SUT VICRYL 0 UR6 27IN ABS (SUTURE) ×3 IMPLANT
SUTURE V-LC BRB 180 2/0GR6GS22 (SUTURE) IMPLANT
SYR 10ML ECCENTRIC (SYRINGE) ×3 IMPLANT
SYS LAPSCP GELPORT 120MM (MISCELLANEOUS)
SYSTEM LAPSCP GELPORT 120MM (MISCELLANEOUS) IMPLANT
TOWEL OR 17X26 10 PK STRL BLUE (TOWEL DISPOSABLE) IMPLANT
TOWEL OR NON WOVEN STRL DISP B (DISPOSABLE) ×3 IMPLANT
TRAY FOLEY MTR SLVR 16FR STAT (SET/KITS/TRAYS/PACK) ×3 IMPLANT
TROCAR ADV FIXATION 5X100MM (TROCAR) ×3 IMPLANT
TUBING CONNECTING 10 (TUBING) ×4 IMPLANT
TUBING CONNECTING 10' (TUBING) ×2
TUBING INSUFFLATION 10FT LAP (TUBING) ×3 IMPLANT

## 2018-08-07 NOTE — Anesthesia Procedure Notes (Signed)
Procedure Name: Intubation Date/Time: 08/07/2018 1:08 PM Performed by: Gwyndolyn Saxon, CRNA Pre-anesthesia Checklist: Patient identified, Emergency Drugs available, Suction available, Patient being monitored and Timeout performed Patient Re-evaluated:Patient Re-evaluated prior to induction Oxygen Delivery Method: Circle system utilized Preoxygenation: Pre-oxygenation with 100% oxygen Induction Type: IV induction Ventilation: Mask ventilation with difficulty Laryngoscope Size: Miller and 3 Grade View: Grade I Tube type: Oral Tube size: 7.5 mm Number of attempts: 1 Airway Equipment and Method: Patient positioned with wedge pillow Placement Confirmation: ETT inserted through vocal cords under direct vision,  positive ETCO2 and breath sounds checked- equal and bilateral Secured at: 25 cm Tube secured with: Tape Dental Injury: Teeth and Oropharynx as per pre-operative assessment

## 2018-08-07 NOTE — Transfer of Care (Signed)
Immediate Anesthesia Transfer of Care Note  Patient: Jacob Hayes  Procedure(s) Performed: XI ROBOTIC LOW ANTERIOR RESECTION (N/A )  Patient Location: PACU  Anesthesia Type:General  Level of Consciousness: sedated, drowsy and patient cooperative  Airway & Oxygen Therapy: Patient Spontanous Breathing and Patient connected to face mask oxygen  Post-op Assessment: Report given to RN and Post -op Vital signs reviewed and stable  Post vital signs: Reviewed and stable  Last Vitals:  Vitals Value Taken Time  BP 144/94 08/07/2018  3:45 PM  Temp    Pulse 91 08/07/2018  3:47 PM  Resp 17 08/07/2018  3:47 PM  SpO2 100 % 08/07/2018  3:47 PM  Vitals shown include unvalidated device data.  Last Pain:  Vitals:   08/07/18 1045  TempSrc: Oral         Complications: No apparent anesthesia complications

## 2018-08-07 NOTE — Anesthesia Postprocedure Evaluation (Signed)
Anesthesia Post Note  Patient: Jacob Hayes  Procedure(s) Performed: XI ROBOTIC LOW ANTERIOR RESECTION (N/A )     Patient location during evaluation: PACU Anesthesia Type: General Level of consciousness: awake and alert Pain management: pain level controlled Vital Signs Assessment: post-procedure vital signs reviewed and stable Respiratory status: spontaneous breathing, nonlabored ventilation and respiratory function stable Cardiovascular status: blood pressure returned to baseline and stable Postop Assessment: no apparent nausea or vomiting Anesthetic complications: no    Last Vitals:  Vitals:   08/07/18 1600 08/07/18 1615  BP: 131/83 (!) 125/97  Pulse: 88 75  Resp: 12 13  Temp:  36.4 C  SpO2: 100% 96%    Last Pain:  Vitals:   08/07/18 1615  TempSrc:   PainSc: 0-No pain                 Lynda Rainwater

## 2018-08-07 NOTE — H&P (Signed)
History of Present Illness Jacob Ruff MD; 1/61/0960 10:48 AM) The patient is a 37 year old male who presents with colorectal cancer. 37 year old male from [place] who presents to the office for evaluation of a newly diagnosed colon cancer. He states that he has had several months of rectal bleeding which was thought to be due to hemorrhoid disease. He did try some rectal suppositories with no change in his bleeding. He also noticed slightly looser bowel movements. His ex-wife works for [name] and they recommended a colonoscopy. This was done recently and showed several small polyps which were removed and a larger proximal rectal mass noted to be at approximately 15 cm. This was not tattooed. Biopsies show adenocarcinoma. CEA level was 1.4. CT scans are pending for today. Patient has a history of early onset hypertension and it is well controlled with medications. He has never had any abdominal surgeries before. He is a Dietitian at [employer].   Past Surgical History (Tanisha A. Owens Shark, [place]; 07/10/2018 9:53 AM) Colon Polyp Removal - Colonoscopy  Colon Removal - Complete   Diagnostic Studies History (Tanisha A. Owens Shark, [place]; 07/10/2018 9:53 AM) Colonoscopy  within last year  Allergies (Tanisha A. Owens Shark, [place]; 07/10/2018 9:54 AM) No Known Drug Allergies [07/10/2018]: Allergies Reconciled   Medication History (Tanisha A. Brown, RMA; 07/10/2018 9:55 AM) HydroCHLOROthiazide (12.5MG  Capsule, Oral) Active. Losartan Potassium (100MG  Tablet, Oral) Active. MethIMAzole (5MG  Tablet, Oral) Active. Pantoprazole Sodium (40MG  Tablet DR, Oral) Active. Mens One Daily (Oral) Active. Magnesium (250MG  Tablet, Oral) Active. Vitamin C (1000MG  Tablet, Oral) Active. B Complete (Oral) Active. Medications Reconciled  Social History (Tanisha A. Owens Shark, [place]; 07/10/2018 9:53 AM) Alcohol use  Occasional alcohol use. Caffeine use  Tea. No drug use  Tobacco  use  Never smoker.  Family History (Tanisha A. Owens Shark, [place]; 07/10/2018 9:53 AM) Anesthetic complications  Father. Breast Cancer  Mother. Cerebrovascular Accident  Father. Depression  Mother. Diabetes Mellitus  Mother. Heart Disease  Father. Hypertension  Father. Thyroid problems  Mother.  Other Problems (Tanisha A. Owens Shark, [place]; 07/10/2018 9:53 AM) Diverticulosis  Gastroesophageal Reflux Disease  High blood pressure  Hypercholesterolemia  Thyroid Disease     Review of Systems (Tanisha A. Brown RMA; 07/10/2018 9:53 AM) General Not Present- Appetite Loss, Chills, Fatigue, Fever, Night Sweats, Weight Gain and Weight Loss. Skin Not Present- Change in Wart/Mole, Dryness, Hives, Jaundice, New Lesions, Non-Healing Wounds, Rash and Ulcer. HEENT Not Present- Earache, Hearing Loss, Hoarseness, Nose Bleed, Oral Ulcers, Ringing in the Ears, Seasonal Allergies, Sinus Pain, Sore Throat, Visual Disturbances, Wears glasses/contact lenses and Yellow Eyes. Respiratory Not Present- Bloody sputum, Chronic Cough, Difficulty Breathing, Snoring and Wheezing. Breast Not Present- Breast Mass, Breast Pain, Nipple Discharge and Skin Changes. Cardiovascular Not Present- Chest Pain, Difficulty Breathing Lying Down, Leg Cramps, Palpitations, Rapid Heart Rate, Shortness of Breath and Swelling of Extremities. Gastrointestinal Present- Bloody Stool. Not Present- Abdominal Pain, Bloating, Change in Bowel Habits, Chronic diarrhea, Constipation, Difficulty Swallowing, Excessive gas, Gets full quickly at meals, Hemorrhoids, Indigestion, Nausea, Rectal Pain and Vomiting. Male Genitourinary Not Present- Blood in Urine, Change in Urinary Stream, Frequency, Impotence, Nocturia, Painful Urination, Urgency and Urine Leakage. Musculoskeletal Not Present- Back Pain, Joint Pain, Joint Stiffness, Muscle Pain, Muscle Weakness and Swelling of Extremities. Neurological Not Present- Decreased Memory, Fainting, Headaches,  Numbness, Seizures, Tingling, Tremor, Trouble walking and Weakness. Psychiatric Not Present- Anxiety, Bipolar, Change in Sleep Pattern, Depression, Fearful and Frequent crying. Endocrine Not Present- Cold Intolerance, Excessive Hunger, Hair Changes,  Heat Intolerance, Hot flashes and New Diabetes. Hematology Not Present- Blood Thinners, Easy Bruising, Excessive bleeding, Gland problems, HIV and Persistent Infections.  Vitals (Tanisha A. Brown RMA; 07/10/2018 9:54 AM) 07/10/2018 9:53 AM Weight: 220.6 lb Height: 75.5in Body Surface Area: 2.3 m Body Mass Index: 27.21 kg/m  Temp.: 97.86F  Pulse: 68 (Regular)  BP: 128/74 (Sitting, Left Arm, Standard)       Physical Exam Jacob Ruff MD; 05/18/3661 10:48 AM) General Mental Status-Alert. General Appearance-Not in acute distress. Build & Nutrition-Well nourished. Posture-Normal posture. Gait-Normal.  Head and Neck Head-normocephalic, atraumatic with no lesions or palpable masses. Trachea-midline.  Chest and Lung Exam Chest and lung exam reveals -on auscultation, normal breath sounds, no adventitious sounds and normal vocal resonance.  Cardiovascular Cardiovascular examination reveals -normal heart sounds, regular rate and rhythm with no murmurs and no digital clubbing, cyanosis, edema, increased warmth or tenderness.  Abdomen Inspection Inspection of the abdomen reveals - No Hernias. Palpation/Percussion Palpation and Percussion of the abdomen reveal - Soft, Non Tender, No Rigidity (guarding), No hepatosplenomegaly and No Palpable abdominal masses.  Neurologic Neurologic evaluation reveals -alert and oriented x 3 with no impairment of recent or remote memory, normal attention span and ability to concentrate, normal sensation and normal coordination.  Musculoskeletal Normal Exam - Bilateral-Upper Extremity Strength Normal and Lower Extremity Strength Normal.    Assessment & Plan Jacob Ruff  MD; 9/47/6546 10:23 AM) RECTAL CANCER (C20) Impression: 37 year old male who presents to the office with a 3 month history of rectal bleeding. He underwent a colonoscopy which showed a proximal rectal mass. He has had a CEA level which was normal. He underwent CT scans of the chest abdomen and pelvis. This showed proximal rectum or rectosigmoid area for the tumor.  We will go ahead with robotic-assisted resection.  Discussed in tumor conference, with all MD's agreeing to this plan. The surgery and anatomy were described to the patient as well as the risks of surgery and the possible complications. These include: Bleeding, deep abdominal infections and possible wound complications such as hernia and infection, damage to adjacent structures, leak of surgical connections, which can lead to other surgeries and possibly an ostomy, possible need for other procedures, such as abscess drains in radiology, possible prolonged hospital stay, possible diarrhea from removal of part of the colon, possible constipation from narcotics, possible bowel, bladder or sexual dysfunction if having rectal surgery, prolonged fatigue/weakness or appetite loss, possible early recurrence of of disease, possible complications of their medical problems such as heart disease or arrhythmias or lung problems, death (less than 1%). I believe the patient understands and wishes to proceed with the surgery.

## 2018-08-07 NOTE — Anesthesia Preprocedure Evaluation (Signed)
Anesthesia Evaluation  Patient identified by MRN, date of birth, ID band Patient awake    Reviewed: Allergy & Precautions, H&P , NPO status , Patient's Chart, lab work & pertinent test results  Airway Mallampati: II  TM Distance: >3 FB Neck ROM: full    Dental no notable dental hx.    Pulmonary neg pulmonary ROS,    Pulmonary exam normal breath sounds clear to auscultation       Cardiovascular Exercise Tolerance: Good hypertension, Pt. on medications negative cardio ROS   Rhythm:regular Rate:Normal     Neuro/Psych  Headaches, Anxiety negative neurological ROS  negative psych ROS   GI/Hepatic negative GI ROS, Neg liver ROS, GERD  ,  Endo/Other  negative endocrine ROS  Renal/GU negative Renal ROS  negative genitourinary   Musculoskeletal   Abdominal   Peds  Hematology negative hematology ROS (+)   Anesthesia Other Findings Colon cancer  Reproductive/Obstetrics negative OB ROS                             Anesthesia Physical  Anesthesia Plan  ASA: III  Anesthesia Plan: General   Post-op Pain Management:    Induction: Intravenous  PONV Risk Score and Plan: 2 and Ondansetron and Midazolam  Airway Management Planned: Oral ETT  Additional Equipment:   Intra-op Plan:   Post-operative Plan: Extubation in OR  Informed Consent: I have reviewed the patients History and Physical, chart, labs and discussed the procedure including the risks, benefits and alternatives for the proposed anesthesia with the patient or authorized representative who has indicated his/her understanding and acceptance.   Dental advisory given  Plan Discussed with: CRNA  Anesthesia Plan Comments:         Anesthesia Quick Evaluation

## 2018-08-07 NOTE — Op Note (Signed)
08/07/2018  3:14 PM  PATIENT:  Jacob Hayes  37 y.o. male  Patient Care Team: Chesley Noon, MD as PCP - General (Family Medicine) [name], MD (Cardiology) [name], MD as Attending Physician (Gastroenterology)  PRE-OPERATIVE DIAGNOSIS:  RECTOSIGMOID CANCER  POST-OPERATIVE DIAGNOSIS:  RECTOSIGMOID CANCER  PROCEDURE:  XI ROBOTIC LOW ANTERIOR RESECTION   Surgeon(s): [name], MD Kinsinger, Arta Bruce, MD  ASSISTANT: Dr Kieth Brightly   ANESTHESIA:   local and general  EBL:2m  Total I/O In: 1000 [I.V.:1000] Out: 80 [Urine:30; Blood:50]  Delay start of Pharmacological VTE agent (>24hrs) due to surgical blood loss or risk of bleeding:  no  DRAINS: none   SPECIMEN:  Source of Specimen:  Rectosigmoid   DISPOSITION OF SPECIMEN:  PATHOLOGY  COUNTS:  YES  PLAN OF CARE: Admit to inpatient   PATIENT DISPOSITION:  PACU - hemodynamically stable.  INDICATION:    37y.o. M with rectosigmoid colon cancer.   I recommended segmental resection:  The anatomy & physiology of the digestive tract was discussed.  The pathophysiology was discussed.  Natural history risks without surgery was discussed.   I worked to give an overview of the disease and the frequent need to have multispecialty involvement.  I feel the risks of no intervention will lead to serious problems that outweigh the operative risks; therefore, I recommended a partial colectomy to remove the pathology.  Laparoscopic & open techniques were discussed.   Risks such as bleeding, infection, abscess, leak, reoperation, possible ostomy, hernia, heart attack, death, and other risks were discussed.  I noted a good likelihood this will help address the problem.   Goals of post-operative recovery were discussed as well.    The patient expressed understanding & wished to proceed with surgery.  OR FINDINGS:   Patient had tumor located just above the peritoneal reflection.    No obvious metastatic disease  on visceral parietal peritoneum or liver.  The anastomosis rests 11 cm from the anal verge by rigid proctoscopy.  DESCRIPTION:   Informed consent was confirmed.  The patient underwent general anaesthesia without difficulty.  The patient was positioned appropriately.  VTE prevention in place.  The patient's abdomen was clipped, prepped, & draped in a sterile fashion.  Surgical timeout confirmed our plan.  The patient was positioned in reverse Trendelenburg.  Abdominal entry was gained using a Varies needle in the LUQ.  Entry was clean.  I induced carbon dioxide insufflation.  An 831mrobotic port was placed in the RUQ.  Camera inspection revealed no injury.  Extra ports were carefully placed under direct laparoscopic visualization.  I laparoscopically reflected the greater omentum and the upper abdomen the small bowel in the upper abdomen. The patient was appropriately positioned and the robot was docked to the patient's left side.  Instruments were placed under direct visualization.    I mobilized the sigmoid colon off of the pelvic sidewall.  I scored the base of peritoneum of the right side of the mesentery of the left colon from the ligament of Treitz to the peritoneal reflection of the mid rectum.  The patient had tumor located just above the peritoneal reflection.  I elevated the sigmoid mesentery and enetered into the retro-mesenteric plane. We were able to identify the left ureter and gonadal vessels. We kept those posterior within the retroperitoneum and elevated the left colon mesentery off that. I did isolated IMA pedicle but did not ligate it yet.  I continued distally and got into the  avascular plane posterior to the mesorectum. This allowed me to help mobilize the rectum as well by freeing the mesorectum off the sacrum.  I mobilized the peritoneal coverings around the peritoneal reflection on both the right and left sides of the rectum and met in the middle.  I could see the right and left  ureters and stayed away from them.    I skeletonized the inferior mesenteric artery pedicle.  I went down close to its takeoff from the aorta.   After confirming the left ureter was out of the way, I went ahead and ligated the inferior mesenteric artery pedicle with bipolar robotic vessel sealer ~2cm above its takeoff from the aorta.  We ensured hemostasis. I skeletonized the mesorectum ~2cm below the tumor location using blunt dissection & bipolar robotic vessel sealer.  The colon easily mobilized into the pelvis.    The mesentery was divided with the robotic vessel sealer to assist with mobilization out of the extraction site.  Once confirming the colon could easily mobilized to the pelvis, the robot was undocked and the 12 mm suprapubic port was expanded to a Pfannenstiel incision.  An Hershey wound protector was placed.  The colon was brought out and transected at the previously dissected site.  There was good bleeding noted at the mucosal edge.  A pursestring device was used to place a 2-0 Prolene pursestring.  This was secured with 3-0 silk sutures.  A 31 mm Covidien EEA anvil was then placed into the colon and the pursestring was tied tightly around this.  Additional fat around the colon was dissected free.  This was then placed back into the pelvis and the cap was placed on the wound protector.  The abdomen was reinsufflated and an anastomosis was created with the EEA stapler through the distal rectal stump.  There was no tension on the anastomosis.  There was no leak when insufflated under water.  The anastomosis rests approximately 11 cm from the anal verge.  Staple line was hemostatic and visualized with the rigid sigmoidoscope.  The abdomen was then irrigated with a liter of warm normal saline.  Hemostasis was good.  The ports were removed and the abdomen was desufflated.  Then switched to clean gowns, gloves, instruments and drapes.  The Pfannenstiel incision was closed with a 2-0 running Vicryl  suture for the peritoneal layer.  Interrupted #1 Novafil sutures were used to close the fascia and a running 2-0 Vicryl suture was used to close the subcutaneous tissue.  The skin was closed with a 4-0 Vicryl subcuticular suture and covered with a sterile dressing.  The remaining port sites were all closed using 4-0 Vicryl interrupted sutures and Dermabond.  The patient was then awakened from anesthesia and sent to the postanesthesia care unit stable condition.  All counts were correct per operating room staff.  An MD assistant was necessary for tissue manipulation, retraction and positioning due to the complexity of the case and hospital policies

## 2018-08-08 LAB — BASIC METABOLIC PANEL
Anion gap: 9 (ref 5–15)
BUN: 8 mg/dL (ref 6–20)
CHLORIDE: 106 mmol/L (ref 98–111)
CO2: 25 mmol/L (ref 22–32)
CREATININE: 1.12 mg/dL (ref 0.61–1.24)
Calcium: 8.6 mg/dL — ABNORMAL LOW (ref 8.9–10.3)
GFR calc Af Amer: >60 mL/min (ref >60)
GFR calc non Af Amer: >60 mL/min (ref >60)
GLUCOSE: 118 mg/dL — AB (ref 70–99)
POTASSIUM: 4 mmol/L (ref 3.5–5.1)
Sodium: 140 mmol/L (ref 135–145)

## 2018-08-08 LAB — CBC
HCT: 38.9 % — ABNORMAL LOW (ref 39.0–52.0)
HEMOGLOBIN: 13.2 g/dL (ref 13.0–17.0)
MCH: 31.1 pg (ref 26.0–34.0)
MCHC: 33.9 g/dL (ref 30.0–36.0)
MCV: 91.5 fL (ref 78.0–100.0)
Platelets: 238 10*3/uL (ref 150–400)
RBC: 4.25 MIL/uL (ref 4.22–5.81)
RDW: 12.5 % (ref 11.5–15.5)
WBC: 7.9 10*3/uL (ref 4.0–10.5)

## 2018-08-08 MED ORDER — KETOROLAC TROMETHAMINE 15 MG/ML IJ SOLN
15.0000 mg | Freq: Four times a day (QID) | INTRAMUSCULAR | Status: DC
  Administered 2018-08-08 – 2018-08-09 (×4): 15 mg via INTRAVENOUS
  Filled 2018-08-08 (×4): qty 1

## 2018-08-08 MED ORDER — GABAPENTIN 300 MG PO CAPS
300.0000 mg | ORAL_CAPSULE | Freq: Three times a day (TID) | ORAL | Status: DC
  Administered 2018-08-08 (×2): 300 mg via ORAL
  Filled 2018-08-08 (×4): qty 1

## 2018-08-08 NOTE — Progress Notes (Signed)
1 Day Post-Op Robotic LAR Subjective: Has minor complaints about incisional pain and foley.  Passing gas and had a BM.  Foley out.  Good uop  Objective: Vital signs in last 24 hours: Temp:  [97.6 F (36.4 C)-99 F (37.2 C)] 98.3 F (36.8 C) (09/18 0527) Pulse Rate:  [66-97] 66 (09/18 0527) Resp:  [12-25] 15 (09/18 0527) BP: (125-156)/(71-97) 125/71 (09/18 0527) SpO2:  [96 %-100 %] 99 % (09/18 0527) Weight:  [97.4 kg-99.3 kg] 97.4 kg (09/18 0527)   Intake/Output from previous day: 09/17 0701 - 09/18 0700 In: 2761.7 [P.O.:480; I.V.:2181.7; IV Piggyback:100] Out: 1530 [Urine:1480; Blood:50] Intake/Output this shift: No intake/output data recorded.   General appearance: alert and cooperative GI: normal findings: soft, nondistended  Incision: no significant drainage  Lab Results:  Recent Labs    08/08/18 0436  WBC 7.9  HGB 13.2  HCT 38.9*  PLT 238   BMET Recent Labs    08/08/18 0436  NA 140  K 4.0  CL 106  CO2 25  GLUCOSE 118*  BUN 8  CREATININE 1.12  CALCIUM 8.6*   PT/INR No results for input(s): LABPROT, INR in the last 72 hours. ABG No results for input(s): PHART, HCO3 in the last 72 hours.  Invalid input(s): PCO2, PO2  MEDS, Scheduled . alvimopan  12 mg Oral BID  . diazepam  2.5 mg Oral QHS  . enoxaparin (LOVENOX) injection  40 mg Subcutaneous Q24H  . feeding supplement  237 mL Oral BID BM  . gabapentin  300 mg Oral TID  . hydrochlorothiazide  12.5 mg Oral Daily  . ketorolac  15 mg Intravenous Q6H  . loratadine  10 mg Oral Daily  . losartan  50 mg Oral BID  . methimazole  5 mg Oral Daily  . pantoprazole  40 mg Oral Daily  . saccharomyces boulardii  250 mg Oral BID    Studies/Results: No results found.  Assessment: s/p Procedure(s): XI ROBOTIC LOW ANTERIOR RESECTION Patient Active Problem List   Diagnosis Date Noted  . Rectosigmoid cancer ([place]) 08/07/2018  . 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 Rectal  bleeding 06/14/2018  . Change in bowel function 06/14/2018  . Abnormal TSH 03/07/2013  . Abdominal pain, epigastric 03/06/2013  . Nausea alone 03/06/2013  . Bloating 03/06/2013  . Dizziness 08/02/2012  . Heart murmur 08/02/2012  . HYPERLIPIDEMIA 07/07/2010  . Essential hypertension, benign 07/07/2010  . GERD 07/07/2010    Expected post op course  Plan: d/c foley Advance diet to reg as tolerated Amulate TID Toradol, tylenol and gabapentin for incisional pain   LOS: 1 day     .Rosario Adie, [facility], [place]   08/08/2018 8:52 AM

## 2018-08-08 NOTE — Progress Notes (Signed)
ERAS education reinforced. Did patient attend class prior to procedure? Yes [ x  ] No [   ] Discussed: Pain Control [ x  ] Mobility [ x  ] Diet [ x  ] Other [   ]  Patient just recently back to bed to rest. Reports ambulating in hallways and sitting in chair most of day. He reports incisional pain was 10/10 this morning but better now.   Thinks he is going home tomorrow. Doing very well and appreciates the ERAS program.  Pecolia Ades, RN, BSN Quality Program Coordinator, [facility] after Surgery 08/08/18 3:28 PM

## 2018-08-09 ENCOUNTER — Encounter (HOSPITAL_COMMUNITY): Payer: Self-pay

## 2018-08-09 LAB — CBC
HCT: 37.1 % — ABNORMAL LOW (ref 39.0–52.0)
Hemoglobin: 12.6 g/dL — ABNORMAL LOW (ref 13.0–17.0)
MCH: 31.2 pg (ref 26.0–34.0)
MCHC: 34 g/dL (ref 30.0–36.0)
MCV: 91.8 fL (ref 78.0–100.0)
Platelets: 227 10*3/uL (ref 150–400)
RBC: 4.04 MIL/uL — ABNORMAL LOW (ref 4.22–5.81)
RDW: 12.8 % (ref 11.5–15.5)
WBC: 6.4 10*3/uL (ref 4.0–10.5)

## 2018-08-09 LAB — BASIC METABOLIC PANEL
Anion gap: 7 (ref 5–15)
BUN: 11 mg/dL (ref 6–20)
CHLORIDE: 107 mmol/L (ref 98–111)
CO2: 27 mmol/L (ref 22–32)
Calcium: 8.9 mg/dL (ref 8.9–10.3)
Creatinine, Ser: 0.99 mg/dL (ref 0.61–1.24)
GFR calc Af Amer: >60 mL/min (ref >60)
GFR calc non Af Amer: >60 mL/min (ref >60)
GLUCOSE: 98 mg/dL (ref 70–99)
POTASSIUM: 3.8 mmol/L (ref 3.5–5.1)
SODIUM: 141 mmol/L (ref 135–145)

## 2018-08-09 MED ORDER — ACETAMINOPHEN 500 MG PO TABS
1000.0000 mg | ORAL_TABLET | Freq: Four times a day (QID) | ORAL | Status: DC
  Administered 2018-08-09 – 2018-08-10 (×3): 1000 mg via ORAL
  Filled 2018-08-09 (×3): qty 2

## 2018-08-09 MED ORDER — IBUPROFEN 200 MG PO TABS
400.0000 mg | ORAL_TABLET | Freq: Four times a day (QID) | ORAL | Status: DC | PRN
  Administered 2018-08-09: 800 mg via ORAL
  Administered 2018-08-09: 400 mg via ORAL
  Administered 2018-08-10: 800 mg via ORAL
  Filled 2018-08-09 (×2): qty 4

## 2018-08-09 MED FILL — Lidocaine IV Infusion in D5W Inj 4 MG/ML: INTRAVENOUS | Qty: 500 | Status: AC

## 2018-08-09 NOTE — Progress Notes (Signed)
2 Days Post-Op Robotic LAR Subjective: Pain better with toradol.  Passing gas and having BM's.  Urinating well  Objective: Vital signs in last 24 hours: Temp:  [97.9 F (36.6 C)-98.6 F (37 C)] 98.2 F (36.8 C) (09/19 0628) Pulse Rate:  [65-76] 69 (09/19 0628) Resp:  [18] 18 (09/19 0628) BP: (117-132)/(58-69) 117/65 (09/19 0628) SpO2:  [95 %-99 %] 96 % (09/19 0628) Weight:  [97.3 kg] 97.3 kg (09/19 0631)   Intake/Output from previous day: 09/18 0701 - 09/19 0700 In: 1560 [P.O.:1560] Out: 2525 [Urine:2525] Intake/Output this shift: No intake/output data recorded.   General appearance: alert and cooperative GI: normal findings: soft, nondistended  Incision: no significant drainage  Lab Results:  Recent Labs    08/08/18 0436 08/09/18 0355  WBC 7.9 6.4  HGB 13.2 12.6*  HCT 38.9* 37.1*  PLT 238 227   BMET Recent Labs    08/08/18 0436 08/09/18 0355  NA 140 141  K 4.0 3.8  CL 106 107  CO2 25 27  GLUCOSE 118* 98  BUN 8 11  CREATININE 1.12 0.99  CALCIUM 8.6* 8.9   PT/INR No results for input(s): LABPROT, INR in the last 72 hours. ABG No results for input(s): PHART, HCO3 in the last 72 hours.  Invalid input(s): PCO2, PO2  MEDS, Scheduled . acetaminophen  1,000 mg Oral Q6H  . alvimopan  12 mg Oral BID  . diazepam  2.5 mg Oral QHS  . enoxaparin (LOVENOX) injection  40 mg Subcutaneous Q24H  . feeding supplement  237 mL Oral BID BM  . gabapentin  300 mg Oral TID  . hydrochlorothiazide  12.5 mg Oral Daily  . loratadine  10 mg Oral Daily  . losartan  50 mg Oral BID  . methimazole  5 mg Oral Daily  . pantoprazole  40 mg Oral Daily  . saccharomyces boulardii  250 mg Oral BID    Studies/Results: No results found.  Assessment: s/p Procedure(s): XI ROBOTIC LOW ANTERIOR RESECTION Patient Active Problem List   Diagnosis Date Noted  . Rectosigmoid cancer ([place]) 08/07/2018  . 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 Rectal  bleeding 06/14/2018  . Change in bowel function 06/14/2018  . Abnormal TSH 03/07/2013  . Abdominal pain, epigastric 03/06/2013  . Nausea alone 03/06/2013  . Bloating 03/06/2013  . Dizziness 08/02/2012  . Heart murmur 08/02/2012  . HYPERLIPIDEMIA 07/07/2010  . Essential hypertension, benign 07/07/2010  . GERD 07/07/2010    Expected post op course  Plan: Plan for d/c tom as long as hgb stable reg as tolerated Amulate TID Ibuprofen, tylenol and gabapentin for incisional pain   LOS: 2 days     .Rosario Adie, [facility], [place]   08/09/2018 7:08 AM

## 2018-08-09 NOTE — Care Management Note (Signed)
Case Management Note  Patient Details  Name: Jacob Hayes MRN: [id number] Date of Birth: October 23, 1981  Subjective/Objective: From home. Rectosigmoid Ca.S/p LAR. No CM needs.                   Action/Plan:d/c home.   Expected Discharge Date:                  Expected Discharge Plan:  Home/Self Care  In-House Referral:     Discharge planning Services     Post Acute Care Choice:    Choice offered to:     DME Arranged:    DME Agency:     HH Arranged:    HH Agency:     Status of Service:  In process, will continue to follow  If discussed at Long Length of Stay Meetings, dates discussed:    Additional Comments:  Dessa Phi, RN 08/09/2018, 10:43 AM

## 2018-08-10 LAB — CBC
HCT: 37.7 % — ABNORMAL LOW (ref 39.0–52.0)
Hemoglobin: 12.7 g/dL — ABNORMAL LOW (ref 13.0–17.0)
MCH: 30.9 pg (ref 26.0–34.0)
MCHC: 33.7 g/dL (ref 30.0–36.0)
MCV: 91.7 fL (ref 78.0–100.0)
PLATELETS: 251 10*3/uL (ref 150–400)
RBC: 4.11 MIL/uL — ABNORMAL LOW (ref 4.22–5.81)
RDW: 12.6 % (ref 11.5–15.5)
WBC: 5 10*3/uL (ref 4.0–10.5)

## 2018-08-10 LAB — BASIC METABOLIC PANEL
Anion gap: 6 (ref 5–15)
BUN: 14 mg/dL (ref 6–20)
CO2: 28 mmol/L (ref 22–32)
CREATININE: 1.01 mg/dL (ref 0.61–1.24)
Calcium: 9.2 mg/dL (ref 8.9–10.3)
Chloride: 109 mmol/L (ref 98–111)
GFR calc Af Amer: >60 mL/min (ref >60)
GFR calc non Af Amer: >60 mL/min (ref >60)
GLUCOSE: 104 mg/dL — AB (ref 70–99)
Potassium: 4.4 mmol/L (ref 3.5–5.1)
SODIUM: 143 mmol/L (ref 135–145)

## 2018-08-10 MED ORDER — IBUPROFEN 400 MG PO TABS: 400.0000 mg | ORAL_TABLET | Freq: Four times a day (QID) | ORAL | 0 refills | Status: DC | PRN

## 2018-08-10 NOTE — Discharge Summary (Signed)
Physician Discharge Summary  Patient ID: Jacob Hayes MRN: [id number] DOB/AGE: Apr 15, 1981 37 y.o.  Admit date: 08/07/2018 Discharge date: 08/10/2018  Admission Diagnoses: Rectosigmoid cancer  Discharge Diagnoses:  Active Problems:   Rectosigmoid cancer [facility])   Discharged Condition: good  Hospital Course: 37 y.o. M with rectosigmoid cancer.  He underwent uneventful LAR.  By POD 3 he was tolerating a diet and having bowel function.  His pain was controlled with PO meds and he was urinating and ambulating well.  Consults: None  Significant Diagnostic Studies: labs: cbc, bmet  Treatments: IV hydration, analgesia: acetaminophen and surgery: LAR  Discharge Exam: Blood pressure 123/67, pulse 63, temperature 97.7 F (36.5 C), temperature source Oral, resp. rate 18, height 6\' 3"  (1.905 m), weight 96.9 kg, SpO2 100 %. General appearance: alert and cooperative GI: normal findings: soft, non-tender Incision/Wound: clean, dry, intact  Disposition: home   Allergies as of 08/10/2018   No Known Allergies     Medication List    TAKE these medications   acetaminophen 500 MG tablet Commonly known as:  TYLENOL Take 500 mg by mouth every 6 (six) hours as needed for moderate pain or headache.   b complex vitamins capsule Take 1 capsule by mouth daily.   cetirizine 10 MG tablet Commonly known as:  ZYRTEC Take 10 mg by mouth daily as needed for allergies.   diazepam 5 MG tablet Commonly known as:  VALIUM Take 2.5 mg by mouth at bedtime.   diphenhydrAMINE 25 mg capsule Commonly known as:  BENADRYL Take 25 mg by mouth daily as needed for allergies.   fluticasone 50 MCG/ACT nasal spray Commonly known as:  FLONASE Place 2 sprays into the nose daily. What changed:    when to take this  reasons to take this   hydrochlorothiazide 12.5 MG capsule Commonly known as:  MICROZIDE Take 12.5 mg by mouth daily.   ibuprofen 400 MG tablet Commonly known as:  ADVIL,MOTRIN Take 1-2  tablets (400-800 mg total) by mouth every 6 (six) hours as needed for fever, headache, mild pain or moderate pain.   losartan 100 MG tablet Commonly known as:  COZAAR Take 100 mg by mouth daily.   Magnesium 250 MG Tabs Take 250 mg by mouth 2 (two) times daily.   methimazole 5 MG tablet Commonly known as:  TAPAZOLE Take 5 mg by mouth daily.   multivitamin with minerals Tabs tablet Take 1 tablet by mouth daily. Men's One-A-Day   pantoprazole 40 MG tablet Commonly known as:  PROTONIX TAKE ONE TABLET BY MOUTH TWICE DAILY BEFORE A MEAL What changed:  See the new instructions.   simvastatin 40 MG tablet Commonly known as:  ZOCOR Take 40 mg by mouth every evening.   vitamin C 1000 MG tablet Take 1,000 mg by mouth daily.      Follow-up Information    [name], MD. Go on 20/07/4708.   Specialty:  General Surgery Contact information: [address] [place] [address] [phone]           Signed: Rosario Adie 0/35/4656, 81:27 AM

## 2018-08-10 NOTE — Discharge Instructions (Signed)

## 2018-08-10 NOTE — Progress Notes (Signed)
Patient discharged to home with family. Given all belongings, instructions. Both verbalized understanding of all instructions. Escorted to pov via w/c.

## 2018-08-16 ENCOUNTER — Other Ambulatory Visit: Payer: Self-pay | Admitting: Radiation Oncology

## 2018-08-16 ENCOUNTER — Ambulatory Visit: Admission: RE | Admit: 2018-08-16 | Payer: Commercial Managed Care - PPO | Source: Ambulatory Visit

## 2018-08-16 ENCOUNTER — Telehealth: Payer: Self-pay | Admitting: Radiation Oncology

## 2018-08-16 ENCOUNTER — Telehealth (HOSPITAL_COMMUNITY): Payer: Self-pay | Admitting: Pharmacist

## 2018-08-16 ENCOUNTER — Telehealth (HOSPITAL_COMMUNITY): Payer: Self-pay | Admitting: Pharmacy Technician

## 2018-08-16 ENCOUNTER — Ambulatory Visit: Payer: Self-pay | Admitting: General Surgery

## 2018-08-16 ENCOUNTER — Encounter: Payer: Self-pay | Admitting: Radiation Oncology

## 2018-08-16 ENCOUNTER — Encounter (HOSPITAL_COMMUNITY): Payer: Self-pay | Admitting: Hematology

## 2018-08-16 ENCOUNTER — Inpatient Hospital Stay (HOSPITAL_COMMUNITY): Payer: Commercial Managed Care - PPO | Attending: Hematology | Admitting: Hematology

## 2018-08-16 ENCOUNTER — Ambulatory Visit
Admission: RE | Admit: 2018-08-16 | Discharge: 2018-08-16 | Disposition: A | Discharge status: Home / Self Care | Payer: Commercial Managed Care - PPO | Source: Ambulatory Visit | Attending: Radiation Oncology | Admitting: Radiation Oncology

## 2018-08-16 VITALS — BP 123/81 | HR 63 | Temp 98.0°F | Resp 18 | Ht 75.0 in | Wt 213.2 lb

## 2018-08-16 DIAGNOSIS — C19 Malignant neoplasm of rectosigmoid junction: Secondary | ICD-10-CM | POA: Diagnosis present

## 2018-08-16 DIAGNOSIS — Z803 Family history of malignant neoplasm of breast: Secondary | ICD-10-CM

## 2018-08-16 DIAGNOSIS — Z801 Family history of malignant neoplasm of trachea, bronchus and lung: Secondary | ICD-10-CM | POA: Insufficient documentation

## 2018-08-16 MED ORDER — CAPECITABINE 150 MG PO TABS: 300.0000 mg | ORAL_TABLET | Freq: Two times a day (BID) | ORAL | 0 refills | Status: DC

## 2018-08-16 MED ORDER — CAPECITABINE 500 MG PO TABS: 1500.0000 mg | ORAL_TABLET | Freq: Two times a day (BID) | ORAL | 0 refills | Status: DC

## 2018-08-16 NOTE — Progress Notes (Signed)
Patient was provided chemotherapy information on Xeloda as well as Fluorouracil, Oxaliplatin and Leucovorin.  He was advised on the treatment plan and what he should expect the day of treatments.  He was given a tour of the infusion area.  Patient was given opportunity to ask questions and voice concerns.  I explained to patient that he would get a more extensive teaching packet prior to starting infusion chemotherapy.  Consent was signed for Xeloda.  Explained to patient that he would sign another consent when decisions are made on his treatment plan after radiation/chemotherapy.

## 2018-08-16 NOTE — Progress Notes (Signed)
Jacob Hayes, [place] [address]   CLINIC:  Medical Oncology/Hematology  PCP:  Chesley Noon, MD [place] [id number] [phone]   REASON FOR VISIT: Follow-up for adenocarcinoma of the rectum   CURRENT THERAPY: S/P surgery will start Xeloda and radiatio in 2 weeks    INTERVAL HISTORY:  Jacob Hayes 37 y.o. male returns for routine follow-up rectal cancer. Patient underwent surgery 10 days ago. He is doing well. Incisions healing. He still has pain to his abdomen with exertion. It is improving. He energy level is 25% and is still fatigued he believes mainly due to the surgery. His appetite is good at 75%. He denies any rectal bleeding. Denies any other pain. Denies any nausea, vomiting, or diarrhea.     REVIEW OF SYSTEMS:  Review of Systems  Constitutional: Positive for fatigue.  Respiratory: Positive for shortness of breath (with excertion ).   Gastrointestinal: Positive for abdominal pain (d/t surgery).  All other systems reviewed and are negative.    PAST MEDICAL/SURGICAL HISTORY:  Past Medical History:  Diagnosis Date  . Allergic rhinitis   . Anxiety   . Anxiety and depression    DENIES   . Bruxism   . Colon cancer ([place])   . Essential hypertension, benign   . GERD   . Hyperlipidemia    Since age 68  . Meniere's disease    Past Surgical History:  Procedure Laterality Date  . BIOPSY N/A 03/20/2013   Procedure: BIOPSY;  Surgeon: [name], MD;  [facility];  Service: Endoscopy;  Laterality: N/A;  Possible small bowel biopsy  . BIOPSY  07/05/2018   Procedure: BIOPSY;  Surgeon: [name], MD;  [facility];  Service: Endoscopy;;  rectal mass  . COLONOSCOPY WITH PROPOFOL N/A 07/05/2018   Procedure: COLONOSCOPY WITH PROPOFOL;  Surgeon: [name], MD;  [facility];  Service: Endoscopy;  Laterality: N/A;  8:30am  . ESOPHAGOGASTRODUODENOSCOPY N/A 03/20/2013   Dr.  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  One slightly nodular.  Small hiatal hernia.  Multiple fundic gland appearing polyps. no evidence of celiac disease, malignancy, h.pylori, barrett's.   . None    . POLYPECTOMY  07/05/2018   Procedure: POLYPECTOMY;  Surgeon: [name], MD;  [facility];  Service: Endoscopy;;  hepatic flexurex2;     SOCIAL HISTORY:  Social History   Socioeconomic History  . Marital status: Divorced    Spouse name: Not on file  . Number of children: 2  . Years of education: Not on file  . Highest education level: Not on file  Occupational History  . Occupation: works with Sales executive at [employer]  . Financial resource strain: Not on file  . Food insecurity:    Worry: Not on file    Inability: Not on file  . Transportation needs:    Medical: Not on file    Non-medical: Not on file  Tobacco Use  . Smoking status: Never Smoker  . Smokeless tobacco: Never Used  Substance and Sexual Activity  . Alcohol use: No  . Drug use: No  . Sexual activity: Yes  Lifestyle  . Physical activity:    Days per week: Not on file    Minutes per session: Not on file  . Stress: Not on file  Relationships  .  Social connections:    Talks on phone: Not on file    Gets together: Not on file    Attends religious service: Not on file    Active member of club or organization: Not on file    Attends meetings of clubs or organizations: Not on file    Relationship status: Not on file  . Intimate partner violence:    Fear of current or ex partner: Not on file    Emotionally abused: Not on file    Physically abused: Not on file    Forced sexual activity: Not on file  Other Topics Concern  . Not on file  Social History Narrative   Unable to ask intimate partner questions.    FAMILY HISTORY:  Family History  Problem Relation Age of Onset  .  Breast cancer Mother   . Thyroid disease Mother        Thyroid removed  . Lung cancer Mother   . Crohn's disease Mother        ???  . Heart disease Father   . Stroke Father   . Hyperlipidemia Father   . AAA (abdominal aortic aneurysm) Father   . Valvular heart disease Brother        Bicuspid aortic valve  . Lung cancer Maternal Aunt   . Colon cancer Neg Hx     CURRENT MEDICATIONS:  Outpatient Encounter Medications as of 08/16/2018  Medication Sig  . acetaminophen (TYLENOL) 500 MG tablet Take 500 mg by mouth every 6 (six) hours as needed for moderate pain or headache.   . Ascorbic Acid (VITAMIN C) 1000 MG tablet Take 1,000 mg by mouth daily.  Marland Kitchen b complex vitamins capsule Take 1 capsule by mouth daily.  . cetirizine (ZYRTEC) 10 MG tablet Take 10 mg by mouth daily as needed for allergies.   . diazepam (VALIUM) 5 MG tablet Take 2.5 mg by mouth at bedtime.   . diphenhydrAMINE (BENADRYL) 25 mg capsule Take 25 mg by mouth daily as needed for allergies.   . fluticasone (FLONASE) 50 MCG/ACT nasal spray Place 2 sprays into the nose daily. (Patient taking differently: Place 2 sprays into the nose daily as needed (for seasonal allergies.). )  . hydrochlorothiazide (MICROZIDE) 12.5 MG capsule Take 12.5 mg by mouth daily.  Marland Kitchen ibuprofen (ADVIL,MOTRIN) 400 MG tablet Take 1-2 tablets (400-800 mg total) by mouth every 6 (six) hours as needed for fever, headache, mild pain or moderate pain.  Marland Kitchen losartan (COZAAR) 100 MG tablet Take 100 mg by mouth daily.   . Magnesium 250 MG TABS Take 250 mg by mouth 2 (two) times daily.   . methimazole (TAPAZOLE) 5 MG tablet Take 5 mg by mouth daily.   . Multiple Vitamin (MULTIVITAMIN WITH MINERALS) TABS tablet Take 1 tablet by mouth daily. Men's One-A-Day  . pantoprazole (PROTONIX) 40 MG tablet TAKE ONE TABLET BY MOUTH TWICE DAILY BEFORE A MEAL (Patient taking differently: Take 40 mg by mouth daily. )  . simvastatin (ZOCOR) 40 MG tablet Take 40 mg by mouth every evening.    . capecitabine (XELODA) 150 MG tablet Take 2 tablets (300 mg total) by mouth 2 (two) times daily after a meal. Take along with the 500 mg tablets.  . capecitabine (XELODA) 500 MG tablet Take 3 tablets (1,500 mg total) by mouth 2 (two) times daily after a meal.   No facility-administered encounter medications on file as of 08/16/2018.     ALLERGIES:  No Known Allergies   PHYSICAL EXAM:  ECOG Performance status: 1  Vitals:   08/16/18 0829  BP: 121/76  Pulse: 82  Resp: 14  Temp: 97.8 F (36.6 C)  SpO2: 100%   Filed Weights   08/16/18 0829  Weight: 214 lb 4.8 oz (97.2 kg)    Physical Exam  Constitutional: He is oriented to person, place, and time. He appears well-developed and well-nourished.  Abdominal: Soft. There is tenderness.  Musculoskeletal: Normal range of motion.  Neurological: He is alert and oriented to person, place, and time.  Skin: Skin is warm and dry.  3 small incision on abdomen healing no redness  Psychiatric: He has a normal mood and affect. His behavior is normal. Judgment and thought content normal.     LABORATORY DATA:  I have reviewed the labs as listed.  CBC    Component Value Date/Time   WBC 5.0 08/10/2018 0453   RBC 4.11 (L) 08/10/2018 0453   HGB 12.7 (L) 08/10/2018 0453   HCT 37.7 (L) 08/10/2018 0453   PLT 251 08/10/2018 0453   MCV 91.7 08/10/2018 0453   MCH 30.9 08/10/2018 0453   MCHC 33.7 08/10/2018 0453   RDW 12.6 08/10/2018 0453   LYMPHSABS 0.7 06/29/2012 1539   MONOABS 0.4 06/29/2012 1539   EOSABS 0.0 06/29/2012 1539   BASOSABS 0.0 06/29/2012 1539   CMP Latest Ref Rng & Units 08/10/2018 08/09/2018 08/08/2018  Glucose 70 - 99 mg/dL 104(H) 98 118(H)  BUN 6 - 20 mg/dL _0 Creatinine 0.61 - 1.24 mg/dL 1.01 0.99 1.12  Sodium 135 - 145 mmol/L 143 141 140  Potassium 3.5 - 5.1 mmol/L 4.4 3.8 4.0  Chloride 98 - 111 mmol/L 109 107 106  CO2 22 - 32 mmol/L _1 Calcium 8.9 - 10.3 mg/dL 9.2 8.9 8.6(L)  Total Protein 6.5 - 8.1  g/dL - - -  Total Bilirubin 0.3 - 1.2 mg/dL - - -  Alkaline Phos 38 - 126 U/L - - -  AST 15 - 41 U/L - - -  ALT 0 - 44 U/L - - -          ASSESSMENT & PLAN:   Primary adenocarcinoma of rectosigmoid junction (HCC) 1.  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 - Colonoscopy on 07/05/2018 shows a rectal mass, biopsy consistent with invasive moderately differentiated adenocarcinoma. - CT CAP on 07/10/2018 showing mass within the rectosigmoid colon, no evidence of nodal or distant metastatic disease. - MRI of the pelvis rectal protocol on 07/16/2018 shows a 4.2 cm polypoid lesion along the right lateral aspect of the upper rectum, 14 cm from anal sphincter, T stage: T1 or T2, N0. - Robotic low anterior resection on 08/07/2018 - Pathology showing invasive moderately differentiated colorectal adenocarcinoma, 3.5 cm, carcinoma focally involving pericolonic connective tissue, 1/15 lymph nodes positive, margins negative - I had a prolonged discussion with the patient and his girlfriend about further management of this colorectal stage III cancer.  I have recommended chemoradiation therapy with Xeloda (825 mg/m twice daily Monday through Friday throughout the course of radiation).  This will be followed by 4 months of adjuvant chemotherapy with FOLFOX/XELOX regimen.  We discussed the side effects and the regimen in detail.  I also discussed the surveillance plan consisting of history and physical exam every 3 to 6 months for 2 years followed by every 6 months for total of 5 years.  We will  do CT scans of the chest, abdomen and pelvis every 6 to 12 months for total of 5 years.  Colonoscopy will be recommended one year after surgery.  He has a follow-up appointment with Dr. Lisbeth Renshaw later this afternoon.  We will send a prescription to the specialty pharmacy.  2.  Family history: - Mother had  breast cancer.  She later developed lung cancer and was a smoker.  Maternal aunt also had lung cancer. -Based on his young age her diagnosis, I would recommend genetic testing.   Total time spent is 40 minutes with more than 50% of the time spent face-to-face discussing pathology report, treatment options, side effects, coordination of care.    Orders placed this encounter:  No orders of the defined types were placed in this encounter.     Derek Jack, MD [place] [phone]

## 2018-08-16 NOTE — Assessment & Plan Note (Addendum)
1.  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 - Colonoscopy on 07/05/2018 shows a rectal mass, biopsy consistent with invasive moderately differentiated adenocarcinoma. - CT CAP on 07/10/2018 showing mass within the rectosigmoid colon, no evidence of nodal or distant metastatic disease. - MRI of the pelvis rectal protocol on 07/16/2018 shows a 4.2 cm polypoid lesion along the right lateral aspect of the upper rectum, 14 cm from anal sphincter, T stage: T1 or T2, N0. - Robotic low anterior resection on 08/07/2018 - Pathology showing invasive moderately differentiated colorectal adenocarcinoma, 3.5 cm, carcinoma focally involving pericolonic connective tissue, 1/15 lymph nodes positive, margins negative - I had a prolonged discussion with the patient and his girlfriend about further management of this colorectal stage III cancer.  I have recommended chemoradiation therapy with Xeloda (825 mg/m twice daily Monday through Friday throughout the course of radiation).  This will be followed by 4 months of adjuvant chemotherapy with FOLFOX/XELOX regimen.  We discussed the side effects and the regimen in detail.  I also discussed the surveillance plan consisting of history and physical exam every 3 to 6 months for 2 years followed by every 6 months for total of 5 years.  We will do CT scans of the chest, abdomen and pelvis every 6 to 12 months for total of 5 years.  Colonoscopy will be recommended one year after surgery.  He has a follow-up appointment with Dr. Lisbeth Renshaw later this afternoon.  We will send a prescription to the specialty pharmacy.  2.  Family history: - Mother had breast cancer.  She later developed lung cancer and was a smoker.  Maternal aunt also had lung cancer. -Based on his young age her diagnosis, I would recommend genetic testing.

## 2018-08-16 NOTE — Telephone Encounter (Signed)
Oral Oncology Pharmacist Encounter  Received new prescription for Xeloda (capecitabine) for the treatment of Stage III colorectal adenocarcinoma in conjunction with oxaliplatin, planned duration until disease progression or unacceptable drug toxicity.  BMP from 08/10/18 assessed, no relevant lab abnormalities. Prescription dose and frequency assessed.   Current medication list in Epic reviewed, one DDIs with capecitabine identified: - Pantoprazole:Proton Pump Inhibitors (PPI) may diminish the therapeutic effect of capecitabine. Recommend evaluating theneed for a PPI/acid suppression. If acid suppression is needed, recommend switching to a H2 antagonist (eg, ranitidine) to avoid this DDI.   Oral Oncology Clinic will continue to follow for copayment issues, initial counseling and start date.  Darl Pikes, PharmD, BCPS, [facility] Hematology/Oncology Clinical Pharmacist [facility] [phone]  08/16/2018 3:19 PM

## 2018-08-16 NOTE — Patient Instructions (Signed)
[facility] at [facility] Discharge Instructions  You saw Dr. Katragadda today.   Thank you for choosing [facility] to provide your oncology and hematology care.  To afford each patient quality time with our provider, please arrive at least 15 minutes before your scheduled appointment time.   If you have a lab appointment with the Cancer Center please come in thru the  Main Entrance and check in at the main information desk  You need to re-schedule your appointment should you arrive 10 or more minutes late.  We strive to give you quality time with our providers, and arriving late affects you and other patients whose appointments are after yours.  Also, if you no show three or more times for appointments you may be dismissed from the clinic at the providers discretion.     Again, thank you for choosing [facility].  Our hope is that these requests will decrease the amount of time that you wait before being seen by our physicians.       _____________________________________________________________  Should you have questions after your visit to [facility], please contact our office at [phone] between the hours of 8:00 a.m. and 4:30 p.m.  Voicemails left after 4:00 p.m. will not be returned until the following business day.  For prescription refill requests, have your pharmacy contact our office and allow 72 hours.    Cancer Center Support Programs:   > Cancer Support Group  2nd Tuesday of the month 1pm-2pm, Journey Room    

## 2018-08-16 NOTE — Telephone Encounter (Signed)
I called the patient and he would like to come in to discuss his course and options of treatment today at 11:00 today. He will see Dr. Lamonte Richer as well this morning.

## 2018-08-16 NOTE — Telephone Encounter (Signed)
Oral Oncology Patient Advocate Encounter  Received notification from [facility] that prior authorization for Xeloda is required.  PA submitted on CoverMyMeds Key [id number] Status is pending  Oral Oncology Clinic will continue to follow.  [place] Patient [place] Phone [phone] Fax [phone] 08/16/2018 4:01 PM

## 2018-08-17 ENCOUNTER — Ambulatory Visit: Payer: Self-pay | Admitting: General Surgery

## 2018-08-17 NOTE — Telephone Encounter (Signed)
Oral Oncology Patient Advocate Encounter  Prior Authorization for Xeloda has been approved.    PA# [id number] Effective dates: 08/16/18 through 08/17/2019  Per insurance Xeloda must be filled through [facility].  [facility] and follow up until medication has been delivered.  Oral Oncology Clinic will continue to follow.   [place] Patient [place] Phone [phone] Fax [phone] 08/17/2018 8:24 AM

## 2018-08-17 NOTE — Progress Notes (Signed)
Radiation Oncology         ([phone]) [phone] ________________________________  Name: Jacob Hayes        MRN: [id number]  Date of Service: 08/16/2018 DOB: 1980-12-04  CC:Chesley Noon, MD  Derek Jack, MD     REFERRING PHYSICIAN: Derek Jack, MD   DIAGNOSIS: The primary encounter diagnosis was Primary adenocarcinoma of rectosigmoid junction [facility] A diagnosis of Rectosigmoid cancer [facility]) was also pertinent to this visit.   HISTORY OF PRESENT ILLNESS: Jacob Hayes is a 37 y.o. male originally seen at the request of Dr. Delton Coombes for a new diagnosis of high rectal/low colon cancer. The patient noted rectal bleeding since February 2019 and had a colonoscopy on 07/05/18 that revealed a ulcerated lesion 15 cm from the anal verge and this was 4 x 4 cm in dimension. This was biopsied and consistent with adenocarcinoma. A CT C/A/P on 07/10/18 revealed thickening of the right aspect of the proximal rectum, and no evidence of adenopathy, or distance metastatic disease. He underwent an MRI on 07/16/18 and this revealed a T1-2, N0 lesion 14 cm from the anal sphincter, and the tumor was about 4.2 cm. He underwent surgical resection on 08/07/18. His pathology revealed adenocarcinoma arising in the sigmoid colon, T3N1a based on one node positive, and the invasion of the tumor, however the majority of the cancer was felt to be T2 in nature. He has met with Dr. Delton Coombes today and is contemplating his systemic options. He comes today to have further review of our discussion with regards to radiotherapy.   PREVIOUS RADIATION THERAPY: No   PAST MEDICAL HISTORY:  Past Medical History:  Diagnosis Date  . Allergic rhinitis   . Anxiety   . Anxiety and depression    DENIES   . Bruxism   . Colon cancer ([place])   . Essential hypertension, benign   . GERD   . Hyperlipidemia    Since age 8  . Meniere's disease        PAST SURGICAL HISTORY: Past Surgical History:  Procedure Laterality Date    . BIOPSY N/A 03/20/2013   Procedure: BIOPSY;  Surgeon: [name], MD;  [facility];  Service: Endoscopy;  Laterality: N/A;  Possible small bowel biopsy  . BIOPSY  07/05/2018   Procedure: BIOPSY;  Surgeon: [name], MD;  [facility];  Service: Endoscopy;;  rectal mass  . COLONOSCOPY WITH PROPOFOL N/A 07/05/2018   Procedure: COLONOSCOPY WITH PROPOFOL;  Surgeon: [name], MD;  [facility];  Service: Endoscopy;  Laterality: N/A;  8:30am  . ESOPHAGOGASTRODUODENOSCOPY N/A 03/20/2013   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  One slightly nodular.  Small hiatal hernia.  Multiple fundic gland appearing polyps. no evidence of celiac disease, malignancy, h.pylori, barrett's.   . None    . POLYPECTOMY  07/05/2018   Procedure: POLYPECTOMY;  Surgeon: [name], MD;  [facility];  Service: Endoscopy;;  hepatic flexurex2;     FAMILY HISTORY:  Family History  Problem Relation Age of Onset  . Breast cancer Mother   . Thyroid disease Mother        Thyroid removed  . Lung cancer Mother   . Crohn's disease Mother        ???  . Heart disease Father   . Stroke Father   . Hyperlipidemia Father   . AAA (abdominal  aortic aneurysm) Father   . Valvular heart disease Brother        Bicuspid aortic valve  . Lung cancer Maternal Aunt   . Colon cancer Neg Hx      SOCIAL HISTORY:  reports that he has never smoked. He has never used smokeless tobacco. He reports that he does not drink alcohol or use drugs.   ALLERGIES: Patient has no known allergies.   MEDICATIONS:  Current Outpatient Medications  Medication Sig Dispense Refill  . acetaminophen (TYLENOL) 500 MG tablet Take 500 mg by mouth every 6 (six) hours as needed for moderate pain or headache.     . Ascorbic Acid (VITAMIN C) 1000 MG tablet Take 1,000 mg by mouth daily.     Marland Kitchen b complex vitamins capsule Take 1 capsule by mouth daily.    . capecitabine (XELODA) 150 MG tablet Take 2 tablets (300 mg total) by mouth 2 (two) times daily after a meal. Take along with the 500 mg tablets. 100 tablet 0  . capecitabine (XELODA) 500 MG tablet Take 3 tablets (1,500 mg total) by mouth 2 (two) times daily after a meal. 150 tablet 0  . cetirizine (ZYRTEC) 10 MG tablet Take 10 mg by mouth daily as needed for allergies.     . diazepam (VALIUM) 5 MG tablet Take 2.5 mg by mouth at bedtime.   1  . diphenhydrAMINE (BENADRYL) 25 mg capsule Take 25 mg by mouth daily as needed for allergies.     . fluticasone (FLONASE) 50 MCG/ACT nasal spray Place 2 sprays into the nose daily. (Patient taking differently: Place 2 sprays into the nose daily as needed (for seasonal allergies.). ) 16 g 11  . hydrochlorothiazide (MICROZIDE) 12.5 MG capsule Take 12.5 mg by mouth daily.  5  . ibuprofen (ADVIL,MOTRIN) 400 MG tablet Take 1-2 tablets (400-800 mg total) by mouth every 6 (six) hours as needed for fever, headache, mild pain or moderate pain. 30 tablet 0  . losartan (COZAAR) 100 MG tablet Take 100 mg by mouth daily.   5  . Magnesium 250 MG TABS Take 250 mg by mouth 2 (two) times daily.     . methimazole (TAPAZOLE) 5 MG tablet Take 5 mg by mouth daily.     . Multiple Vitamin (MULTIVITAMIN WITH MINERALS) TABS tablet Take 1 tablet by mouth daily. Men's One-A-Day    . pantoprazole (PROTONIX) 40 MG tablet TAKE ONE TABLET BY MOUTH TWICE DAILY BEFORE A MEAL (Patient taking differently: Take 40 mg by mouth daily. ) 60 tablet 0  . simvastatin (ZOCOR) 40 MG tablet Take 40 mg by mouth every evening.      No current facility-administered medications for this encounter.      REVIEW OF SYSTEMS: On review of systems, the patient reports that he is doing well overall. He denies any chest pain, shortness of breath, cough, fevers, chills, night sweats, unintended weight changes. He has had intermittent rectal bleeding,  but today denies any bowel or bladder disturbances, and denies abdominal pain, nausea or vomiting. He denies any new musculoskeletal or joint aches or pains. A complete review of systems is obtained and is otherwise negative.     PHYSICAL EXAM:  Wt Readings from Last 3 Encounters:  08/16/18 213 lb 3.2 oz (96.7 kg)  08/16/18 214 lb 4.8 oz (97.2 kg)  08/10/18 213 lb 11.2 oz (96.9 kg)   Temp Readings from Last 3 Encounters:  08/16/18 98 F (36.7 C) (Oral)  08/16/18 97.8 F (36.6 C) (  Oral)  08/10/18 97.7 F (36.5 C) (Oral)   BP Readings from Last 3 Encounters:  08/16/18 123/81  08/16/18 121/76  08/10/18 123/67   Pulse Readings from Last 3 Encounters:  08/16/18 63  08/16/18 82  08/10/18 63   Pain Assessment Pain Score: 1  Pain Frequency: Intermittent Pain Loc: Abdomen/10  In general this is a well appearing caucasian male in no acute distress. He is alert and oriented x4 and appropriate throughout the examination. HEENT reveals that the patient is normocephalic, atraumatic. EOMs are intact.. Cardiopulmonary assessment is negative for acute distress and he exhibits normal effort. His abdominal incisions are well healed without erythema or separation.   ECOG = 1  0 - Asymptomatic (Fully active, able to carry on all predisease activities without restriction)  1 - Symptomatic but completely ambulatory (Restricted in physically strenuous activity but ambulatory and able to carry out work of a light or sedentary nature. For example, light housework, office work)  2 - Symptomatic, <50% in bed during the day (Ambulatory and capable of all self care but unable to carry out any work activities. Up and about more than 50% of waking hours)  3 - Symptomatic, >50% in bed, but not bedbound (Capable of only limited self-care, confined to bed or chair 50% or more of waking hours)  4 - Bedbound (Completely disabled. Cannot carry on any self-care. Totally confined to bed or chair)  5 -  Death   Eustace Pen MM, Creech RH, Tormey DC, et al. [phone]). "Toxicity and response criteria of the [facility]". [place] Oncol. 5 (6): 649-55    LABORATORY DATA:  Lab Results  Component Value Date   WBC 5.0 08/10/2018   HGB 12.7 (L) 08/10/2018   HCT 37.7 (L) 08/10/2018   MCV 91.7 08/10/2018   PLT 251 08/10/2018   Lab Results  Component Value Date   NA 143 08/10/2018   K 4.4 08/10/2018   CL 109 08/10/2018   CO2 28 08/10/2018   Lab Results  Component Value Date   ALT 27 07/05/2018   AST 23 07/05/2018   ALKPHOS 78 07/05/2018   BILITOT 1.0 07/05/2018      RADIOGRAPHY: No results found.     IMPRESSION/PLAN: 1. T3,N1a,M0 adenocarcinoma of the rectosigmoid colon. Dr. Lisbeth Renshaw discusses the pathology findings and reviews the discussion from GI Oncology conference. Dr. Ulyses Jarred in pathology outlined that although by definition, the tumor is classified as T3 based on two areas of microscopic invasion, the majority of the tumor is still considered T2. In addition, Dr. Marcello Moores discussed the location of the tumor was sigmoid, above the peritoneal reflection rather than arising as a rectal cancer. Dr. Lisbeth Renshaw spent with time with the patient today outlining the recommendations for systemic therapy. Dr. Burr Medico was present for the discussion and recommended CAPEOX therapy over FOLFOX. The patient is contemplating how he wants to approach treatment. We did discuss radiotherapy as an adjunct therapy however after discussion of his case in detail in our conference, it was felt that the yield and benefit would be low and potentially expose the patient to unnecessary toxicity. We will coordinate further discussion with Dr. Delton [facility]ombes at [facility]. At the end of the conversation, the patient is in agreement with this plan.  2. Possible genetic predisposition to malignancy. The patient is still planning on meeting with genetics and will do so at [facility] in the future.  In a visit  lasting 60 minutes, greater than 50% of the  time was spent face to face discussing his case, and coordinating the patient's care.   The above documentation reflects my direct findings during this shared patient visit. Please see the separate note by Dr. Lisbeth Renshaw on this date for the remainder of the patient's plan of care.    Carola Rhine, PAC

## 2018-08-20 ENCOUNTER — Encounter (HOSPITAL_COMMUNITY): Payer: Self-pay | Admitting: *Deleted

## 2018-08-20 ENCOUNTER — Other Ambulatory Visit: Payer: Self-pay

## 2018-08-21 NOTE — Progress Notes (Signed)
START ON PATHWAY REGIMEN - Colorectal     A cycle is every 21 days:     Capecitabine      Oxaliplatin   **Always confirm dose/schedule in your pharmacy ordering system**  Patient Characteristics: Colon Adjuvant, Stage III, Low Risk (T1-3, N1) Current evidence of distant metastases<= No AJCC T Category: T3 AJCC N Category: N1a AJCC M Category: M0 AJCC 8 Stage Grouping: IIIB Intent of Therapy: Curative Intent, Discussed with Patient

## 2018-08-22 ENCOUNTER — Other Ambulatory Visit (HOSPITAL_COMMUNITY): Payer: Self-pay | Admitting: *Deleted

## 2018-08-22 DIAGNOSIS — C19 Malignant neoplasm of rectosigmoid junction: Secondary | ICD-10-CM

## 2018-08-22 MED ORDER — CAPECITABINE 500 MG PO TABS: 850.0000 mg/m2 | ORAL_TABLET | Freq: Two times a day (BID) | ORAL | 7 refills | Status: DC

## 2018-08-22 MED ORDER — LIDOCAINE-PRILOCAINE 2.5-2.5 % EX CREA: TOPICAL_CREAM | CUTANEOUS | 3 refills | Status: DC

## 2018-08-22 MED ORDER — ONDANSETRON HCL 8 MG PO TABS: 8.0000 mg | ORAL_TABLET | Freq: Three times a day (TID) | ORAL | 1 refills | Status: DC | PRN

## 2018-08-22 MED ORDER — PROCHLORPERAZINE MALEATE 10 MG PO TABS: 10.0000 mg | ORAL_TABLET | Freq: Four times a day (QID) | ORAL | 1 refills | Status: DC | PRN

## 2018-08-22 NOTE — Addendum Note (Signed)
Addended by: Darl Pikes on: 08/22/2018 09:17 AM   Modules accepted: Orders

## 2018-08-22 NOTE — Telephone Encounter (Addendum)
Oral Chemotherapy Pharmacist Encounter  Due to insurance restriction the medication could not be filled at [facility]. Prescription has been e-scribed to [facility].  Supportive information was faxed to [facility]. We will continue to follow medication access.   Called Mr. Hagemeister and provided him with an update.  Darl Pikes, PharmD, BCPS, [facility] Hematology/Oncology Clinical Pharmacist [facility] [phone]  08/22/2018 9:25 AM

## 2018-08-23 ENCOUNTER — Encounter (HOSPITAL_COMMUNITY)
Admission: RE | Disposition: A | Discharge status: Home / Self Care | Payer: Self-pay | Source: Ambulatory Visit | Attending: General Surgery

## 2018-08-23 ENCOUNTER — Encounter (HOSPITAL_COMMUNITY): Payer: Self-pay | Admitting: *Deleted

## 2018-08-23 ENCOUNTER — Ambulatory Visit (HOSPITAL_COMMUNITY)
Admission: RE | Admit: 2018-08-23 | Discharge: 2018-08-23 | Disposition: A | Discharge status: Home / Self Care | Payer: Commercial Managed Care - PPO | Source: Ambulatory Visit | Attending: General Surgery | Admitting: General Surgery

## 2018-08-23 ENCOUNTER — Ambulatory Visit (HOSPITAL_COMMUNITY): Payer: Commercial Managed Care - PPO | Admitting: Anesthesiology

## 2018-08-23 ENCOUNTER — Ambulatory Visit (HOSPITAL_COMMUNITY): Payer: Commercial Managed Care - PPO

## 2018-08-23 DIAGNOSIS — I10 Essential (primary) hypertension: Secondary | ICD-10-CM | POA: Diagnosis not present

## 2018-08-23 DIAGNOSIS — C19 Malignant neoplasm of rectosigmoid junction: Secondary | ICD-10-CM | POA: Diagnosis not present

## 2018-08-23 DIAGNOSIS — Z79899 Other long term (current) drug therapy: Secondary | ICD-10-CM | POA: Diagnosis not present

## 2018-08-23 DIAGNOSIS — Z95828 Presence of other vascular implants and grafts: Secondary | ICD-10-CM

## 2018-08-23 DIAGNOSIS — K219 Gastro-esophageal reflux disease without esophagitis: Secondary | ICD-10-CM | POA: Insufficient documentation

## 2018-08-23 DIAGNOSIS — E785 Hyperlipidemia, unspecified: Secondary | ICD-10-CM | POA: Diagnosis not present

## 2018-08-23 HISTORY — PX: PORTACATH PLACEMENT: SHX2246

## 2018-08-23 LAB — GLUCOSE, CAPILLARY: Glucose-Capillary: 80 mg/dL (ref 70–99)

## 2018-08-23 SURGERY — INSERTION, TUNNELED CENTRAL VENOUS DEVICE, WITH PORT
Anesthesia: General

## 2018-08-23 MED ORDER — ACETAMINOPHEN 325 MG PO TABS: 650.0000 mg | ORAL_TABLET | ORAL | Status: DC | PRN

## 2018-08-23 MED ORDER — MIDAZOLAM HCL 2 MG/2ML IJ SOLN
INTRAMUSCULAR | Status: DC | PRN
  Administered 2018-08-23: 2 mg via INTRAVENOUS

## 2018-08-23 MED ORDER — CEFAZOLIN SODIUM-DEXTROSE 2-4 GM/100ML-% IV SOLN
INTRAVENOUS | Status: AC
  Filled 2018-08-23: qty 100

## 2018-08-23 MED ORDER — ACETAMINOPHEN 650 MG RE SUPP
650.0000 mg | RECTAL | Status: DC | PRN
  Filled 2018-08-23: qty 1

## 2018-08-23 MED ORDER — PROPOFOL 10 MG/ML IV BOLUS
INTRAVENOUS | Status: DC | PRN
  Administered 2018-08-23: 200 mg via INTRAVENOUS

## 2018-08-23 MED ORDER — ONDANSETRON HCL 4 MG/2ML IJ SOLN
INTRAMUSCULAR | Status: AC
  Filled 2018-08-23: qty 2

## 2018-08-23 MED ORDER — LIDOCAINE 2% (20 MG/ML) 5 ML SYRINGE
INTRAMUSCULAR | Status: AC
  Filled 2018-08-23: qty 5

## 2018-08-23 MED ORDER — PROMETHAZINE HCL 25 MG/ML IJ SOLN: 6.2500 mg | INTRAMUSCULAR | Status: DC | PRN

## 2018-08-23 MED ORDER — ACETAMINOPHEN 500 MG PO TABS
ORAL_TABLET | ORAL | Status: AC
  Administered 2018-08-23: 1000 mg via ORAL
  Filled 2018-08-23: qty 2

## 2018-08-23 MED ORDER — FENTANYL CITRATE (PF) 100 MCG/2ML IJ SOLN
INTRAMUSCULAR | Status: DC | PRN
  Administered 2018-08-23 (×2): 50 ug via INTRAVENOUS

## 2018-08-23 MED ORDER — BUPIVACAINE-EPINEPHRINE 0.25% -1:200000 IJ SOLN
INTRAMUSCULAR | Status: DC | PRN
  Administered 2018-08-23: 8 mL

## 2018-08-23 MED ORDER — CEFAZOLIN SODIUM-DEXTROSE 2-4 GM/100ML-% IV SOLN
2.0000 g | INTRAVENOUS | Status: AC
  Administered 2018-08-23: 2 g via INTRAVENOUS

## 2018-08-23 MED ORDER — SODIUM CHLORIDE 0.9 % IV SOLN
Freq: Once | INTRAVENOUS | Status: AC
  Administered 2018-08-23: 20 mL
  Filled 2018-08-23: qty 1.2

## 2018-08-23 MED ORDER — MIDAZOLAM HCL 2 MG/2ML IJ SOLN: 0.5000 mg | Freq: Once | INTRAMUSCULAR | Status: DC | PRN

## 2018-08-23 MED ORDER — PROPOFOL 10 MG/ML IV BOLUS
INTRAVENOUS | Status: AC
  Filled 2018-08-23: qty 20

## 2018-08-23 MED ORDER — DEXAMETHASONE SODIUM PHOSPHATE 10 MG/ML IJ SOLN
INTRAMUSCULAR | Status: DC | PRN
  Administered 2018-08-23: 10 mg via INTRAVENOUS

## 2018-08-23 MED ORDER — FENTANYL CITRATE (PF) 100 MCG/2ML IJ SOLN
INTRAMUSCULAR | Status: AC
  Filled 2018-08-23: qty 2

## 2018-08-23 MED ORDER — SODIUM CHLORIDE 0.9 % IV SOLN: 250.0000 mL | INTRAVENOUS | Status: DC | PRN

## 2018-08-23 MED ORDER — BUPIVACAINE-EPINEPHRINE (PF) 0.5% -1:200000 IJ SOLN
INTRAMUSCULAR | Status: AC
  Filled 2018-08-23: qty 30

## 2018-08-23 MED ORDER — ONDANSETRON HCL 4 MG/2ML IJ SOLN
INTRAMUSCULAR | Status: DC | PRN
  Administered 2018-08-23: 4 mg via INTRAVENOUS

## 2018-08-23 MED ORDER — FENTANYL CITRATE (PF) 100 MCG/2ML IJ SOLN
25.0000 ug | INTRAMUSCULAR | Status: DC | PRN
  Administered 2018-08-23: 25 ug via INTRAVENOUS
  Administered 2018-08-23: 50 ug via INTRAVENOUS
  Administered 2018-08-23: 25 ug via INTRAVENOUS

## 2018-08-23 MED ORDER — MEPERIDINE HCL 50 MG/ML IJ SOLN: 6.2500 mg | INTRAMUSCULAR | Status: DC | PRN

## 2018-08-23 MED ORDER — ACETAMINOPHEN 500 MG PO TABS
1000.0000 mg | ORAL_TABLET | ORAL | Status: AC
  Administered 2018-08-23: 1000 mg via ORAL

## 2018-08-23 MED ORDER — HEPARIN SOD (PORK) LOCK FLUSH 100 UNIT/ML IV SOLN
INTRAVENOUS | Status: AC
  Filled 2018-08-23: qty 5

## 2018-08-23 MED ORDER — LIDOCAINE 2% (20 MG/ML) 5 ML SYRINGE
INTRAMUSCULAR | Status: DC | PRN
  Administered 2018-08-23: 60 mg via INTRAVENOUS

## 2018-08-23 MED ORDER — SODIUM CHLORIDE 0.9% FLUSH: 3.0000 mL | INTRAVENOUS | Status: DC | PRN

## 2018-08-23 MED ORDER — MIDAZOLAM HCL 2 MG/2ML IJ SOLN
INTRAMUSCULAR | Status: AC
  Filled 2018-08-23: qty 2

## 2018-08-23 MED ORDER — SODIUM CHLORIDE 0.9% FLUSH: 3.0000 mL | Freq: Two times a day (BID) | INTRAVENOUS | Status: DC

## 2018-08-23 MED ORDER — ONDANSETRON HCL 4 MG/2ML IJ SOLN
4.0000 mg | Freq: Once | INTRAMUSCULAR | Status: AC
  Administered 2018-08-23: 4 mg via INTRAVENOUS

## 2018-08-23 MED ORDER — DEXAMETHASONE SODIUM PHOSPHATE 10 MG/ML IJ SOLN
INTRAMUSCULAR | Status: AC
  Filled 2018-08-23: qty 1

## 2018-08-23 MED ORDER — HEPARIN SOD (PORK) LOCK FLUSH 100 UNIT/ML IV SOLN
INTRAVENOUS | Status: DC | PRN
  Administered 2018-08-23: 500 [IU]

## 2018-08-23 MED ORDER — LACTATED RINGERS IV SOLN
INTRAVENOUS | Status: DC
  Administered 2018-08-23: 12:00:00 via INTRAVENOUS

## 2018-08-23 SURGICAL SUPPLY — 29 items
BAG DECANTER FOR FLEXI CONT (MISCELLANEOUS) ×3 IMPLANT
BLADE SURG SZ11 CARB STEEL (BLADE) ×3 IMPLANT
CHLORAPREP W/TINT 26ML (MISCELLANEOUS) ×3 IMPLANT
DECANTER SPIKE VIAL GLASS SM (MISCELLANEOUS) ×3 IMPLANT
DERMABOND ADVANCED (GAUZE/BANDAGES/DRESSINGS) ×2
DERMABOND ADVANCED .7 DNX12 (GAUZE/BANDAGES/DRESSINGS) ×1 IMPLANT
DRAPE C-ARM 42X120 X-RAY (DRAPES) ×3 IMPLANT
DRAPE LAPAROTOMY TRNSV 102X78 (DRAPE) ×3 IMPLANT
ELECT PENCIL ROCKER SW 15FT (MISCELLANEOUS) ×3 IMPLANT
ELECT REM PT RETURN 15FT ADLT (MISCELLANEOUS) ×3 IMPLANT
GAUZE 4X4 16PLY RFD (DISPOSABLE) ×3 IMPLANT
GLOVE BIO SURGEON STRL SZ 6.5 (GLOVE) ×2 IMPLANT
GLOVE BIO SURGEONS STRL SZ 6.5 (GLOVE) ×1
GLOVE BIOGEL PI IND STRL 7.0 (GLOVE) ×1 IMPLANT
GLOVE BIOGEL PI INDICATOR 7.0 (GLOVE) ×2
GOWN STRL REUS W/TWL 2XL LVL3 (GOWN DISPOSABLE) ×3 IMPLANT
GOWN STRL REUS W/TWL XL LVL3 (GOWN DISPOSABLE) ×3 IMPLANT
KIT BASIN OR (CUSTOM PROCEDURE TRAY) ×3 IMPLANT
KIT PORT POWER 8FR ISP CVUE (Port) ×3 IMPLANT
NEEDLE HYPO 25X1 1.5 SAFETY (NEEDLE) ×3 IMPLANT
PACK BASIC VI WITH GOWN DISP (CUSTOM PROCEDURE TRAY) ×3 IMPLANT
SUT PROLENE 2 0 SH DA (SUTURE) ×3 IMPLANT
SUT VIC AB 3-0 SH 27 (SUTURE) ×2
SUT VIC AB 3-0 SH 27XBRD (SUTURE) ×1 IMPLANT
SUT VIC AB 4-0 PS2 18 (SUTURE) ×3 IMPLANT
SYR 10ML LL (SYRINGE) ×3 IMPLANT
SYR CONTROL 10ML LL (SYRINGE) ×3 IMPLANT
TOWEL OR 17X26 10 PK STRL BLUE (TOWEL DISPOSABLE) ×3 IMPLANT
TOWEL OR NON WOVEN STRL DISP B (DISPOSABLE) ×3 IMPLANT

## 2018-08-23 NOTE — H&P (Signed)
Jacob Hayes is an 37 y.o. male.   Chief Complaint: colon cancer HPI: Pt is s/p LAR for rectosigmoid colon cancer.  Pathology shows stage 3 disease.  He is here for port placement to start adjuvant chemotherapy.  Past Medical History:  Diagnosis Date  . Allergic rhinitis   . Anxiety   . Anxiety and depression    DENIES   . Bruxism   . Colon cancer ([place])   . Essential hypertension, benign   . GERD   . Hyperlipidemia    Since age 8  . Meniere's disease     Past Surgical History:  Procedure Laterality Date  . BIOPSY N/A 03/20/2013   Procedure: BIOPSY;  Surgeon: [name], MD;  [facility];  Service: Endoscopy;  Laterality: N/A;  Possible small bowel biopsy  . BIOPSY  07/05/2018   Procedure: BIOPSY;  Surgeon: [name], MD;  [facility];  Service: Endoscopy;;  rectal mass  . COLONOSCOPY WITH PROPOFOL N/A 07/05/2018   Procedure: COLONOSCOPY WITH PROPOFOL;  Surgeon: [name], MD;  [facility];  Service: Endoscopy;  Laterality: N/A;  8:30am  . ESOPHAGOGASTRODUODENOSCOPY N/A 03/20/2013   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  One slightly nodular.  Small hiatal hernia.  Multiple fundic gland appearing polyps. no evidence of celiac disease, malignancy, h.pylori, barrett's.   . None    . POLYPECTOMY  07/05/2018   Procedure: POLYPECTOMY;  Surgeon: [name], MD;  [facility];  Service: Endoscopy;;  hepatic flexurex2;    Family History  Problem Relation Age of Onset  . Breast cancer Mother   . Thyroid disease Mother        Thyroid removed  . Lung cancer Mother   . Crohn's disease Mother        ???  . Heart disease Father   . Stroke Father   . Hyperlipidemia Father   . AAA (abdominal aortic aneurysm) Father   . Valvular heart disease Brother        Bicuspid aortic valve  . Lung cancer Maternal Aunt   .  Colon cancer Neg Hx    Social History:  reports that he has never smoked. He has never used smokeless tobacco. He reports that he does not drink alcohol or use drugs.  Allergies: No Known Allergies  Medications Prior to Admission  Medication Sig Dispense Refill  . hydrochlorothiazide (MICROZIDE) 12.5 MG capsule Take 12.5 mg by mouth daily.  5  . losartan (COZAAR) 100 MG tablet Take 100 mg by mouth daily.   5  . Magnesium 250 MG TABS Take 250 mg by mouth 2 (two) times daily.     . pantoprazole (PROTONIX) 40 MG tablet TAKE ONE TABLET BY MOUTH TWICE DAILY BEFORE A MEAL (Patient taking differently: Take 40 mg by mouth daily. ) 60 tablet 0  . simvastatin (ZOCOR) 40 MG tablet Take 40 mg by mouth every evening.     Marland Kitchen acetaminophen (TYLENOL) 500 MG tablet Take 500 mg by mouth every 6 (six) hours as needed for moderate pain or headache.     . Ascorbic Acid (VITAMIN C) 1000 MG tablet Take 1,000 mg by mouth daily.    Marland Kitchen b complex vitamins capsule Take 1 capsule by mouth daily.    . capecitabine (XELODA) 500 MG tablet Take 4 tablets (2,000 mg total) by mouth 2 (two) times daily  after a meal. Take on days 1-14 of chemotherapy. Repeat every 21 days. 112 tablet 7  . cetirizine (ZYRTEC) 10 MG tablet Take 10 mg by mouth daily as needed for allergies.     . diazepam (VALIUM) 5 MG tablet Take 2.5 mg by mouth at bedtime.   1  . diphenhydrAMINE (BENADRYL) 25 mg capsule Take 25 mg by mouth daily as needed for allergies.     . fluticasone (FLONASE) 50 MCG/ACT nasal spray Place 2 sprays into the nose daily. (Patient taking differently: Place 2 sprays into the nose daily as needed (for seasonal allergies.). ) 16 g 11  . ibuprofen (ADVIL,MOTRIN) 400 MG tablet Take 1-2 tablets (400-800 mg total) by mouth every 6 (six) hours as needed for fever, headache, mild pain or moderate pain. 30 tablet 0  . lidocaine-prilocaine (EMLA) cream Apply small amount over port site and cover with plastic wrap one hour prior to appointment.  30 g 3  . methimazole (TAPAZOLE) 5 MG tablet Take 5 mg by mouth daily.     . Multiple Vitamin (MULTIVITAMIN WITH MINERALS) TABS tablet Take 1 tablet by mouth daily. Men's One-A-Day    . ondansetron (ZOFRAN) 8 MG tablet Take 1 tablet (8 mg total) by mouth every 8 (eight) hours as needed for nausea or vomiting. 30 tablet 1  . prochlorperazine (COMPAZINE) 10 MG tablet Take 1 tablet (10 mg total) by mouth every 6 (six) hours as needed (Nausea or vomiting). 30 tablet 1    No results found for this or any previous visit (from the past 48 hour(s)). No results found.  Review of Systems  Constitutional: Negative for chills and fever.  Eyes: Negative for blurred vision and double vision.  Respiratory: Negative for cough and shortness of breath.   Cardiovascular: Negative for chest pain and palpitations.  Gastrointestinal: Negative for abdominal pain, nausea and vomiting.  Genitourinary: Negative for dysuria, frequency and urgency.  Neurological: Negative for dizziness and headaches.    Blood pressure (!) 142/91, pulse 71, temperature 97.7 F (36.5 C), temperature source Oral, resp. rate 20, height 6\' 3"  (1.905 m), weight 96.7 kg, SpO2 99 %. Physical Exam  Constitutional: He is oriented to person, place, and time. He appears well-developed and well-nourished. No distress.  HENT:  Head: Normocephalic and atraumatic.  Eyes: Pupils are equal, round, and reactive to light. Conjunctivae and EOM are normal.  Neck: Normal range of motion.  Cardiovascular: Normal rate and regular rhythm.  Respiratory: Effort normal. No respiratory distress.  GI: Soft. There is no tenderness.  Musculoskeletal: Normal range of motion.  Neurological: He is alert and oriented to person, place, and time.     Assessment/Plan 37 y.o. M here for [facility] placement.  Risks of bleeding, infection and device malfunction discussed with pt.  Also a very small chance of Ptx.  I believe he understands this and has agreed to proceed.     Rosario Adie., MD 57/01/2201, 3:06 PM

## 2018-08-23 NOTE — Op Note (Signed)
08/23/2018  4:10 PM  PATIENT:  Jacob Hayes  37 y.o. male  Patient Care Team: Chesley Noon, MD as PCP - General (Family Medicine) [name], MD (Cardiology) [name], MD as Attending Physician (Gastroenterology)  PRE-OPERATIVE DIAGNOSIS:  STAGE 3 RECTAL CANCER  POST-OPERATIVE DIAGNOSIS:  Stage 3 rectal cancer  PROCEDURE:   INSERTION PORT-A-CATH   Surgeon(s): [name], MD  ANESTHESIA:   LMA  EBL: 17ml  Total I/O In: 900 [I.V.:900] Out: 3 [Blood:3]  DISPOSITION OF SPECIMEN:  N/A  COUNTS:  YES  PLAN OF CARE: Discharge to home after PACU  PATIENT DISPOSITION:  PACU - hemodynamically stable.  INDICATION: Patient with need for IV chemotherapy. Port-A-Cath placement was requested.   Use of a central venous catheter for intravenous therapy was discussed. Technique of catheter placement using ultrasound and fluoroscopy guidance was discussed. Risks such as bleeding, infection, pneumothorax, catheter occlusion, reoperation, and other risks were discussed. I noted a good likelihood this will help address the problem. Questions were answered. The patient expressed understanding & wishes to proceed.   Findings: Normal-appearing anatomy.   8 [place] power port. It goes through the right internal jugular vein   Procedure: Informed consent was confirmed. Patient was brought the operating room and positioned supine. Arms were tucked. The patient underwent deep sedation. Neck and chest were clipped and prepped and draped in a sterile fashion. A surgical timeout confirmed our plan. I placed a field block of local anesthesia on the chest.  I entered into the right internal jugular vein on the first venipuncture using US guidance. Non-pulsatile blood was returned. Wire was easily passed into the inferior vena cava and confirmed by fluoroscopy. I confirmed placement of the wire in the right side of the chest.  I made an incision in the lateral infraclavicular area and  made a subcutaneous pocket. I used a dilator on the wire using Seldinger technique to dilate the tract under fluoroscopy. I placed the catheter into the sheath. I then peeled away the dilator sheath. I tunneled the power port from the puncture site to the chest pocket. I cut the catheter to appropriate length and attached it to the port using the plastic connector. The port was placed into the pocket and secured to the left anterior chest wall using 2-0 Prolene interrupted stitches x2. Catheter flushed well.  Fluoroscopy confirmed the tip in the distal SVC. Catheter aspirated and flushed well. On final fluoro reevaluation the tip seen to be in good position in the distal SVC.  I closed the wounds using 3-0 Vicryl interrupted sutures for the pocket and 4-0 Monocryl stitch was used to close the skin. Dermabond was used on the 2 incisions. CXR will be performed in PACU. Patient should go home later today. Catheter is okay to use.

## 2018-08-23 NOTE — Anesthesia Postprocedure Evaluation (Signed)
Anesthesia Post Note  Patient: Jacob Hayes  Procedure(s) Performed: INSERTION PORT-A-CATH (N/A )     Patient location during evaluation: PACU Anesthesia Type: General Level of consciousness: awake and alert, oriented and patient cooperative Pain management: pain level controlled Vital Signs Assessment: post-procedure vital signs reviewed and stable Respiratory status: spontaneous breathing, nonlabored ventilation and respiratory function stable Cardiovascular status: blood pressure returned to baseline and stable Postop Assessment: no apparent nausea or vomiting Anesthetic complications: no    Last Vitals:  Vitals:   08/23/18 1619 08/23/18 1630  BP: 136/79 (!) 142/93  Pulse: 83 82  Resp: 16 (!) 21  Temp: (!) 36.4 C   SpO2: 100% 100%    Last Pain:  Vitals:   08/23/18 1619  TempSrc:   PainSc: 6                  Bhavik Cabiness,E. Keshun Berrett

## 2018-08-23 NOTE — Anesthesia Preprocedure Evaluation (Addendum)
Anesthesia Evaluation  Patient identified by MRN, date of birth, ID band Patient awake    Reviewed: Allergy & Precautions, NPO status , Patient's Chart, lab work & pertinent test results  History of Anesthesia Complications Negative for: history of anesthetic complications  Airway Mallampati: I  TM Distance: >3 FB Neck ROM: Full    Dental  (+) Dental Advisory Given, Teeth Intact   Pulmonary neg pulmonary ROS,    breath sounds clear to auscultation       Cardiovascular hypertension, Pt. on medications (-) angina Rhythm:Regular Rate:Normal  '13 ECHO: EF 60-65%, valves OK   Neuro/Psych negative neurological ROS     GI/Hepatic Neg liver ROS, GERD  Medicated and Controlled,Colon cancer   Endo/Other  negative endocrine ROS  Renal/GU negative Renal ROS     Musculoskeletal   Abdominal   Peds  Hematology negative hematology ROS (+)   Anesthesia Other Findings   Reproductive/Obstetrics                            Anesthesia Physical Anesthesia Plan  ASA: II  Anesthesia Plan: General   Post-op Pain Management:    Induction: Intravenous  PONV Risk Score and Plan: 2 and Ondansetron and Dexamethasone  Airway Management Planned: LMA  Additional Equipment:   Intra-op Plan:   Post-operative Plan:   Informed Consent: I have reviewed the patients History and Physical, chart, labs and discussed the procedure including the risks, benefits and alternatives for the proposed anesthesia with the patient or authorized representative who has indicated his/her understanding and acceptance.   Dental advisory given  Plan Discussed with: CRNA and Surgeon  Anesthesia Plan Comments: (Plan routine monitors, GA- LMA OK)        Anesthesia Quick Evaluation

## 2018-08-23 NOTE — Transfer of Care (Signed)
Immediate Anesthesia Transfer of Care Note  Patient: KIANTE CIAVARELLA  Procedure(s) Performed: INSERTION PORT-A-CATH (N/A )  Patient Location: PACU  Anesthesia Type:General  Level of Consciousness: awake and alert   Airway & Oxygen Therapy: Patient Spontanous Breathing and Patient connected to face mask oxygen  Post-op Assessment: Report given to RN and Post -op Vital signs reviewed and stable  Post vital signs: Reviewed and stable  Last Vitals:  Vitals Value Taken Time  BP 136/79 08/23/2018  4:19 PM  Temp 36.4 C 08/23/2018  4:19 PM  Pulse 92 08/23/2018  4:22 PM  Resp 17 08/23/2018  4:22 PM  SpO2 100 % 08/23/2018  4:22 PM  Vitals shown include unvalidated device data.  Last Pain:  Vitals:   08/23/18 1212  TempSrc: Oral         Complications: No apparent anesthesia complications

## 2018-08-23 NOTE — Discharge Instructions (Signed)
    PORT-A-CATH: POST OP INSTRUCTIONS  Always review your discharge instruction sheet given to you by the facility where your surgery was performed.   1. A prescription for pain medication may be given to you upon discharge. Take your pain medication as prescribed, if needed. If narcotic pain medicine is not needed, then you make take acetaminophen (Tylenol) or ibuprofen (Advil) as needed.  2. Take your usually prescribed medications unless otherwise directed. 3. If you need a refill on your pain medication, please contact our office. All narcotic pain medicine now requires a paper prescription.  Phoned in and fax refills are no longer allowed by law.  Prescriptions will not be filled after 5 pm or on weekends.  4. You should follow a light diet for the remainder of the day after your procedure. 5. Most patients will experience some mild swelling and/or bruising in the area of the incision. It may take several days to resolve. 6. It is common to experience some constipation if taking pain medication after surgery. Increasing fluid intake and taking a stool softener (such as Colace) will usually help or prevent this problem from occurring. A mild laxative (Milk of Magnesia or Miralax) should be taken according to package directions if there are no bowel movements after 48 hours.  7. Unless discharge instructions indicate otherwise, you may remove your bandages 48 hours after surgery, and you may shower at that time. You may have steri-strips (small white skin tapes) in place directly over the incision.  These strips should be left on the skin for 7-10 days.  If your surgeon used Dermabond (skin glue) on the incision, you may shower in 24 hours.  The glue will flake off over the next 2-3 weeks.  8. If your port is left accessed at the end of surgery (needle left in port), the dressing cannot get wet and should only by changed by a healthcare professional. When the port is no longer accessed (when the  needle has been removed), follow step 7.   9. ACTIVITIES:  Limit activity involving your arms for the next 72 hours. Do no strenuous exercise or activity for 1 week. You may drive when you are no longer taking prescription pain medication, you can comfortably wear a seatbelt, and you can maneuver your car. 10.You may need to see your doctor in the office for a follow-up appointment.  Please       check with your doctor.  11.When you receive a new Port-a-Cath, you will get a product guide and        ID card.  Please keep them in case you need them.  WHEN TO CALL YOUR DOCTOR ([phone]): 1. Fever over 101.0 2. Chills 3. Continued bleeding from incision 4. Increased redness and tenderness at the site 5. Shortness of breath, difficulty breathing   The clinic staff is available to answer your questions during regular business hours. Please don't hesitate to call and ask to speak to one of the nurses or medical assistants for clinical concerns. If you have a medical emergency, go to the nearest emergency room or call 911.  A surgeon from [facility] is always on call at the hospital.     For further information, please visit [email or web address]      

## 2018-08-23 NOTE — Anesthesia Procedure Notes (Signed)
Procedure Name: LMA Insertion Date/Time: 08/23/2018 3:27 PM Performed by: Cynda Familia, CRNA Pre-anesthesia Checklist: Patient identified, Emergency Drugs available, Suction available and Patient being monitored Patient Re-evaluated:Patient Re-evaluated prior to induction Oxygen Delivery Method: Circle System Utilized Preoxygenation: Pre-oxygenation with 100% oxygen Induction Type: IV induction Ventilation: Mask ventilation without difficulty LMA: LMA inserted and LMA with gastric port inserted LMA Size: 4.0 Tube type: Oral Number of attempts: 1 Airway Equipment and Method: Bite block Placement Confirmation: positive ETCO2 Tube secured with: Tape Dental Injury: Teeth and Oropharynx as per pre-operative assessment  Comments: Smooth IV inductin [place]-- LMA insertion AM CRNA atraumatic-- teeth and mouth as preop  bilat BS

## 2018-08-24 ENCOUNTER — Inpatient Hospital Stay (HOSPITAL_COMMUNITY): Payer: Commercial Managed Care - PPO | Attending: Hematology

## 2018-08-24 ENCOUNTER — Encounter (HOSPITAL_COMMUNITY): Payer: Self-pay | Admitting: General Surgery

## 2018-08-24 DIAGNOSIS — C19 Malignant neoplasm of rectosigmoid junction: Secondary | ICD-10-CM | POA: Insufficient documentation

## 2018-08-24 DIAGNOSIS — Z5111 Encounter for antineoplastic chemotherapy: Secondary | ICD-10-CM | POA: Insufficient documentation

## 2018-08-24 DIAGNOSIS — D759 Disease of blood and blood-forming organs, unspecified: Secondary | ICD-10-CM | POA: Insufficient documentation

## 2018-08-24 NOTE — Patient Instructions (Signed)
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  We are going to treat you with curative intent.  You will get chemotherapy through your port a cath and you will also be taking a chemotherapy pill.  You will be getting Oxaliplatin (Eloxatin ) via the port a cath and you will be taking Xeloda (capecitabine) by mouth.  You will take the Xeloda for 14 days and then have 7 days of rest.  You will get chemotherapy through the port once every 21 days.  These will coincide with each other.  You will start the Xeloda on the day you get chemotherapy.  You will see the doctor regularly throughout treatment.  We monitor your lab work prior to every treatment. The doctor monitors your response to treatment by the way you are feeling, your blood work, and scans periodically.  There will be wait times while you are here for treatment.  It will take about 30 minutes to 1 hour for your lab work to result.  Then there will be wait times while pharmacy mixes your medications.   You will get the following premedications prior to treatment:   Aloxi -high powered antiemetic to prevent chemotherapy induced nausea Decadron - steroid given to reduce the risk of you having an allergic type reaction to the chemotherapy.     Oxaliplatin (Generic Name) Other Names: EloxatinTM  About This Drug Oxaliplatin is used to treat cancer. It is given in the vein (IV).  Will take 2 hours to infuse and will infuse with the leucovorin.    Possible Side Effects (More Common) . Effects on the nerves are called peripheral neuropathy. You may feel numbness, tingling, or pain in your hands and feet. Cold temperatures can make it worse. Avoid cold drinks with ice. Dress warmly and cover the skin if you go out in the cold. Wear socks or gloves if you have to touch cold objects like cold flooring or items in the refrigerator or freezer. . It may be hard for you to button your  clothes, open jars, or walk as usual. The effect on the nerves may get worse with more doses of the drug. These effects get better in some people after the drug is stopped but it does not get better in all people . Bone marrow depression: This is a decrease in the number of white blood cells, red blood cells, and platelets. It may increase your risk for infection, make you tired and weak (fatigue), and raise your risk of bleeding. . Nausea and throwing up (vomiting). These symptoms may happen within a few hours after your treatment and may last up to 72 hours. Medicines are available to stop or lessen these side effects. . Electrolyte changes. Your blood will be checked for electrolyte changes as needed.  Possible Side Effects (Less Common) . Skin and tissue irritation: This may involve redness, pain, warmth, or swelling at the IV site. This happens if the drug leaks out of the vein and into nearby tissue. . Serious allergic reactions including anaphylaxis are rare. While you are getting this drug in your vein (IV), tell your nurse right away if you have any of these symptoms of an allergic reaction: . Trouble catching your breath. . Feeling like your tongue or throat are swelling. . Feeling your heart beat quickly or in a not normal way (palpitations). . Feeling dizzy or lightheaded. . Flushing, itching, rash, or hives. . Changes in your liver  function. Your doctor will check your liver function as needed.  Treating Side Effects . Do not drink cold drinks or use ice cubes in drinks. Drink fluids at room temperature or warmer. Drink through a straw. . Wear gloves to touch cold objects. Be aware that most metals are cold to the touch, especially in winter. Examples are your car door handle and your mail box latch. . Wear warm clothing in cold weather at all times. Cover your mouth and nose with a scarf or a pulldown cap (ski cap) to warm the air that goes to your lungs. . Ask your doctor or nurse  about medicine to treat nausea, throwing up, headache, or loose bowel movements. . While you are getting this drug in your vein, tell your nurse right away if you have any pain, redness, or swelling at the site of the IV infusion. . Mouth care is very important. Your mouth care should consist of routine, gentle cleaning of your teeth or dentures and rinsing your mouth with a mixture of 1/2 teaspoon of salt in 8 ounces of water or  teaspoon of baking soda in 8 ounces of water. This should be done at least after each meal and at bedtime. . Drink 6-8 cups of fluids each day unless your doctor has told you to limit your fluid intake due to some other health problem. A cup is 8 ounces of fluid. If you throw up or have loose bowel movements you should drink more fluids so that you do not become dehydrated (lack water in the body due to losing too much fluid).  Food and Drug Interactions There are no known interactions of oxaliplatin with food. This drug may interact with other medicines. Tell your doctor and pharmacist about all the medicines and dietary supplements (vitamins, minerals, herbs and others) that you are taking at this time. The safety and use of dietary supplements and alternative diets are often not known. Using these might affect your cancer or interfere with your treatment. Until more is known, you should not use dietary supplements or alternative diets without your cancer doctor's help.  When to Call the Doctor Call your doctor or nurse right away if you have any of these symptoms: . Fever of 100.5 F (38 C) or above . Chills . Easy bruising or bleeding . Wheezing or trouble breathing . Rash or itching . Feeling dizzy or lightheaded . Feeling that your heart is beating in a fast or not normal way (palpitations) . Loose bowel movements (diarrhea) more than 4 times a day or diarrhea with weakness or feeling lightheaded . Blurred vision or other changes in eyesight . Pain when passing  urine; blood in urine . Pain in your lower back or side . Feeling confused or agitated . Nausea that stops you from eating or drinking . Throwing up more than 3 times a day . Chest pain or symptoms of a heart attack. Most heart attacks involve pain in the center of the chest that lasts more than a few minutes. The pain may go away and come back. It can feel like pressure, squeezing, fullness, or pain. Sometimes pain is felt in one or both arms, the back, neck, jaw, or stomach. If any of these symptoms last 2 minutes, call 911. Marland Kitchen Symptoms of a stroke such as sudden numbness or weakness of your face, arm, or leg, mostly on one side of your body; sudden confusion, trouble speaking or understanding; sudden trouble seeing in one or both eyes;  sudden trouble walking, feeling dizzy, loss of balance or coordination; or sudden, bad headache with no known cause. If you have any of these symptoms for 2 minutes, call 911. . Signs of liver problems: dark urine, pale bowel movements, bad stomach pain, feeling very tired and weak, itching, or yellowing of the eyes or skin. Call your doctor or nurse as soon as possible if any of these symptoms happen: . Change in hearing, ringing in the ears . Decreased urine . Unusual thirst or passing urine often . Pain in your mouth or throat that makes it hard to eat or drink . Nausea that is not relieved by prescribed medicines . Rash that is not relieved by prescribed medicines . Heavy menstrual period that lasts longer than normal . Numbness, tingling, decreased feeling or weakness in fingers, toes, arms, or legs . Trouble walking or changes in the way you walk, feeling clumsy when buttoning clothes, opening jars, or other routine hand motions . Swelling of legs, ankles, or feet . Weight gain of 5 pounds in one week (fluid retention) . Lasting loss of appetite or rapid weight loss of five pounds in a week . Fatigue that interferes with your daily activities . Headache  that does not go away . Painful, red, or swollen areas on your hands or feet. . No bowel movement for 3 days or you feel uncomfortable . Extreme weakness that interferes with normal activities  Reproduction Concerns Infertility Warning: Sexual problems and reproduction concerns may happen. In both men and women, this drug may affect your ability to have children. This cannot be determined before your treatment. Talk with your doctor or nurse if you plan to have children. Ask for information on sperm or egg banking. In men, this drug may interfere with your ability to make sperm, but it should not change your ability to have sexual relations. In women, menstrual bleeding may become irregular or stop while you are getting this drug. Do not assume that you cannot become pregnant if you do not have a menstrual period. Women may go through signs of menopause (change of life) like vaginal dryness or itching. Vaginal lubricants can be used to lessen vaginal dryness, itching, and pain during sexual relations. Genetic counseling is available for you to talk about the effects of this drug therapy on future pregnancies. Also, a genetic counselor can look at the possible risk of problems in the unborn baby due to this medicine if an exposure happens during pregnancy. Pregnancy warning: This drug may have harmful effects on the unborn child, so effective methods of birth control should be used during your cancer treatment. Breast feeding warning: It is not known if this drug passes into breast milk. For this reason, women should talk to their doctor about the risks and benefits of breast feeding during treatment with this drug because this drug may enter the breast milk and badly harm a breast feeding baby.  Capecitabine (Xeloda)  About This Drug Capecitabine is used to treat cancer. It is given orally (by mouth).  Possible Side Effects . Tiredness and weakness . Loose bowel movements (diarrhea) . Nausea and  throwing up (vomiting) . Pain in your abdomen . Hand-and-foot syndrome. The palms of your hands or soles of your feet may tingle, become numb, painful, swollen, or red. . Decrease in red blood cells. This may make you feel more tired. . Increased total bilirubin in your blood. This may mean that you have changes in your liver function. Note: Each  of the side effects above was reported in 30% or greater of patients treated with capecitabine. Not all possible side effects are included above.  Warnings and Precautions . Abnormal bleeding if you are taking blood thinners such as warfarin - symptoms may be coughing up blood, throwing up blood (may look like coffee grounds), red or black tarry bowel movements, abnormally heavy menstrual flow, nosebleeds or any other unusual bleeding. . Severe diarrhea . Changes in the tissue of the heart and/or heart attack. Some changes may happen that can cause your heart to have less ability to pump blood. . Increase risk of severe side effects if you have a known Dihydropyrimidine dehydrogenase deficiency. . Dehydration (lack of water in the body from losing too much fluid), which may affect how your kidneys work which can be life-threatening . Severe allergic skin reaction. You may develop blisters on your skin that are filled with fluid or a severe red rash all over your body that may be painful. . Decrease in the number of white blood cells, red blood cells, and platelets. This may raise your risk of infection, make you tired and weak (fatigue), and raise your risk of bleeding . Patients greater than 66 years of age are at increased risk of severe and life-threatening side effects . Changes in your liver function, which can cause liver failure Note: Some of the side effects above are very rare. If you have concerns and/or questions, please discuss them with your medical team.  How to Take Your Medication . Swallow the medicine whole with water within  30 minutes after a meal. Do not break or crush it. . Missed dose: If you vomit or miss a dose, take your next dose at the regular time, and contact your physician. Do not take 2 doses at the same time and do not double up on the next dose. Marland Kitchen Handling: Wash your hands after handling your medicine, your caretakers should not handle your medicine with bare hands and should wear latex gloves. . This drug may be present in the saliva, tears, sweat, urine, stool, vomit, semen, and vaginal secretions. Talk to your doctor and/or your nurse about the necessary precautions to take during this time. . Storage: Store this medicine in the original container at room temperature. Discuss with your nurse or your doctor how to dispose of unused medicine.  Treating Side Effects . Drink plenty of fluids (a minimum of eight glasses per day is recommended). . If you throw up or have loose bowel movements, you should drink more fluids so that you do not become dehydrated (lack water in the body from losing too much fluid). . If you get diarrhea, eat low-fiber foods that are high in protein and calories and avoid foods that can irritate your digestive tracts or lead to cramping. . Ask your nurse or doctor about medicine that can lessen or stop your diarrhea. . To help with nausea and vomiting, eat small, frequent meals instead of three large meals a day. Choose foods and drinks that are at room temperature. Ask your nurse or doctor about other helpful tips and medicine that is available to help or stop lessen these symptoms. . Manage tiredness by pacing your activities for the day. . Be sure to include periods of rest between energy-draining activities. . To decrease infection, wash your hands regularly. . Avoid close contact with people who have a cold, the flu, or other infections. . Take your temperature as your doctor or nurse tells you,  and whenever you feel like you may have a fever. . To help decrease  bleeding, use a soft toothbrush. Check with your nurse before using dental floss. . Be very careful when using knives or tools. . Use an electric shaver instead of a razor. Marland Kitchen Keeping your pain under control is important to your well-being. Please tell your doctor or nurse if you are experiencing pain. . Avoid sun exposure and apply sunscreen routinely when outdoors. . If you get a rash do not put anything on it unless your doctor or nurse says you may. Keep the area around the rash clean and dry. Ask your doctor for medicine if your rash bothers you.  Food and Drug Interactions . There are no known interactions of capecitabine with food, however this medication should be taken within 30 minutes after a meal. . Check with your doctor or pharmacist about all other prescription medicines and dietary supplements you are taking before starting this medicine as there are known drug interactions with capecitabine. Also, check with your doctor or pharmacist before starting any new prescription or over-the-counter medicines, or dietary supplement to make sure that there are no interactions. . There are known interactions of capecitabine with blood thinning medicine such as warfarin. Ask your doctor what precautions you should take.  When to Call the Doctor Call your doctor or nurse if you have any of these symptoms and/or any new or unusual symptoms: . Fever of 100.5 F (38 C) or higher . Chills . Trouble breathing . Feeling that your heart is beating in a fast or not normal way (palpitations) . Pain in your chest . Chest pain or symptoms of a heart attack. Most heart attacks involve pain in the center of the chest that lasts more than a few minutes. The pain may go away and come back or it can be constant. It can feel like pressure, squeezing, fullness, or pain. Sometimes pain is felt in one or both arms, the back, neck, jaw, or stomach. If any of these symptoms last 2 minutes, call 911. .  Fatigue that interferes with your daily activities . Feeling dizzy or lightheaded . Easy bleeding or bruising . Blood in your urine, vomit (bright red or coffee-ground) and/or stools ( bright red, or black/tarry) . Coughing up blood . Decreased urine . Loose bowel movements (diarrhea) 4 times a day or loose bowel movements with lack of strength or a feeling of being dizzy . Nausea that stops you from eating or drinking and/or is not relieved by prescribed medicines . Throwing up more than 3 times a day . Lasting loss of appetite or rapid weight loss of five pounds in a week . Pain that does not go away or is not relieved by prescribed medicine . Signs of possible liver problems: dark urine, pale bowel movements, bad stomach pain, feeling very tired and weak, unusual itching, or yellowing of the eyes or skin . Painful, red, or swollen areas on your hands or feet. . Numbness and/or tingling of your hands and/or feet . A new rash or a rash that is not relieved by prescribed medicines . Flu-like symptoms: fever, headache, muscle and joint aches, and fatigue (low energy, feeling weak) . If you think you may be pregnant or may have impregnated your partner  Reproduction Warnings . Pregnancy warning: This drug can have harmful effects on the unborn baby. Women of child bearing potential should use effective methods of birth control during your cancer treatment and for at  least 6 months after treatment. Men with male partners of child bearing potential should use effective methods of birth control during your cancer treatment and for at least 3 months after your cancer treatment. Let your doctor know right away if you think you may be pregnant or may have impregnated your partner. . Breastfeeding warning: Women should not breast feed during treatment and for 2 weeks month after treatment because this drug could enter the breast milk and cause harm to a breast feeding baby. . Fertility  warning: In men and women both, this drug may affect your ability to have children in the future. Talk with your doctor or nurse if you plan to have children. Ask for information on sperm or egg banking.   SELF CARE ACTIVITIES WHILE ON CHEMOTHERAPY:  Hydration Increase your fluid intake 48 hours prior to treatment and drink at least 8 to 12 cups (64 ounces) of water/decaffeinated beverages per day after treatment. You can still have your cup of coffee or soda but these beverages do not count as part of your 8 to 12 cups that you need to drink daily. No alcohol intake.  Medications Continue taking your normal prescription medication as prescribed.  If you start any new herbal or new supplements please let us know first to make sure it is safe.  Mouth Care Have teeth cleaned professionally before starting treatment. Keep dentures and partial plates clean. Use soft toothbrush and do not use mouthwashes that contain alcohol. Biotene is a good mouthwash that is available at most pharmacies or may be ordered by calling [phone]. Use warm salt water gargles (1 teaspoon salt per 1 quart warm water) before and after meals and at bedtime. Or you may rinse with 2 tablespoons of three-percent hydrogen peroxide mixed in eight ounces of water. If you are still having problems with your mouth or sores in your mouth please call the clinic. If you need dental work, please let the doctor know before you go for your appointment so that we can coordinate the best possible time for you in regards to your chemo regimen. You need to also let your dentist know that you are actively taking chemo. We may need to do labs prior to your dental appointment.  Skin Care Always use sunscreen that has not expired and with SPF (Sun Protection Factor) of 50 or higher. Wear hats to protect your head from the sun. Remember to use sunscreen on your hands, ears, face, & feet.  Use good moisturizing lotions such as udder cream,  eucerin, or even Vaseline. Some chemotherapies can cause dry skin, color changes in your skin and nails.    . Avoid long, hot showers or baths. . Use gentle, fragrance-free soaps and laundry detergent. . Use moisturizers, preferably creams or ointments rather than lotions because the thicker consistency is better at preventing skin dehydration. Apply the cream or ointment within 15 minutes of showering. Reapply moisturizer at night, and moisturize your hands every time after you wash them.  Hair Loss (if your doctor says your hair will fall out)  . If your doctor says that your hair is likely to fall out, decide before you begin chemo whether you want to wear a wig. You may want to shop before treatment to match your hair color. . Hats, turbans, and scarves can also camouflage hair loss, although some people prefer to leave their heads uncovered. If you go bare-headed outdoors, be sure to use sunscreen on your scalp. . Cut your  hair short. It eases the inconvenience of shedding lots of hair, but it also can reduce the emotional impact of watching your hair fall out. . Don't perm or color your hair during chemotherapy. Those chemical treatments are already damaging to hair and can enhance hair loss. Once your chemo treatments are done and your hair has grown back, it's OK to resume dyeing or perming hair. With chemotherapy, hair loss is almost always temporary. But when it grows back, it may be a different color or texture. In older adults who still had hair color before chemotherapy, the new growth may be completely gray.  Often, new hair is very fine and soft.  Infection Prevention Please wash your hands for at least 30 seconds using warm soapy water. Handwashing is the #1 way to prevent the spread of germs. Stay away from sick people or people who are getting over a cold. If you develop respiratory systems such as green/yellow mucus production or productive cough or persistent cough let us know and  we will see if you need an antibiotic. It is a good idea to keep a pair of gloves on when going into grocery stores/[employer] to decrease your risk of coming into contact with germs on the carts, etc. Carry alcohol hand gel with you at all times and use it frequently if out in public. If your temperature reaches 100.5 or higher please call the clinic and let us know.  If it is after hours or on the weekend please go to the ER if your temperature is over 100.5.  Please have your own personal thermometer at home to use.    Sex and bodily fluids If you are going to have sex, a condom must be used to protect the person that isn't taking chemotherapy. Chemo can decrease your libido (sex drive). For a few days after chemotherapy, chemotherapy can be excreted through your bodily fluids.  When using the toilet please close the lid and flush the toilet twice.  Do this for a few day after you have had chemotherapy.   Effects of chemotherapy on your sex life Some changes are simple and won't last long. They won't affect your sex life permanently. Sometimes you may feel: . too tired . not strong enough to be very active . sick or sore  . not in the mood . anxious or low Your anxiety might not seem related to sex. For example, you may be worried about the cancer and how your treatment is going. Or you may be worried about money, or about how you family are coping with your illness. These things can cause stress, which can affect your interest in sex. It's important to talk to your partner about how you feel. Remember - the changes to your sex life don't usually last long. There's usually no medical reason to stop having sex during chemo. The drugs won't have any long term physical effects on your performance or enjoyment of sex. Cancer can't be passed on to your partner during sex  Contraception It's important to use reliable contraception during treatment. Avoid getting pregnant while you or your partner are  having chemotherapy. This is because the drugs may harm the baby. Sometimes chemotherapy drugs can leave a man or woman infertile.  This means you would not be able to have children in the future. You might want to talk to someone about permanent infertility. It can be very difficult to learn that you may no longer be able to have children. Some  people find counselling helpful. There might be ways to preserve your fertility, although this is easier for men than for women. You may want to speak to a fertility expert. You can talk about sperm banking or harvesting your eggs. You can also ask about other fertility options, such as donor eggs. If you have or have had breast cancer, your doctor might advise you not to take the contraceptive pill. This is because the hormones in it might affect the cancer.  It is not known for sure whether or not chemotherapy drugs can be passed on through semen or secretions from the vagina. Because of this some doctors advise people to use a barrier method if you have sex during treatment. This applies to vaginal, anal or oral sex. Generally, doctors advise a barrier method only for the time you are actually having the treatment and for about a week after your treatment. Advice like this can be worrying, but this does not mean that you have to avoid being intimate with your partner. You can still have close contact with your partner and continue to enjoy sex.  Animals If you have cats or birds we just ask that you not change the litter or change the cage.  Please have someone else do this for you while you are on chemotherapy.   Food Safety During and After Cancer Treatment Food safety is important for people both during and after cancer treatment. Cancer and cancer treatments, such as chemotherapy, radiation therapy, and stem cell/bone marrow transplantation, often weaken the immune system. This makes it harder for your body to protect itself from foodborne illness, also  called food poisoning. Foodborne illness is caused by eating food that contains harmful bacteria, parasites, or viruses.  Foods to avoid Some foods have a higher risk of becoming tainted with bacteria. These include: Marland Kitchen Unwashed fresh fruit and vegetables, especially leafy vegetables that can hide dirt and other contaminants . Raw sprouts, such as alfalfa sprouts . Raw or undercooked beef, especially ground beef, or other raw or undercooked meat and poultry . Fatty, fried, or spicy foods immediately before or after treatment.  These can sit heavy on your stomach and make you feel nauseous. . Raw or undercooked shellfish, such as oysters. . Sushi and sashimi, which often contain raw fish.  . Unpasteurized beverages, such as unpasteurized fruit juices, raw milk, raw yogurt, or cider . Undercooked eggs, such as soft boiled, over easy, and poached; raw, unpasteurized eggs; or foods made with raw egg, such as homemade raw cookie dough and homemade mayonnaise Simple steps for food safety Shop smart. . Do not buy food stored or displayed in an unclean area. . Do not buy bruised or damaged fruits or vegetables. . Do not buy cans that have cracks, dents, or bulges. . Pick up foods that can spoil at the end of your shopping trip and store them in a cooler on the way home. Prepare and clean up foods carefully. . Rinse all fresh fruits and vegetables under running water, and dry them with a clean towel or paper towel. . Clean the top of cans before opening them. . After preparing food, wash your hands for 20 seconds with hot water and soap. Pay special attention to areas between fingers and under nails. . Clean your utensils and dishes with hot water and soap. Marland Kitchen Disinfect your kitchen and cutting boards using 1 teaspoon of liquid, unscented bleach mixed into 1 quart of water.   Dispose of old food. Marland Kitchen  Eat canned and packaged food before its expiration date (the "use by" or "best before" date). . Consume  refrigerated leftovers within 3 to 4 days. After that time, throw out the food. Even if the food does not smell or look spoiled, it still may be unsafe. Some bacteria, such as Listeria, can grow even on foods stored in the refrigerator if they are kept for too long. Take precautions when eating out. . At restaurants, avoid buffets and salad bars where food sits out for a long time and comes in contact with many people. Food can become contaminated when someone with a virus, often a norovirus, or another "bug" handles it. . Put any leftover food in a "to-go" container yourself, rather than having the server do it. And, refrigerate leftovers as soon as you get home. . Choose restaurants that are clean and that are willing to prepare your food as you order it cooked.   MEDICATIONS:                                                                                                                                                                Compazine/Prochlorperazine 10mg  tablet. Take 1 tablet every 6 hours as needed for nausea/vomiting. (This can make you sleepy)  Zofran / Ondansetron - Take 1 tablet every 8 hours as needed for nausea/vomiting ( this can make you constipated)  EMLA cream. Apply a quarter size amount to port site 1 hour prior to chemo. Do not rub in. Cover with plastic wrap.   Over-the-Counter Meds:  Colace - 100 mg capsules - take 2 capsules daily.  If this doesn't help then you can increase to 2 capsules twice daily.  Call us if this does not help your bowels move.   Imodium 2mg  capsule. Take 2 capsules after the 1st loose stool and then 1 capsule every 2 hours until you go a total of 12 hours without having a loose stool. Call the [facility] if loose stools continue. If diarrhea occurs at bedtime, take 2 capsules at bedtime. Then take 2 capsules every 4 hours until morning. Call [facility].    Diarrhea Sheet   If you are having loose stools/diarrhea, please purchase  Imodium and begin taking as outlined:  At the first sign of poorly formed or loose stools you should begin taking Imodium (loperamide) 2 mg capsules.  Take two caplets (4mg ) followed by one caplet (2mg ) every 2 hours until you have had no diarrhea for 12 hours.  During the night take two caplets (4mg ) at bedtime and continue every 4 hours during the night until the morning.  Stop taking Imodium only after there is no sign of diarrhea for 12 hours.    Always call the [facility] if you are having loose  stools/diarrhea that you can't get under control.  Loose stools/diarrhea leads to dehydration (loss of water) in your body.  We have other options of trying to get the loose stools/diarrhea to stop but you must let us know!   Constipation Sheet  Colace - 100 mg capsules - take 2 capsules daily.  If this doesn't help then you can increase to 2 capsules twice daily.  Please call if the above does not work for you.   Do not go more than 2 days without a bowel movement.  It is very important that you do not become constipated.  It will make you feel sick to your stomach (nausea) and can cause abdominal pain and vomiting.   Nausea Sheet   Compazine/Prochlorperazine 10mg  tablet. Take 1 tablet every 6 hours as needed for nausea/vomiting. (This can make you sleepy)  Zofran / Ondansetron - Take 1 tablet every 8 hours as needed for nausea/vomiting (This can make you constipated)  If you are having persistent nausea (nausea that does not stop) please call the [facility] and let us know the amount of nausea that you are experiencing.  If you begin to vomit, you need to call the [facility] and if it is the weekend and you have vomited more than one time and can't get it to stop-go to the Emergency Room.  Persistent nausea/vomiting can lead to dehydration (loss of fluid in your body) and will make you feel terrible.   Ice chips, sips of clear liquids, foods that are @ room temperature, crackers, and  toast tend to be better tolerated.   SYMPTOMS TO REPORT AS SOON AS POSSIBLE AFTER TREATMENT:   FEVER GREATER THAN 100.5 F  CHILLS WITH OR WITHOUT FEVER  NAUSEA AND VOMITING THAT IS NOT CONTROLLED WITH YOUR NAUSEA MEDICATION  UNUSUAL SHORTNESS OF BREATH  UNUSUAL BRUISING OR BLEEDING  TENDERNESS IN MOUTH AND THROAT WITH OR WITHOUT PRESENCE OF ULCERS  URINARY PROBLEMS  BOWEL PROBLEMS  UNUSUAL RASH      Wear comfortable clothing and clothing appropriate for easy access to any Portacath or PICC line. Let us know if there is anything that we can do to make your therapy better!    What to do if you need assistance after hours or on the weekends: CALL [phone].  HOLD on the line, do not hang up.  You will hear multiple messages but at the end you will be connected with a nurse triage line.  They will contact the doctor if necessary.  Most of the time they will be able to assist you.  Do not call the hospital operator.      I have been informed and understand all of the instructions given to me and have received a copy. I have been instructed to call the clinic [phone] or my family physician as soon as possible for continued medical care, if indicated. I do not have any more questions at this time but understand that I may call the [facility] or the Patient Navigator at [phone] during office hours should I have questions or need assistance in obtaining follow-up care.

## 2018-08-24 NOTE — Progress Notes (Signed)
Chemotherapy teaching pulled together. 

## 2018-08-27 ENCOUNTER — Telehealth (HOSPITAL_COMMUNITY): Payer: Self-pay | Admitting: Pharmacy Technician

## 2018-08-27 NOTE — Telephone Encounter (Signed)
Oral Oncology [facility] Encounter  Called to check the status of Xeloda at [facility].  Prescription was delivered to the [facility] on 08/25/18.   Copay was $5.00.  Jacob [facility] [place] Phone [phone] Fax [phone] 08/27/2018 9:50 AM

## 2018-08-28 ENCOUNTER — Telehealth (HOSPITAL_COMMUNITY): Payer: Self-pay | Admitting: Pharmacist

## 2018-08-28 ENCOUNTER — Other Ambulatory Visit (HOSPITAL_COMMUNITY): Payer: Self-pay | Admitting: Hematology

## 2018-08-28 ENCOUNTER — Other Ambulatory Visit (HOSPITAL_COMMUNITY): Payer: Self-pay

## 2018-08-28 DIAGNOSIS — C19 Malignant neoplasm of rectosigmoid junction: Secondary | ICD-10-CM

## 2018-08-28 NOTE — Telephone Encounter (Signed)
Oral Chemotherapy Pharmacist Encounter  Mr. Bilbo gave me a call to let me know he received his capecitabine in the mail. His first infusion is scheduled for tomorrow and he knows to start the capecitabine tomorrow in coordination with his infusion Day 1.  Patient Education I spoke with patient for overview of new oral chemotherapy medication: Xeloda (capecitabine) for the treatment of Stage III colorectal adenocarcinoma in conjunction with oxaliplatin, planned duration until disease progression or unacceptable drug toxicity.   Pt is doing well. Counseled patient on administration, dosing, side effects, monitoring, drug-food interactions, safe handling, storage, and disposal. Patient will take 4 tablets (2,000 mg total) by mouth 2 (two) times daily after a meal. Take on days 1-14 of chemotherapy. Repeat every 21 days.  Side effects include but not limited to: diarrhea, hand-foot syndrome, N/V, decrease wbc/plt/hgb, fatigue, mouth irritation.    Reviewed with patient importance of keeping a medication schedule and plan for any missed doses.  Mr. Yankee voiced understanding and appreciation. All questions answered. Medication handout placed in the mail.  Provided patient with [facility] phone number. Patient knows to call the office with questions or concerns. Oral Chemotherapy [facility] will continue to follow.  Darl Pikes, PharmD, BCPS, [facility] Hematology/Oncology Clinical Pharmacist [facility] [phone]  08/28/2018 1:35 PM

## 2018-08-29 ENCOUNTER — Ambulatory Visit (HOSPITAL_COMMUNITY)
Admission: RE | Admit: 2018-08-29 | Discharge: 2018-08-29 | Disposition: A | Discharge status: Home / Self Care | Payer: Commercial Managed Care - PPO | Source: Ambulatory Visit | Attending: Hematology | Admitting: Hematology

## 2018-08-29 ENCOUNTER — Inpatient Hospital Stay (HOSPITAL_COMMUNITY): Payer: Commercial Managed Care - PPO

## 2018-08-29 ENCOUNTER — Other Ambulatory Visit: Payer: Self-pay

## 2018-08-29 ENCOUNTER — Encounter (HOSPITAL_COMMUNITY): Payer: Self-pay

## 2018-08-29 ENCOUNTER — Encounter (HOSPITAL_COMMUNITY): Payer: Self-pay | Admitting: *Deleted

## 2018-08-29 VITALS — BP 132/75 | HR 66 | Temp 98.2°F | Resp 18 | Wt 212.8 lb

## 2018-08-29 DIAGNOSIS — D759 Disease of blood and blood-forming organs, unspecified: Secondary | ICD-10-CM | POA: Diagnosis not present

## 2018-08-29 DIAGNOSIS — Z5111 Encounter for antineoplastic chemotherapy: Secondary | ICD-10-CM | POA: Diagnosis not present

## 2018-08-29 DIAGNOSIS — M436 Torticollis: Secondary | ICD-10-CM

## 2018-08-29 DIAGNOSIS — C19 Malignant neoplasm of rectosigmoid junction: Secondary | ICD-10-CM

## 2018-08-29 LAB — COMPREHENSIVE METABOLIC PANEL
ALBUMIN: 4.2 g/dL (ref 3.5–5.0)
ALK PHOS: 71 U/L (ref 38–126)
ALT: 14 U/L (ref 0–44)
ANION GAP: 8 (ref 5–15)
AST: 17 U/L (ref 15–41)
BUN: 19 mg/dL (ref 6–20)
CHLORIDE: 103 mmol/L (ref 98–111)
CO2: 27 mmol/L (ref 22–32)
Calcium: 9.2 mg/dL (ref 8.9–10.3)
Creatinine, Ser: 1.09 mg/dL (ref 0.61–1.24)
GFR calc non Af Amer: >60 mL/min (ref >60)
Glucose, Bld: 120 mg/dL — ABNORMAL HIGH (ref 70–99)
POTASSIUM: 3.7 mmol/L (ref 3.5–5.1)
SODIUM: 138 mmol/L (ref 135–145)
Total Bilirubin: 0.4 mg/dL (ref 0.3–1.2)
Total Protein: 7.3 g/dL (ref 6.5–8.1)

## 2018-08-29 LAB — CBC WITH DIFFERENTIAL/PLATELET
Abs Immature Granulocytes: 0 10*3/uL (ref 0.00–0.07)
BASOS ABS: 0 10*3/uL (ref 0.0–0.1)
Basophils Relative: 1 %
Eosinophils Absolute: 0.1 10*3/uL (ref 0.0–0.5)
Eosinophils Relative: 2 %
HCT: 44.7 % (ref 39.0–52.0)
HEMOGLOBIN: 14.8 g/dL (ref 13.0–17.0)
Immature Granulocytes: 0 %
LYMPHS PCT: 28 %
Lymphs Abs: 1.1 10*3/uL (ref 0.7–4.0)
MCH: 31.1 pg (ref 26.0–34.0)
MCHC: 33.1 g/dL (ref 30.0–36.0)
MCV: 93.9 fL (ref 80.0–100.0)
Monocytes Absolute: 0.3 10*3/uL (ref 0.1–1.0)
Monocytes Relative: 7 %
NEUTROS ABS: 2.5 10*3/uL (ref 1.7–7.7)
NRBC: 0 % (ref 0.0–0.2)
Neutrophils Relative %: 62 %
PLATELETS: 283 10*3/uL (ref 150–400)
RBC: 4.76 MIL/uL (ref 4.22–5.81)
RDW: 12.1 % (ref 11.5–15.5)
WBC: 4.1 10*3/uL (ref 4.0–10.5)

## 2018-08-29 MED ORDER — PALONOSETRON HCL INJECTION 0.25 MG/5ML
0.2500 mg | Freq: Once | INTRAVENOUS | Status: AC
  Administered 2018-08-29: 0.25 mg via INTRAVENOUS
  Filled 2018-08-29: qty 5

## 2018-08-29 MED ORDER — SODIUM CHLORIDE 0.9 % IV SOLN
10.0000 mg | Freq: Once | INTRAVENOUS | Status: AC
  Administered 2018-08-29: 10 mg via INTRAVENOUS
  Filled 2018-08-29: qty 1

## 2018-08-29 MED ORDER — OXALIPLATIN CHEMO INJECTION 100 MG/20ML
132.0000 mg/m2 | Freq: Once | INTRAVENOUS | Status: AC
  Administered 2018-08-29: 300 mg via INTRAVENOUS
  Filled 2018-08-29: qty 60

## 2018-08-29 MED ORDER — DEXTROSE 5 % IV SOLN
Freq: Once | INTRAVENOUS | Status: AC
  Administered 2018-08-29: 10:00:00 via INTRAVENOUS

## 2018-08-29 MED ORDER — HEPARIN SOD (PORK) LOCK FLUSH 100 UNIT/ML IV SOLN
500.0000 [IU] | Freq: Once | INTRAVENOUS | Status: AC | PRN
  Administered 2018-08-29: 500 [IU]

## 2018-08-29 NOTE — Progress Notes (Signed)
Patient has limited ROM in neck when turning neck from right to left. Patient can't turn his neck past 20-30 degrees to the left. Patient has to use his hand to lift his head off of his pillow because sitting straight up sends a shooting pain through the catheter area in his right neck. Port gives excellent blood return per Anastasio Champion RN. Patient being sent for an ultrasound of the right upper extremity per radiology/MD orders. Dr. Marcello Moores surgeon notified of patient's condition and she said she thought the port was safe to use and that he probably has some inflammation around the nerve in his neck. Patient to schedule a follow up appt with Dr. Marcello Moores for Monday or Tuesday of next week. Patient notified.

## 2018-08-29 NOTE — Progress Notes (Deleted)
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      INTERVAL HISTORY:  Jacob [facility] 37 y.o. male returns for routine follow-up for rectal adenocarcinoma. Patient is here today with his   REVIEW OF SYSTEMS:  Review of Systems - Oncology   PAST MEDICAL/SURGICAL HISTORY:  Past Medical History:  Diagnosis Date  . Allergic rhinitis   . Anxiety   . Anxiety and depression    DENIES   . Bruxism   . Colon cancer ([place])   . Essential hypertension, benign   . GERD   . Hyperlipidemia    Since age 28  . Meniere's disease    Past Surgical History:  Procedure Laterality Date  . BIOPSY N/A 03/20/2013   Procedure: BIOPSY;  Surgeon: [name], MD;  Location: [facility];  Service: Endoscopy;  Laterality: N/A;  Possible small bowel biopsy  . BIOPSY  07/05/2018   Procedure: BIOPSY;  Surgeon: [name], MD;  Location: [facility];  Service: Endoscopy;;  rectal mass  . COLONOSCOPY WITH PROPOFOL N/A 07/05/2018   Procedure: COLONOSCOPY WITH PROPOFOL;  Surgeon: [name], MD;  Location: [facility];  Service: Endoscopy;  Laterality: N/A;  8:30am  . ESOPHAGOGASTRODUODENOSCOPY N/A 03/20/2013   [name], in the upper esophagus, through the upper esophageal sphincter, multiple areas of salmon-colored epithelium consistent with inlet  patches.  One slightly nodular.  Small hiatal hernia.  Multiple fundic gland appearing polyps. no evidence of celiac disease, malignancy, h.pylori, barrett's.   . None    . POLYPECTOMY  07/05/2018   Procedure: POLYPECTOMY;  Surgeon: [name], MD;  Location: [facility];  Service: Endoscopy;;  hepatic flexurex2;  . PORTACATH PLACEMENT N/A 08/23/2018   Procedure: INSERTION PORT-A-CATH;  Surgeon: [name], MD;  Location: [facility];  Service: General;  Laterality: N/A;  LMA     SOCIAL HISTORY:  Social History   Socioeconomic History  . Marital status: Divorced    Spouse name: Not on file  . Number of children: 2  . Years of education: Not on file  . Highest education level: Not on file  Occupational History  . Occupation: works with Sales executive at [employer]  . Financial resource strain: Not on file  . Food insecurity:    Worry: Not on file    Inability: Not on file  . Transportation needs:    Medical: Not on file    Non-medical: Not on file  Tobacco Use  . Smoking status: Never Smoker  . Smokeless tobacco: Never Used  Substance and Sexual Activity  . Alcohol use: No  . Drug use: No  . Sexual activity: Yes  Lifestyle  . Physical activity:    Days per week: Not on file    Minutes per session: Not  on file  . Stress: Not on file  Relationships  . Social connections:    Talks on phone: Not on file    Gets together: Not on file    Attends religious service: Not on file    Active member of club or organization: Not on file    Attends meetings of clubs or organizations: Not on file    Relationship status: Not on file  . Intimate partner violence:    Fear of current or ex partner: Not on file    Emotionally abused: Not on file    Physically abused: Not on file    Forced sexual activity: Not on file  Other Topics Concern  . Not on file  Social History Narrative   Unable to ask intimate partner questions.    FAMILY HISTORY:  Family History    Problem Relation Age of Onset  . Breast cancer Mother   . Thyroid disease Mother        Thyroid removed  . Lung cancer Mother   . Crohn's disease Mother        ???  . Heart disease Father   . Stroke Father   . Hyperlipidemia Father   . AAA (abdominal aortic aneurysm) Father   . Valvular heart disease Brother        Bicuspid aortic valve  . Lung cancer Maternal Aunt   . Colon cancer Neg Hx     CURRENT MEDICATIONS:  Outpatient Encounter Medications as of 08/29/2018  Medication Sig  . acetaminophen (TYLENOL) 500 MG tablet Take 500 mg by mouth every 6 (six) hours as needed for moderate pain or headache.   . Ascorbic Acid (VITAMIN C) 1000 MG tablet Take 1,000 mg by mouth daily.  Marland Kitchen b complex vitamins capsule Take 1 capsule by mouth daily.  . capecitabine (XELODA) 500 MG tablet Take 4 tablets (2,000 mg total) by mouth 2 (two) times daily after a meal. Take on days 1-14 of chemotherapy. Repeat every 21 days.  . cetirizine (ZYRTEC) 10 MG tablet Take 10 mg by mouth daily as needed for allergies.   . diazepam (VALIUM) 5 MG tablet Take 2.5 mg by mouth at bedtime.   . diphenhydrAMINE (BENADRYL) 25 mg capsule Take 25 mg by mouth daily as needed for allergies.   . fluticasone (FLONASE) 50 MCG/ACT nasal spray Place 2 sprays into the nose daily. (Patient taking differently: Place 2 sprays into the nose daily as needed (for seasonal allergies.). )  . hydrochlorothiazide (MICROZIDE) 12.5 MG capsule Take 12.5 mg by mouth daily.  Marland Kitchen ibuprofen (ADVIL,MOTRIN) 400 MG tablet Take 1-2 tablets (400-800 mg total) by mouth every 6 (six) hours as needed for fever, headache, mild pain or moderate pain.  Marland Kitchen lidocaine-prilocaine (EMLA) cream Apply small amount over port site and cover with plastic wrap one hour prior to appointment.  Marland Kitchen losartan (COZAAR) 100 MG tablet Take 100 mg by mouth daily.   . Magnesium 250 MG TABS Take 250 mg by mouth 2 (two) times daily.   . methimazole (TAPAZOLE) 5 MG tablet Take 5 mg by  mouth daily.   . Multiple Vitamin (MULTIVITAMIN WITH MINERALS) TABS tablet Take 1 tablet by mouth daily. Men's One-A-Day  . ondansetron (ZOFRAN) 8 MG tablet Take 1 tablet (8 mg total) by mouth every 8 (eight) hours as needed for nausea or vomiting.  . OXALIPLATIN IV Inject into the vein.  . pantoprazole (PROTONIX) 40 MG tablet TAKE ONE TABLET BY MOUTH TWICE DAILY BEFORE A MEAL (Patient  taking differently: Take 40 mg by mouth daily. )  . prochlorperazine (COMPAZINE) 10 MG tablet Take 1 tablet (10 mg total) by mouth every 6 (six) hours as needed (Nausea or vomiting).  . simvastatin (ZOCOR) 40 MG tablet Take 40 mg by mouth every evening.    No facility-administered encounter medications on file as of 08/29/2018.     ALLERGIES:  No Known Allergies   PHYSICAL EXAM:  ECOG Performance status: ***  There were no vitals filed for this visit. There were no vitals filed for this visit.  Physical Exam   LABORATORY DATA:  I have reviewed the labs as listed.  CBC    Component Value Date/Time   WBC 5.0 08/10/2018 0453   RBC 4.11 (L) 08/10/2018 0453   HGB 12.7 (L) 08/10/2018 0453   HCT 37.7 (L) 08/10/2018 0453   PLT 251 08/10/2018 0453   MCV 91.7 08/10/2018 0453   MCH 30.9 08/10/2018 0453   MCHC 33.7 08/10/2018 0453   RDW 12.6 08/10/2018 0453   LYMPHSABS 0.7 06/29/2012 1539   MONOABS 0.4 06/29/2012 1539   EOSABS 0.0 06/29/2012 1539   BASOSABS 0.0 06/29/2012 1539   CMP Latest Ref Rng & Units 08/10/2018 08/09/2018 08/08/2018  Glucose 70 - 99 mg/dL 104(H) 98 118(H)  BUN 6 - 20 mg/dL 14 11 8   Creatinine 0.61 - 1.24 mg/dL 1.01 0.99 1.12  Sodium 135 - 145 mmol/L 143 141 140  Potassium 3.5 - 5.1 mmol/L 4.4 3.8 4.0  Chloride 98 - 111 mmol/L 109 107 106  CO2 22 - 32 mmol/L 28 27 25   Calcium 8.9 - 10.3 mg/dL 9.2 8.9 8.6(L)  Total Protein 6.5 - 8.1 g/dL - - -  Total Bilirubin 0.3 - 1.2 mg/dL - - -  Alkaline Phos 38 - 126 U/L - - -  AST 15 - 41 U/L - - -  ALT 0 - 44 U/L - - -        DIAGNOSTIC IMAGING:  *The following radiologic images and reports have been reviewed independently and agree with below findings.      ASSESSMENT & PLAN:   No problem-specific Assessment & Plan notes found for this encounter.      Orders placed this encounter:  No orders of the defined types were placed in this encounter.     Derek Jack, MD [place] [phone]

## 2018-08-29 NOTE — Progress Notes (Signed)
Tolerated infusion w/o adverse reaction.  Alert, in no distress.  VSS.  Discharged ambulatory in c/o spouse.  

## 2018-08-29 NOTE — Progress Notes (Signed)
Late entry: 0915 Chemotherapy teaching done today at bedside. Patient was given extensive teaching packet. Side effects and how to manage side effects discussed with patient. Discussed food and drug interactions. Discussed reproductive warnings and precautions to take when sexually active.  Discussed when to call the doctor. Stressed the importance of fevers.  Importance of hand hygiene and infection prevention reviewed.  Discussed hydration, mouth care and skin care.  Nausea, vomiting and diarrhea sheets given to patient. How to reach clinic after hours discussed with patient.  Phone numbers to nurse navigator and clinic provided.  Patient and significant other were given opportunity to ask questions and all were answered to their satisfaction.  Consent was signed for treatment today.

## 2018-08-30 ENCOUNTER — Other Ambulatory Visit (HOSPITAL_COMMUNITY): Payer: Self-pay | Admitting: Nurse Practitioner

## 2018-08-30 ENCOUNTER — Telehealth (HOSPITAL_COMMUNITY): Payer: Self-pay

## 2018-08-30 ENCOUNTER — Telehealth (HOSPITAL_COMMUNITY): Payer: Self-pay | Admitting: *Deleted

## 2018-08-30 DIAGNOSIS — C19 Malignant neoplasm of rectosigmoid junction: Secondary | ICD-10-CM

## 2018-08-30 MED ORDER — LORAZEPAM 1 MG PO TABS: 1.0000 mg | ORAL_TABLET | Freq: Three times a day (TID) | ORAL | 0 refills | Status: DC | PRN

## 2018-08-30 NOTE — Telephone Encounter (Signed)
Follow up phone call for triage call last evening.   Patient stated he felt a "little better" but the zofran and compazine were not helping, tingling all over, ache/cramping, and his eyes are better when he closes one eye but feels like they are "drifting" at times.  He is drinking Gator aid and water and trying to eat toast this morning.  He is also having "hot and cold flashes" this morning and palpitations.  Denied diarrhea.    Reviewed symptoms with Dr. Delton Coombes with verbal order Lorazepam 1mg  BID PRN and may take half dose if needed instead.  Offered hydration with IV Zofran but patient would like to stay home and continue drinking fluids and eating toast.  Instructed the patient to take full dose of Xeloda but take nausea medication 20 minutes before Xeloda per Dr. Delton Coombes.  Reviewed all nausea medications with the patient. Also, instructed the patient to call if he feels he needs hydration.  Patient verbalized understanding.

## 2018-08-31 ENCOUNTER — Encounter (HOSPITAL_COMMUNITY): Payer: Self-pay

## 2018-09-03 ENCOUNTER — Other Ambulatory Visit (HOSPITAL_COMMUNITY): Payer: Self-pay

## 2018-09-03 ENCOUNTER — Encounter (HOSPITAL_COMMUNITY): Payer: Self-pay | Admitting: *Deleted

## 2018-09-04 ENCOUNTER — Other Ambulatory Visit (HOSPITAL_COMMUNITY): Payer: Self-pay | Admitting: Hematology

## 2018-09-10 ENCOUNTER — Ambulatory Visit (HOSPITAL_COMMUNITY): Payer: Self-pay | Admitting: Hematology

## 2018-09-13 ENCOUNTER — Ambulatory Visit (HOSPITAL_COMMUNITY): Payer: Self-pay | Admitting: Hematology

## 2018-09-14 ENCOUNTER — Other Ambulatory Visit (HOSPITAL_COMMUNITY): Payer: Self-pay

## 2018-09-14 DIAGNOSIS — C20 Malignant neoplasm of rectum: Secondary | ICD-10-CM

## 2018-09-14 DIAGNOSIS — C19 Malignant neoplasm of rectosigmoid junction: Secondary | ICD-10-CM

## 2018-09-19 ENCOUNTER — Inpatient Hospital Stay (HOSPITAL_BASED_OUTPATIENT_CLINIC_OR_DEPARTMENT_OTHER): Payer: Commercial Managed Care - PPO | Admitting: Hematology

## 2018-09-19 ENCOUNTER — Inpatient Hospital Stay (HOSPITAL_COMMUNITY): Payer: Commercial Managed Care - PPO

## 2018-09-19 ENCOUNTER — Encounter (HOSPITAL_COMMUNITY): Payer: Self-pay | Admitting: Hematology

## 2018-09-19 ENCOUNTER — Other Ambulatory Visit: Payer: Self-pay

## 2018-09-19 VITALS — BP 131/75 | HR 85 | Temp 97.9°F | Resp 16 | Wt 210.6 lb

## 2018-09-19 DIAGNOSIS — C19 Malignant neoplasm of rectosigmoid junction: Secondary | ICD-10-CM

## 2018-09-19 DIAGNOSIS — D759 Disease of blood and blood-forming organs, unspecified: Secondary | ICD-10-CM

## 2018-09-19 DIAGNOSIS — Z801 Family history of malignant neoplasm of trachea, bronchus and lung: Secondary | ICD-10-CM | POA: Diagnosis not present

## 2018-09-19 DIAGNOSIS — R200 Anesthesia of skin: Secondary | ICD-10-CM | POA: Diagnosis not present

## 2018-09-19 DIAGNOSIS — Z5111 Encounter for antineoplastic chemotherapy: Secondary | ICD-10-CM | POA: Diagnosis not present

## 2018-09-19 DIAGNOSIS — Z803 Family history of malignant neoplasm of breast: Secondary | ICD-10-CM

## 2018-09-19 DIAGNOSIS — C20 Malignant neoplasm of rectum: Secondary | ICD-10-CM

## 2018-09-19 LAB — CBC WITH DIFFERENTIAL/PLATELET
Abs Immature Granulocytes: 0.01 10*3/uL (ref 0.00–0.07)
Basophils Absolute: 0.1 10*3/uL (ref 0.0–0.1)
Basophils Relative: 2 %
EOS ABS: 0.2 10*3/uL (ref 0.0–0.5)
EOS PCT: 5 %
HEMATOCRIT: 41.8 % (ref 39.0–52.0)
Hemoglobin: 14 g/dL (ref 13.0–17.0)
Immature Granulocytes: 0 %
LYMPHS ABS: 1.1 10*3/uL (ref 0.7–4.0)
Lymphocytes Relative: 34 %
MCH: 30.7 pg (ref 26.0–34.0)
MCHC: 33.5 g/dL (ref 30.0–36.0)
MCV: 91.7 fL (ref 80.0–100.0)
MONOS PCT: 17 %
Monocytes Absolute: 0.6 10*3/uL (ref 0.1–1.0)
NRBC: 0 % (ref 0.0–0.2)
Neutro Abs: 1.3 10*3/uL — ABNORMAL LOW (ref 1.7–7.7)
Neutrophils Relative %: 42 %
Platelets: 263 10*3/uL (ref 150–400)
RBC: 4.56 MIL/uL (ref 4.22–5.81)
RDW: 13.7 % (ref 11.5–15.5)
WBC: 3.2 10*3/uL — ABNORMAL LOW (ref 4.0–10.5)

## 2018-09-19 LAB — COMPREHENSIVE METABOLIC PANEL
ALT: 23 U/L (ref 0–44)
ANION GAP: 9 (ref 5–15)
AST: 21 U/L (ref 15–41)
Albumin: 4.2 g/dL (ref 3.5–5.0)
Alkaline Phosphatase: 71 U/L (ref 38–126)
BILIRUBIN TOTAL: 0.4 mg/dL (ref 0.3–1.2)
BUN: 13 mg/dL (ref 6–20)
CO2: 25 mmol/L (ref 22–32)
Calcium: 9.3 mg/dL (ref 8.9–10.3)
Chloride: 104 mmol/L (ref 98–111)
Creatinine, Ser: 0.88 mg/dL (ref 0.61–1.24)
Glucose, Bld: 122 mg/dL — ABNORMAL HIGH (ref 70–99)
POTASSIUM: 3.6 mmol/L (ref 3.5–5.1)
Sodium: 138 mmol/L (ref 135–145)
TOTAL PROTEIN: 7.5 g/dL (ref 6.5–8.1)

## 2018-09-19 NOTE — Assessment & Plan Note (Signed)
1.  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 - Colonoscopy on 07/05/2018 shows a 1 mass, biopsy consistent with invasive moderately differentiated adenocarcinoma. - CT CAP on 07/10/2018 showing mass within the rectosigmoid colon, no evidence of nodal or distant metastatic disease. - MRI of the pelvis rectal protocol on 07/16/2018 shows a 4.2 cm polypoid lesion along the right lateral aspect of the upper rectum, 14 cm from anal sphincter, T stage: T1 or T2, N0. - Robotic low anterior resection on 08/07/2018 - Pathology showing invasive moderately differentiated colorectal adenocarcinoma, 3.5 cm, carcinoma focally involving pericolonic connective tissue, 1/15 lymph nodes positive, margins negative - His case was discussed at the GI tumor board by Dr. Marcello Moores and others.  It was felt to be a sigmoid colon cancer rather than rectal cancer due to its relation with the peritoneal reflection.  Radiation oncology felt that he does not need radiation. - Hence he was started on XELOX cycle 1 on 08/29/2018.  He took Xeloda 2 g twice daily 2 weeks on 1 week off. -He did experience cold sensitivity throughout the last 3 weeks.  He also had diplopia lasting couple of days after last treatment. -We discussed the results from [facility] study, which showed 3 months of Xeloda and oxaliplatin has shown similar disease-free survival and significantly lower rates of oxaliplatin neuropathy compared to 6 months of treatment.  However overall survival results are not yet mature from the [facility].  We will see how he tolerates oxaliplatin down the line.  If he does not tolerate well, we will plan to cut short his treatment to 3 months. - We reviewed the blood counts today which showed ANC of 1300.  Hence we will delay his treatment by 1 week.  If he continues to have problems with cytopenias, we will consider  giving growth factor support in the form of Neupogen.  We will recheck his counts next week.  2.  Family history: - Mother had breast cancer.  She later developed lung cancer and was a smoker.  Maternal aunt also had lung cancer. -Based on his young age her diagnosis, I would recommend genetic testing.

## 2018-09-19 NOTE — Patient Instructions (Signed)
[facility] at [facility] Discharge Instructions     Thank you for choosing [facility] at [facility] to provide your oncology and hematology care.  To afford each patient quality time with our provider, please arrive at least 15 minutes before your scheduled appointment time.   If you have a lab appointment with the Cancer Center please come in thru the  Main Entrance and check in at the main information desk  You need to re-schedule your appointment should you arrive 10 or more minutes late.  We strive to give you quality time with our providers, and arriving late affects you and other patients whose appointments are after yours.  Also, if you no show three or more times for appointments you may be dismissed from the clinic at the providers discretion.     Again, thank you for choosing [facility].  Our hope is that these requests will decrease the amount of time that you wait before being seen by our physicians.       _____________________________________________________________  Should you have questions after your visit to [facility], please contact our office at [phone] between the hours of 8:00 a.m. and 4:30 p.m.  Voicemails left after 4:00 p.m. will not be returned until the following business day.  For prescription refill requests, have your pharmacy contact our office and allow 72 hours.    Cancer Center Support Programs:   > Cancer Support Group  2nd Tuesday of the month 1pm-2pm, Journey Room    

## 2018-09-19 NOTE — Progress Notes (Signed)
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       INTERVAL HISTORY:  Jacob Hayes 37 y.o. male returns for routine follow-up for colon cancer. His neck pain is better and he now has full range of motion to his neck. Patient is experiencing sensitivity to cold and numbness and tingling. He also had sores on the side of his tongue for 4 days after his treatment. He had double vision immediately after treatment for 3 days with nausea and vomiting. He denies any new pains. Denies any skin rashes. Denies any headaches. Denies any cough or SOB. He reports his appetite and energy level at 75% and he is maintaining his weight at this time.     REVIEW OF SYSTEMS:  Review of Systems  Constitutional:       Cold sensitivity  Double vision  HENT:   Positive for mouth sores.   Neurological: Positive for dizziness.  All other systems reviewed and are negative.    PAST MEDICAL/SURGICAL HISTORY:  Past Medical History:  Diagnosis Date  . Allergic rhinitis   . Anxiety   . Anxiety and depression    DENIES   . Bruxism   . Colon cancer ([place])   . Essential hypertension, benign   . GERD   . Hyperlipidemia    Since age 40  . Meniere's disease    Past Surgical History:  Procedure  Laterality Date  . BIOPSY N/A 03/20/2013   Procedure: BIOPSY;  Surgeon: [name], MD;  [facility];  Service: Endoscopy;  Laterality: N/A;  Possible small bowel biopsy  . BIOPSY  07/05/2018   Procedure: BIOPSY;  Surgeon: [name], MD;  Location: [facility];  Service: Endoscopy;;  rectal mass  . COLONOSCOPY WITH PROPOFOL N/A 07/05/2018   Procedure: COLONOSCOPY WITH PROPOFOL;  Surgeon: [name], MD;  Location: [facility];  Service: Endoscopy;  Laterality: N/A;  8:30am  . ESOPHAGOGASTRODUODENOSCOPY N/A 03/20/2013   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  One slightly nodular.  Small hiatal hernia.  Multiple fundic gland appearing polyps. no evidence of celiac disease, malignancy, h.pylori, barrett's.   . None    . POLYPECTOMY  07/05/2018   Procedure: POLYPECTOMY;  Surgeon: [name], MD;  Location: [facility];  Service: Endoscopy;;  hepatic flexurex2;  . PORTACATH PLACEMENT N/A 08/23/2018   Procedure: INSERTION PORT-A-CATH;  Surgeon: [name], MD;  Location: [facility];  Service: General;  Laterality: N/A;  LMA  . XI ROBOTIC ASSISTED LOWER ANTERIOR RESECTION       SOCIAL HISTORY:  Social History   Socioeconomic History  . Marital status: Divorced    Spouse name:  Not on file  . Number of children: 2  . Years of education: Not on file  . Highest education level: Not on file  Occupational History  . Occupation: works with Sales executive at [employer]  . Financial resource strain: Not on file  . Food insecurity:    Worry: Not on file    Inability: Not on file  . Transportation needs:    Medical: Not on file    Non-medical: Not on file  Tobacco Use  . Smoking status: Never Smoker  . Smokeless tobacco: Never Used  Substance and Sexual Activity  . Alcohol use: No  . Drug use: No  . Sexual activity: Yes  Lifestyle  .  Physical activity:    Days per week: Not on file    Minutes per session: Not on file  . Stress: Not on file  Relationships  . Social connections:    Talks on phone: Not on file    Gets together: Not on file    Attends religious service: Not on file    Active member of club or organization: Not on file    Attends meetings of clubs or organizations: Not on file    Relationship status: Not on file  . Intimate partner violence:    Fear of current or ex partner: Not on file    Emotionally abused: Not on file    Physically abused: Not on file    Forced sexual activity: Not on file  Other Topics Concern  . Not on file  Social History Narrative   Unable to ask intimate partner questions.    FAMILY HISTORY:  Family History  Problem Relation Age of Onset  . Breast cancer Mother   . Thyroid disease Mother        Thyroid removed  . Lung cancer Mother   . Crohn's disease Mother        ???  . Heart disease Father   . Stroke Father   . Hyperlipidemia Father   . AAA (abdominal aortic aneurysm) Father   . Valvular heart disease Brother        Bicuspid aortic valve  . Lung cancer Maternal Aunt   . Colon cancer Neg Hx     CURRENT MEDICATIONS:  Outpatient Encounter Medications as of 09/19/2018  Medication Sig  . acetaminophen (TYLENOL) 500 MG tablet Take 500 mg by mouth every 6 (six) hours as needed for moderate pain or headache.   . Ascorbic Acid (VITAMIN C) 1000 MG tablet Take 1,000 mg by mouth daily.  Marland Kitchen b complex vitamins capsule Take 1 capsule by mouth daily.  . capecitabine (XELODA) 500 MG tablet Take 4 tablets (2,000 mg total) by mouth 2 (two) times daily after a meal. Take on days 1-14 of chemotherapy. Repeat every 21 days.  . cetirizine (ZYRTEC) 10 MG tablet Take 10 mg by mouth daily as needed for allergies.   . diazepam (VALIUM) 5 MG tablet Take 2.5 mg by mouth at bedtime.   . diphenhydrAMINE (BENADRYL) 25 mg capsule Take 25 mg by mouth daily as needed for allergies.   .  fluticasone (FLONASE) 50 MCG/ACT nasal spray Place 2 sprays into the nose daily. (Patient taking differently: Place 2 sprays into the nose daily as needed (for seasonal allergies.). )  . hydrochlorothiazide (MICROZIDE) 12.5 MG capsule Take 12.5 mg by mouth daily.  Marland Kitchen ibuprofen (ADVIL,MOTRIN) 400 MG tablet Take 1-2 tablets (400-800 mg total) by mouth every 6 (six) hours as needed for  fever, headache, mild pain or moderate pain.  Marland Kitchen lidocaine-prilocaine (EMLA) cream Apply small amount over port site and cover with plastic wrap one hour prior to appointment.  Marland Kitchen LORazepam (ATIVAN) 1 MG tablet Take 1 tablet (1 mg total) by mouth every 8 (eight) hours as needed for anxiety.  Marland Kitchen losartan (COZAAR) 100 MG tablet Take 100 mg by mouth daily.   . Magnesium 250 MG TABS Take 250 mg by mouth 2 (two) times daily.   . methimazole (TAPAZOLE) 5 MG tablet Take 5 mg by mouth daily.   . Multiple Vitamin (MULTIVITAMIN WITH MINERALS) TABS tablet Take 1 tablet by mouth daily. Men's One-A-Day  . ondansetron (ZOFRAN) 8 MG tablet Take 1 tablet (8 mg total) by mouth every 8 (eight) hours as needed for nausea or vomiting.  . OXALIPLATIN IV Inject into the vein.  . pantoprazole (PROTONIX) 40 MG tablet TAKE ONE TABLET BY MOUTH TWICE DAILY BEFORE A MEAL (Patient taking differently: Take 40 mg by mouth daily. )  . prochlorperazine (COMPAZINE) 10 MG tablet Take 1 tablet (10 mg total) by mouth every 6 (six) hours as needed (Nausea or vomiting).  . simvastatin (ZOCOR) 40 MG tablet Take 40 mg by mouth every evening.    No facility-administered encounter medications on file as of 09/19/2018.     ALLERGIES:  No Known Allergies   PHYSICAL EXAM:  ECOG Performance status: 1  Vitals:   09/19/18 1055  BP: 131/75  Pulse: 85  Resp: 16  Temp: 97.9 F (36.6 C)  SpO2: 99%   Filed Weights   09/19/18 1055  Weight: 210 lb 9.6 oz (95.5 kg)    Physical Exam  Constitutional: He is oriented to person, place, and time. He appears  well-developed and well-nourished.  Cardiovascular: Normal rate, regular rhythm and normal heart sounds.  Pulmonary/Chest: Effort normal and breath sounds normal.  Abdominal: Soft.  Musculoskeletal: Normal range of motion.  Neurological: He is alert and oriented to person, place, and time.  Skin: Skin is warm and dry.  Psychiatric: He has a normal mood and affect. His behavior is normal. Judgment and thought content normal.  Abdomen: Soft nontender with no palpable organomegaly.   LABORATORY DATA:  I have reviewed the labs as listed.  CBC    Component Value Date/Time   WBC 3.2 (L) 09/19/2018 0943   RBC 4.56 09/19/2018 0943   HGB 14.0 09/19/2018 0943   HCT 41.8 09/19/2018 0943   PLT 263 09/19/2018 0943   MCV 91.7 09/19/2018 0943   MCH 30.7 09/19/2018 0943   MCHC 33.5 09/19/2018 0943   RDW 13.7 09/19/2018 0943   LYMPHSABS 1.1 09/19/2018 0943   MONOABS 0.6 09/19/2018 0943   EOSABS 0.2 09/19/2018 0943   BASOSABS 0.1 09/19/2018 0943   CMP Latest Ref Rng & Units 09/19/2018 08/29/2018 08/10/2018  Glucose 70 - 99 mg/dL 122(H) 120(H) 104(H)  BUN 6 - 20 mg/dL _0 Creatinine 0.61 - 1.24 mg/dL 0.88 1.09 1.01  Sodium 135 - 145 mmol/L 138 138 143  Potassium 3.5 - 5.1 mmol/L 3.6 3.7 4.4  Chloride 98 - 111 mmol/L 104 103 109  CO2 22 - 32 mmol/L _1 Calcium 8.9 - 10.3 mg/dL 9.3 9.2 9.2  Total Protein 6.5 - 8.1 g/dL 7.5 7.3 -  Total Bilirubin 0.3 - 1.2 mg/dL 0.4 0.4 -  Alkaline Phos 38 - 126 U/L 71 71 -  AST 15 - 41 U/L 21 17 -  ALT 0 - 44 U/L  23 14 -        ASSESSMENT & PLAN:   Cancer of sigmoid colon ([place]) 1.  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 - Colonoscopy on 07/05/2018 shows a 1 mass, biopsy consistent with invasive moderately differentiated adenocarcinoma. - CT CAP on 07/10/2018 showing mass within the rectosigmoid colon, no evidence of  nodal or distant metastatic disease. - MRI of the pelvis rectal protocol on 07/16/2018 shows a 4.2 cm polypoid lesion along the right lateral aspect of the upper rectum, 14 cm from anal sphincter, T stage: T1 or T2, N0. - Robotic low anterior resection on 08/07/2018 - Pathology showing invasive moderately differentiated colorectal adenocarcinoma, 3.5 cm, carcinoma focally involving pericolonic connective tissue, 1/15 lymph nodes positive, margins negative - His case was discussed at the GI tumor board by Dr. Marcello Moores and others.  It was felt to be a sigmoid colon cancer rather than rectal cancer due to its relation with the peritoneal reflection.  Radiation oncology felt that he does not need radiation. - Hence he was started on XELOX cycle 1 on 08/29/2018.  He took Xeloda 2 g twice daily 2 weeks on 1 week off. -He did experience cold sensitivity throughout the last 3 weeks.  He also had diplopia lasting couple of days after last treatment. -We discussed the results from IDEA study, which showed 3 months of Xeloda and oxaliplatin has shown similar disease-free survival and significantly lower rates of oxaliplatin neuropathy compared to 6 months of treatment.  However overall survival results are not yet mature from the [employer].  We will see how he tolerates oxaliplatin down the line.  If he does not tolerate well, we will plan to cut short his treatment to 3 months. - We reviewed the blood counts today which showed ANC of 1300.  Hence we will delay his treatment by 1 week.  If he continues to have problems with cytopenias, we will consider giving growth factor support in the form of Neupogen.  We will recheck his counts next week.  2.  Family history: - Mother had breast cancer.  She later developed lung cancer and was a smoker.  Maternal aunt also had lung cancer. -Based on his young age her diagnosis, I would recommend genetic testing.       Orders placed this encounter:  Orders Placed This  Encounter  Procedures  . CEA  . CBC with Differential/Platelet  . Comprehensive metabolic panel      Derek Jack, MD [place] [phone]

## 2018-09-19 NOTE — Progress Notes (Signed)
No tx today per Dr. Delton Coombes d/t [place] 1300.

## 2018-09-21 ENCOUNTER — Other Ambulatory Visit (HOSPITAL_COMMUNITY): Payer: Self-pay

## 2018-09-21 DIAGNOSIS — C19 Malignant neoplasm of rectosigmoid junction: Secondary | ICD-10-CM

## 2018-09-26 ENCOUNTER — Other Ambulatory Visit (HOSPITAL_COMMUNITY): Payer: Self-pay | Admitting: Hematology

## 2018-09-26 ENCOUNTER — Inpatient Hospital Stay (HOSPITAL_COMMUNITY): Payer: Commercial Managed Care - PPO | Attending: Hematology

## 2018-09-26 DIAGNOSIS — C19 Malignant neoplasm of rectosigmoid junction: Secondary | ICD-10-CM | POA: Insufficient documentation

## 2018-09-26 DIAGNOSIS — Z79899 Other long term (current) drug therapy: Secondary | ICD-10-CM | POA: Diagnosis not present

## 2018-09-26 DIAGNOSIS — Z5111 Encounter for antineoplastic chemotherapy: Secondary | ICD-10-CM | POA: Diagnosis not present

## 2018-09-26 LAB — COMPREHENSIVE METABOLIC PANEL
ALBUMIN: 4 g/dL (ref 3.5–5.0)
ALT: 52 U/L — ABNORMAL HIGH (ref 0–44)
ANION GAP: 7 (ref 5–15)
AST: 34 U/L (ref 15–41)
Alkaline Phosphatase: 74 U/L (ref 38–126)
BILIRUBIN TOTAL: 0.7 mg/dL (ref 0.3–1.2)
BUN: 13 mg/dL (ref 6–20)
CHLORIDE: 105 mmol/L (ref 98–111)
CO2: 26 mmol/L (ref 22–32)
Calcium: 8.9 mg/dL (ref 8.9–10.3)
Creatinine, Ser: 0.93 mg/dL (ref 0.61–1.24)
GFR calc Af Amer: >60 mL/min (ref >60)
GFR calc non Af Amer: >60 mL/min (ref >60)
Glucose, Bld: 136 mg/dL — ABNORMAL HIGH (ref 70–99)
POTASSIUM: 3.4 mmol/L — AB (ref 3.5–5.1)
SODIUM: 138 mmol/L (ref 135–145)
Total Protein: 7.1 g/dL (ref 6.5–8.1)

## 2018-09-26 LAB — CBC WITH DIFFERENTIAL/PLATELET
ABS IMMATURE GRANULOCYTES: 0.01 10*3/uL (ref 0.00–0.07)
BASOS ABS: 0.1 10*3/uL (ref 0.0–0.1)
BASOS PCT: 1 %
Eosinophils Absolute: 0.2 10*3/uL (ref 0.0–0.5)
Eosinophils Relative: 4 %
HCT: 40.9 % (ref 39.0–52.0)
Hemoglobin: 13.7 g/dL (ref 13.0–17.0)
IMMATURE GRANULOCYTES: 0 %
Lymphocytes Relative: 25 %
Lymphs Abs: 1.2 10*3/uL (ref 0.7–4.0)
MCH: 30.5 pg (ref 26.0–34.0)
MCHC: 33.5 g/dL (ref 30.0–36.0)
MCV: 91.1 fL (ref 80.0–100.0)
MONO ABS: 0.4 10*3/uL (ref 0.1–1.0)
Monocytes Relative: 8 %
NEUTROS ABS: 3.1 10*3/uL (ref 1.7–7.7)
NEUTROS PCT: 62 %
NRBC: 0 % (ref 0.0–0.2)
PLATELETS: 223 10*3/uL (ref 150–400)
RBC: 4.49 MIL/uL (ref 4.22–5.81)
RDW: 13.9 % (ref 11.5–15.5)
WBC: 4.9 10*3/uL (ref 4.0–10.5)

## 2018-09-27 ENCOUNTER — Other Ambulatory Visit (HOSPITAL_COMMUNITY): Payer: Self-pay

## 2018-09-27 ENCOUNTER — Encounter (HOSPITAL_COMMUNITY): Payer: Self-pay | Admitting: *Deleted

## 2018-09-28 ENCOUNTER — Inpatient Hospital Stay (HOSPITAL_COMMUNITY): Payer: Commercial Managed Care - PPO

## 2018-09-28 ENCOUNTER — Encounter (HOSPITAL_COMMUNITY): Payer: Self-pay

## 2018-09-28 VITALS — BP 123/73 | HR 83 | Temp 98.2°F | Resp 18 | Wt 211.2 lb

## 2018-09-28 DIAGNOSIS — C187 Malignant neoplasm of sigmoid colon: Secondary | ICD-10-CM

## 2018-09-28 DIAGNOSIS — Z5111 Encounter for antineoplastic chemotherapy: Secondary | ICD-10-CM | POA: Diagnosis not present

## 2018-09-28 MED ORDER — SODIUM CHLORIDE 0.9% FLUSH
10.0000 mL | INTRAVENOUS | Status: DC | PRN
  Administered 2018-09-28: 10 mL
  Filled 2018-09-28: qty 10

## 2018-09-28 MED ORDER — DEXTROSE 5 % IV SOLN
Freq: Once | INTRAVENOUS | Status: AC
  Administered 2018-09-28: 10:00:00 via INTRAVENOUS

## 2018-09-28 MED ORDER — PALONOSETRON HCL INJECTION 0.25 MG/5ML
0.2500 mg | Freq: Once | INTRAVENOUS | Status: AC
  Administered 2018-09-28: 0.25 mg via INTRAVENOUS
  Filled 2018-09-28: qty 5

## 2018-09-28 MED ORDER — OXALIPLATIN CHEMO INJECTION 100 MG/20ML
133.0000 mg/m2 | Freq: Once | INTRAVENOUS | Status: AC
  Administered 2018-09-28: 300 mg via INTRAVENOUS
  Filled 2018-09-28: qty 60

## 2018-09-28 MED ORDER — SODIUM CHLORIDE 0.9 % IV SOLN
10.0000 mg | Freq: Once | INTRAVENOUS | Status: AC
  Administered 2018-09-28: 10 mg via INTRAVENOUS
  Filled 2018-09-28: qty 1

## 2018-09-28 MED ORDER — HEPARIN SOD (PORK) LOCK FLUSH 100 UNIT/ML IV SOLN
500.0000 [IU] | Freq: Once | INTRAVENOUS | Status: AC | PRN
  Administered 2018-09-28: 500 [IU]

## 2018-09-28 NOTE — Patient Instructions (Signed)
[facility] Discharge Instructions for Patients Receiving Chemotherapy   Beginning January 23rd 2017 lab work for the [facility] will be done in the  Main lab at [facility] on 1st floor. If you have a lab appointment with the [facility] please come in thru the  Main Entrance and check in at the main information desk   Today you received the following chemotherapy agents Oxaliplatin. Follow-up as scheduled. Call clinic for any questions or concerns  To help prevent nausea and vomiting after your treatment, we encourage you to take your nausea medication   If you develop nausea and vomiting, or diarrhea that is not controlled by your medication, call the clinic.  The clinic phone number is [phone]. Office hours are Monday-Friday 8:30am-5:00pm.  BELOW ARE SYMPTOMS THAT SHOULD BE REPORTED IMMEDIATELY:  *FEVER GREATER THAN 101.0 F  *CHILLS WITH OR WITHOUT FEVER  NAUSEA AND VOMITING THAT IS NOT CONTROLLED WITH YOUR NAUSEA MEDICATION  *UNUSUAL SHORTNESS OF BREATH  *UNUSUAL BRUISING OR BLEEDING  TENDERNESS IN MOUTH AND THROAT WITH OR WITHOUT PRESENCE OF ULCERS  *URINARY PROBLEMS  *BOWEL PROBLEMS  UNUSUAL RASH Items with * indicate a potential emergency and should be followed up as soon as possible. If you have an emergency after office hours please contact your primary care physician or go to the nearest emergency department.  Please call the clinic during office hours if you have any questions or concerns.   You may also contact the Patient Navigator at [phone] should you have any questions or need assistance in obtaining follow up care.      Resources For Cancer Patients and their Caregivers ? American Cancer Society: Can assist with transportation, wigs, general needs, runs Look Good Feel Better.        ([phone] [phone] ? Cancer Care: Provides financial assistance, online support groups, medication/co-pay assistance.  1-800-813-HOPE  (660) 381-2338) ? [place] Assists [place] cancer patients and their families through emotional , educational and financial support.  (517)319-1698 ? [place] DSS Where to apply for food stamps, Medicaid and utility assistance. 647-389-3744 ? [facility]: Transportation to medical appointments. 825-837-3741 ? Social Security Administration: May apply for disability if have a Stage IV cancer. 4062317158 (805)346-5682 ? [employer], Disability and Transit Services: Assists with nutrition, care and transit needs. 804-739-5441

## 2018-09-28 NOTE — Progress Notes (Signed)
Jacob Hayes tolerated Oxaliplatin infusion well without complaints or incident. Labs reviewed prior to administering this medication.Pt continues to take Xeloda as prescribed without issues. VSS upon discharge. Pt discharged self ambulatory in satisfactory condition accompanied by family member

## 2018-09-29 ENCOUNTER — Encounter: Payer: Self-pay | Admitting: Gastroenterology

## 2018-10-01 ENCOUNTER — Encounter (HOSPITAL_COMMUNITY): Payer: Self-pay

## 2018-10-04 ENCOUNTER — Telehealth (HOSPITAL_COMMUNITY): Payer: Self-pay | Admitting: *Deleted

## 2018-10-04 NOTE — Telephone Encounter (Signed)
My chart message sent to patient to call clinic and make an appointment.

## 2018-10-16 ENCOUNTER — Encounter (HOSPITAL_COMMUNITY): Payer: Self-pay | Admitting: Hematology

## 2018-10-16 ENCOUNTER — Other Ambulatory Visit: Payer: Self-pay | Admitting: *Deleted

## 2018-10-16 ENCOUNTER — Inpatient Hospital Stay (HOSPITAL_BASED_OUTPATIENT_CLINIC_OR_DEPARTMENT_OTHER): Payer: Commercial Managed Care - PPO | Admitting: Hematology

## 2018-10-16 VITALS — BP 144/75 | HR 76 | Temp 97.7°F | Resp 18 | Wt 213.0 lb

## 2018-10-16 DIAGNOSIS — R5383 Other fatigue: Secondary | ICD-10-CM

## 2018-10-16 DIAGNOSIS — C19 Malignant neoplasm of rectosigmoid junction: Secondary | ICD-10-CM | POA: Diagnosis not present

## 2018-10-16 DIAGNOSIS — R110 Nausea: Secondary | ICD-10-CM | POA: Diagnosis not present

## 2018-10-16 DIAGNOSIS — C779 Secondary and unspecified malignant neoplasm of lymph node, unspecified: Secondary | ICD-10-CM

## 2018-10-16 DIAGNOSIS — C187 Malignant neoplasm of sigmoid colon: Secondary | ICD-10-CM

## 2018-10-16 DIAGNOSIS — Z5111 Encounter for antineoplastic chemotherapy: Secondary | ICD-10-CM | POA: Diagnosis not present

## 2018-10-16 NOTE — Assessment & Plan Note (Addendum)
1.  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 - Colonoscopy on 07/05/2018 shows a 1 mass, biopsy consistent with invasive moderately differentiated adenocarcinoma. - CT CAP on 07/10/2018 showing mass within the rectosigmoid colon, no evidence of nodal or distant metastatic disease. - MRI of the pelvis rectal protocol on 07/16/2018 shows a 4.2 cm polypoid lesion along the right lateral aspect of the upper rectum, 14 cm from anal sphincter, T stage: T1 or T2, N0. - Robotic low anterior resection on 08/07/2018 - Pathology showing invasive moderately differentiated colorectal adenocarcinoma, 3.5 cm, carcinoma focally involving pericolonic connective tissue, 1/15 lymph nodes positive, margins negative - His case was discussed at the GI tumor board by Dr. Marcello Moores and others.  It was felt to be a sigmoid colon cancer rather than rectal cancer due to its relation with the peritoneal reflection.  Radiation oncology felt that he does not need radiation. - Hence he was started on XELOX cycle 1 on 08/29/2018.  He took Xeloda 2 g twice daily 2 weeks on 1 week off. -He did experience cold sensitivity throughout the last 3 weeks.  He also had diplopia lasting couple of days after last treatment. -We discussed the results from IDEA study, which showed 3 months of Xeloda and oxaliplatin has shown similar disease-free survival and significantly lower rates of oxaliplatin neuropathy compared to 6 months of treatment.  However overall survival results are not yet mature from the [employer].  We will see how he tolerates oxaliplatin down the line.  If he does not tolerate well, we will plan to cut short his treatment to 3 months. - He received cycle 2 on 09/28/2018.  After his cycle 2, he experienced cold sensitivity lasting at least 2-3 weeks.  He was also severely weak and reportedly was in bed for 3  days.  He also had episodes of nausea and was using Phenergan, Compazine and Zofran.  He also used lorazepam without much help.  He did throw up few times.  Denies any tingling or numbness in the extremities at this time. -He is also very concerned that if he develops neuropathy, it will affect his professional life.  He is a [employer] and requires fine motor skills. -We had a prolonged discussion about further plan of chemotherapy.  Because of his young age and node positive disease, I have recommended dose reduction of oxaliplatin and continuing at least 3 months of XELOX based on IDEA study.  If he does not want to continue oxaliplatin at all, I have recommended Xeloda to be continued until 6 months.  This will be followed by very close surveillance once every 3 months. - He would like to think about it and will come back next Tuesday for repeat blood work.  2.  Family history: - Mother had breast cancer.  She later developed lung cancer and was a smoker.  Maternal aunt also had lung cancer. -Based on his young age her diagnosis, I would recommend genetic testing.

## 2018-10-16 NOTE — Patient Instructions (Signed)
[facility] at [facility] Discharge Instructions     Thank you for choosing [facility] at [facility] to provide your oncology and hematology care.  To afford each patient quality time with our provider, please arrive at least 15 minutes before your scheduled appointment time.   If you have a lab appointment with the Cancer Center please come in thru the  Main Entrance and check in at the main information desk  You need to re-schedule your appointment should you arrive 10 or more minutes late.  We strive to give you quality time with our providers, and arriving late affects you and other patients whose appointments are after yours.  Also, if you no show three or more times for appointments you may be dismissed from the clinic at the providers discretion.     Again, thank you for choosing [facility].  Our hope is that these requests will decrease the amount of time that you wait before being seen by our physicians.       _____________________________________________________________  Should you have questions after your visit to [facility], please contact our office at [phone] between the hours of 8:00 a.m. and 4:30 p.m.  Voicemails left after 4:00 p.m. will not be returned until the following business day.  For prescription refill requests, have your pharmacy contact our office and allow 72 hours.    Cancer Center Support Programs:   > Cancer Support Group  2nd Tuesday of the month 1pm-2pm, Journey Room    

## 2018-10-16 NOTE — Progress Notes (Signed)
 [facility] [address] [place], [place] [address]    CLINIC:  Medical Oncology/Hematology  PCP:  Badger, Michael C, MD [address] [place] [address] [phone]   REASON FOR VISIT: Follow-up for rectosigmoid cancer  CURRENT THERAPY: Xeloda and oxaliplatin   BRIEF ONCOLOGIC HISTORY:    Cancer of sigmoid colon (HCC)   08/07/2018 Initial Diagnosis    Rectosigmoid cancer (HCC)    08/22/2018 -  Chemotherapy    The patient had palonosetron (ALOXI) injection 0.25 mg, 0.25 mg, Intravenous,  Once, 2 of 4 cycles Administration: 0.25 mg (08/29/2018), 0.25 mg (09/28/2018) oxaliplatin (ELOXATIN) 300 mg in dextrose 5 % 500 mL chemo infusion, 132 mg/m2 = 295 mg, Intravenous,  Once, 2 of 4 cycles Administration: 300 mg (08/29/2018), 300 mg (09/28/2018)  for chemotherapy treatment.        INTERVAL HISTORY:  Mr. Jacob Hayes 37 y.o. male returns for routine follow-up for rectosigmoid cancer. He is here today with his girlfriend. He is having side effects from the treatment. He is experiencing sensitivity to cold which last 3 weeks after treatment. He has fatigue and nausea and unable to work or do any activities and was in the bed for about a week after this last treatment. He denies any skin peeling or itching. Denies any fevers or recent infections. Denies any diarrhea. Denies any mouth sores. He reports his appetite at 25% and he is maintaining his weight well. His energy level is 75%.    REVIEW OF SYSTEMS:  Review of Systems  Gastrointestinal: Positive for nausea.  All other systems reviewed and are negative.    PAST MEDICAL/SURGICAL HISTORY:  Past Medical History:  Diagnosis Date  . Allergic rhinitis   . Anxiety   . Anxiety and depression    DENIES   . Bruxism   . Colon cancer (HCC)   . Essential hypertension, benign   . GERD   . Hyperlipidemia    Since age 13  . Meniere's disease    Past Surgical History:  Procedure Laterality Date  . BIOPSY N/A  03/20/2013   Procedure: BIOPSY;  Surgeon: Robert M Rourk, MD;  Location: [facility];  Service: Endoscopy;  Laterality: N/A;  Possible small bowel biopsy  . BIOPSY  07/05/2018   Procedure: BIOPSY;  Surgeon: Rourk, Robert M, MD;  Location: [facility];  Service: Endoscopy;;  rectal mass  . COLONOSCOPY WITH PROPOFOL N/A 07/05/2018   Procedure: COLONOSCOPY WITH PROPOFOL;  Surgeon: Rourk, Robert M, MD;  Location: [facility];  Service: Endoscopy;  Laterality: N/A;  8:30am  . ESOPHAGOGASTRODUODENOSCOPY N/A 03/20/2013   Dr. Rourk, in the upper esophagus, through the upper esophageal sphincter, multiple areas of salmon-colored epithelium consistent with inlet patches.  One slightly nodular.  Small hiatal hernia.  Multiple fundic gland appearing polyps. no evidence of celiac disease, malignancy, h.pylori, barrett's.   . None    . POLYPECTOMY  07/05/2018   Procedure: POLYPECTOMY;  Surgeon: Rourk, Robert M, MD;  Location: [facility];  Service: Endoscopy;;  hepatic flexurex2;  . PORTACATH PLACEMENT N/A 08/23/2018   Procedure: INSERTION PORT-A-CATH;  Surgeon: Thomas, Alicia, MD;  Location: [facility];  Service: General;  Laterality: N/A;  LMA  . XI ROBOTIC ASSISTED LOWER ANTERIOR RESECTION       SOCIAL HISTORY:  Social History   Socioeconomic History  . Marital status: Divorced    Spouse name: Not on file  . Number of children: 2  . Years of education: Not   on file  . Highest education level: Not on file  Occupational History  . Occupation: works with quality control at [employer]  Social Needs  . Financial resource strain: Not on file  . Food insecurity:    Worry: Not on file    Inability: Not on file  . Transportation needs:    Medical: Not on file    Non-medical: Not on file  Tobacco Use  . Smoking status: Never Smoker  . Smokeless tobacco: Never Used  Substance and Sexual Activity  . Alcohol use: No  . Drug use: No  . Sexual activity: Yes  Lifestyle  . Physical activity:    Days per  week: Not on file    Minutes per session: Not on file  . Stress: Not on file  Relationships  . Social connections:    Talks on phone: Not on file    Gets together: Not on file    Attends religious service: Not on file    Active member of club or organization: Not on file    Attends meetings of clubs or organizations: Not on file    Relationship status: Not on file  . Intimate partner violence:    Fear of current or ex partner: Not on file    Emotionally abused: Not on file    Physically abused: Not on file    Forced sexual activity: Not on file  Other Topics Concern  . Not on file  Social History Narrative   Unable to ask intimate partner questions.    FAMILY HISTORY:  Family History  Problem Relation Age of Onset  . Breast cancer Mother   . Thyroid disease Mother        Thyroid removed  . Lung cancer Mother   . Crohn's disease Mother        ???  . Heart disease Father   . Stroke Father   . Hyperlipidemia Father   . AAA (abdominal aortic aneurysm) Father   . Valvular heart disease Brother        Bicuspid aortic valve  . Lung cancer Maternal Aunt   . Colon cancer Neg Hx     CURRENT MEDICATIONS:  Outpatient Encounter Medications as of 10/16/2018  Medication Sig Note  . acetaminophen (TYLENOL) 500 MG tablet Take 500 mg by mouth every 6 (six) hours as needed for moderate pain or headache.    . Ascorbic Acid (VITAMIN C) 1000 MG tablet Take 1,000 mg by mouth daily.   . b complex vitamins capsule Take 1 capsule by mouth daily. 10/16/2018: Not during chemo  . capecitabine (XELODA) 500 MG tablet Take 4 tablets (2,000 mg total) by mouth 2 (two) times daily after a meal. Take on days 1-14 of chemotherapy. Repeat every 21 days.   . cetirizine (ZYRTEC) 10 MG tablet Take 10 mg by mouth daily as needed for allergies.    . diazepam (VALIUM) 5 MG tablet Take 2.5 mg by mouth at bedtime.    . diphenhydrAMINE (BENADRYL) 25 mg capsule Take 25 mg by mouth daily as needed for allergies.      . fluticasone (FLONASE) 50 MCG/ACT nasal spray Place 2 sprays into the nose daily. (Patient taking differently: Place 2 sprays into the nose daily as needed (for seasonal allergies.). )   . hydrochlorothiazide (MICROZIDE) 12.5 MG capsule Take 12.5 mg by mouth daily.   . ibuprofen (ADVIL,MOTRIN) 400 MG tablet Take 1-2 tablets (400-800 mg total) by mouth every 6 (six) hours as needed for fever,   headache, mild pain or moderate pain.   . lidocaine-prilocaine (EMLA) cream Apply small amount over port site and cover with plastic wrap one hour prior to appointment.   . LORazepam (ATIVAN) 1 MG tablet Take 1 tablet (1 mg total) by mouth every 8 (eight) hours as needed for anxiety.   . losartan (COZAAR) 100 MG tablet Take 100 mg by mouth daily.    . Magnesium 250 MG TABS Take 250 mg by mouth 2 (two) times daily.    . methimazole (TAPAZOLE) 5 MG tablet Take 5 mg by mouth daily.    . methimazole (TAPAZOLE) 5 MG tablet Take by mouth.   . Multiple Vitamin (MULTIVITAMIN WITH MINERALS) TABS tablet Take 1 tablet by mouth daily. Men's One-A-Day   . ondansetron (ZOFRAN) 8 MG tablet Take 1 tablet (8 mg total) by mouth every 8 (eight) hours as needed for nausea or vomiting.   . OXALIPLATIN IV Inject into the vein.   . pantoprazole (PROTONIX) 40 MG tablet TAKE ONE TABLET BY MOUTH TWICE DAILY BEFORE A MEAL (Patient taking differently: Take 40 mg by mouth daily. )   . prochlorperazine (COMPAZINE) 10 MG tablet Take 1 tablet (10 mg total) by mouth every 6 (six) hours as needed (Nausea or vomiting).   . simvastatin (ZOCOR) 40 MG tablet Take 40 mg by mouth every evening.     No facility-administered encounter medications on file as of 10/16/2018.     ALLERGIES:  No Known Allergies   PHYSICAL EXAM:  ECOG Performance status: 1  Vitals:   10/16/18 0850  BP: (!) 144/75  Pulse: 76  Resp: 18  Temp: 97.7 F (36.5 C)  SpO2: 98%   Filed Weights   10/16/18 0850  Weight: 213 lb (96.6 kg)    Physical Exam   Constitutional: He is oriented to person, place, and time. He appears well-developed and well-nourished.  Musculoskeletal: Normal range of motion.  Neurological: He is alert and oriented to person, place, and time.  Skin: Skin is warm and dry.  Psychiatric: He has a normal mood and affect. His behavior is normal. Judgment and thought content normal.     LABORATORY DATA:  I have reviewed the labs as listed.  CBC    Component Value Date/Time   WBC 4.9 09/26/2018 1537   RBC 4.49 09/26/2018 1537   HGB 13.7 09/26/2018 1537   HCT 40.9 09/26/2018 1537   PLT 223 09/26/2018 1537   MCV 91.1 09/26/2018 1537   MCH 30.5 09/26/2018 1537   MCHC 33.5 09/26/2018 1537   RDW 13.9 09/26/2018 1537   LYMPHSABS 1.2 09/26/2018 1537   MONOABS 0.4 09/26/2018 1537   EOSABS 0.2 09/26/2018 1537   BASOSABS 0.1 09/26/2018 1537   CMP Latest Ref Rng & Units 09/26/2018 09/19/2018 08/29/2018  Glucose 70 - 99 mg/dL 136(H) 122(H) 120(H)  BUN 6 - 20 mg/dL 13 13 19  Creatinine 0.61 - 1.24 mg/dL 0.93 0.88 1.09  Sodium 135 - 145 mmol/L 138 138 138  Potassium 3.5 - 5.1 mmol/L 3.4(L) 3.6 3.7  Chloride 98 - 111 mmol/L 105 104 103  CO2 22 - 32 mmol/L 26 25 27  Calcium 8.9 - 10.3 mg/dL 8.9 9.3 9.2  Total Protein 6.5 - 8.1 g/dL 7.1 7.5 7.3  Total Bilirubin 0.3 - 1.2 mg/dL 0.7 0.4 0.4  Alkaline Phos 38 - 126 U/L 74 71 71  AST 15 - 41 U/L 34 21 17  ALT 0 - 44 U/L 52(H) 23 14           ASSESSMENT & PLAN:   Cancer of sigmoid colon ([place]) 1.  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 - Colonoscopy on 07/05/2018 shows a 1 mass, biopsy consistent with invasive moderately differentiated adenocarcinoma. - CT CAP on 07/10/2018 showing mass within the rectosigmoid colon, no evidence of nodal or distant metastatic disease. - MRI of the pelvis rectal protocol on 07/16/2018 shows a 4.2 cm polypoid lesion along  the right lateral aspect of the upper rectum, 14 cm from anal sphincter, T stage: T1 or T2, N0. - Robotic low anterior resection on 08/07/2018 - Pathology showing invasive moderately differentiated colorectal adenocarcinoma, 3.5 cm, carcinoma focally involving pericolonic connective tissue, 1/15 lymph nodes positive, margins negative - His case was discussed at the GI tumor board by Dr. Marcello Moores and others.  It was felt to be a sigmoid colon cancer rather than rectal cancer due to its relation with the peritoneal reflection.  Radiation oncology felt that he does not need radiation. - Hence he was started on XELOX cycle 1 on 08/29/2018.  He took Xeloda 2 g twice daily 2 weeks on 1 week off. -He did experience cold sensitivity throughout the last 3 weeks.  He also had diplopia lasting couple of days after last treatment. -We discussed the results from [facility] study, which showed 3 months of Xeloda and oxaliplatin has shown similar disease-free survival and significantly lower rates of oxaliplatin neuropathy compared to 6 months of treatment.  However overall survival results are not yet mature from the [employer].  We will see how he tolerates oxaliplatin down the line.  If he does not tolerate well, we will plan to cut short his treatment to 3 months. - He received cycle 2 on 09/28/2018.  After his cycle 2, he experienced cold sensitivity lasting at least 2-3 weeks.  He was also severely weak and reportedly was in bed for 3 days.  He also had episodes of nausea and was using Phenergan, Compazine and Zofran.  He also used lorazepam without much help.  He did throw up few times.  Denies any tingling or numbness in the extremities at this time. -He is also very concerned that if he develops neuropathy, it will affect his professional life.  He is a Land and requires fine motor skills. -We had a prolonged discussion about further plan of chemotherapy.  Because of his young age and node positive  disease, I have recommended dose reduction of oxaliplatin and continuing at least 3 months of XELOX based on IDEA study.  If he does not want to continue oxaliplatin at all, I have recommended Xeloda to be continued until 6 months.  This will be followed by very close surveillance once every 3 months. - He would like to think about it and will come back next Tuesday for repeat blood work.  2.  Family history: - Mother had breast cancer.  She later developed lung cancer and was a smoker.  Maternal aunt also had lung cancer. -Based on his young age her diagnosis, I would recommend genetic testing.       Orders placed this encounter:  Orders Placed This Encounter  Procedures  . CEA  . CBC with Differential/Platelet  . Comprehensive metabolic panel      Derek Jack, MD [place] [phone]

## 2018-10-23 ENCOUNTER — Other Ambulatory Visit (HOSPITAL_COMMUNITY): Payer: Self-pay

## 2018-10-25 ENCOUNTER — Other Ambulatory Visit (HOSPITAL_COMMUNITY): Payer: Self-pay

## 2018-10-25 ENCOUNTER — Other Ambulatory Visit: Payer: Self-pay

## 2018-10-25 ENCOUNTER — Ambulatory Visit (HOSPITAL_COMMUNITY): Payer: Self-pay | Admitting: Hematology

## 2018-10-25 ENCOUNTER — Inpatient Hospital Stay: Payer: Commercial Managed Care - PPO | Attending: Hematology | Admitting: Hematology

## 2018-10-25 ENCOUNTER — Ambulatory Visit (HOSPITAL_COMMUNITY): Payer: Self-pay

## 2018-10-25 VITALS — BP 151/80 | HR 89 | Temp 97.7°F | Resp 18 | Ht 75.0 in | Wt 212.7 lb

## 2018-10-25 DIAGNOSIS — Z801 Family history of malignant neoplasm of trachea, bronchus and lung: Secondary | ICD-10-CM

## 2018-10-25 DIAGNOSIS — C187 Malignant neoplasm of sigmoid colon: Secondary | ICD-10-CM

## 2018-10-25 DIAGNOSIS — C19 Malignant neoplasm of rectosigmoid junction: Secondary | ICD-10-CM | POA: Diagnosis present

## 2018-10-25 DIAGNOSIS — R110 Nausea: Secondary | ICD-10-CM | POA: Insufficient documentation

## 2018-10-25 DIAGNOSIS — I10 Essential (primary) hypertension: Secondary | ICD-10-CM

## 2018-10-25 DIAGNOSIS — Z803 Family history of malignant neoplasm of breast: Secondary | ICD-10-CM | POA: Insufficient documentation

## 2018-10-25 DIAGNOSIS — R740 Nonspecific elevation of levels of transaminase and lactic acid dehydrogenase [LDH]: Secondary | ICD-10-CM | POA: Insufficient documentation

## 2018-10-25 DIAGNOSIS — G620 Drug-induced polyneuropathy: Secondary | ICD-10-CM | POA: Insufficient documentation

## 2018-10-25 LAB — CBC WITH DIFFERENTIAL (CANCER CENTER ONLY)
Abs Immature Granulocytes: 0.01 10*3/uL (ref 0.00–0.07)
Basophils Absolute: 0.1 10*3/uL (ref 0.0–0.1)
Basophils Relative: 1 %
Eosinophils Absolute: 0.1 10*3/uL (ref 0.0–0.5)
Eosinophils Relative: 1 %
HCT: 39.5 % (ref 39.0–52.0)
Hemoglobin: 13.7 g/dL (ref 13.0–17.0)
IMMATURE GRANULOCYTES: 0 %
Lymphocytes Relative: 23 %
Lymphs Abs: 1.1 10*3/uL (ref 0.7–4.0)
MCH: 32.4 pg (ref 26.0–34.0)
MCHC: 34.7 g/dL (ref 30.0–36.0)
MCV: 93.4 fL (ref 80.0–100.0)
Monocytes Absolute: 0.7 10*3/uL (ref 0.1–1.0)
Monocytes Relative: 14 %
Neutro Abs: 3 10*3/uL (ref 1.7–7.7)
Neutrophils Relative %: 61 %
PLATELETS: 185 10*3/uL (ref 150–400)
RBC: 4.23 MIL/uL (ref 4.22–5.81)
RDW: 15.5 % (ref 11.5–15.5)
WBC Count: 4.9 10*3/uL (ref 4.0–10.5)
nRBC: 0 % (ref 0.0–0.2)

## 2018-10-25 LAB — CMP (CANCER CENTER ONLY)
ALT: 162 U/L — ABNORMAL HIGH (ref 0–44)
AST: 50 U/L — ABNORMAL HIGH (ref 15–41)
Albumin: 4.1 g/dL (ref 3.5–5.0)
Alkaline Phosphatase: 83 U/L (ref 38–126)
Anion gap: 10 (ref 5–15)
BUN: 11 mg/dL (ref 6–20)
CHLORIDE: 106 mmol/L (ref 98–111)
CO2: 24 mmol/L (ref 22–32)
Calcium: 9 mg/dL (ref 8.9–10.3)
Creatinine: 1.08 mg/dL (ref 0.61–1.24)
GFR, Est AFR Am: >60 mL/min (ref >60)
GFR, Estimated: >60 mL/min (ref >60)
Glucose, Bld: 93 mg/dL (ref 70–99)
Potassium: 3.8 mmol/L (ref 3.5–5.1)
SODIUM: 140 mmol/L (ref 135–145)
Total Bilirubin: 0.7 mg/dL (ref 0.3–1.2)
Total Protein: 7.3 g/dL (ref 6.5–8.1)

## 2018-10-25 MED ORDER — SODIUM CHLORIDE 0.9% FLUSH
10.0000 mL | INTRAVENOUS | Status: DC | PRN
  Administered 2018-10-25: 10 mL via INTRAVENOUS
  Filled 2018-10-25: qty 10

## 2018-10-25 MED ORDER — HEPARIN SOD (PORK) LOCK FLUSH 100 UNIT/ML IV SOLN
500.0000 [IU] | Freq: Once | INTRAVENOUS | Status: AC
  Administered 2018-10-25: 500 [IU] via INTRAVENOUS
  Filled 2018-10-25: qty 5

## 2018-10-25 NOTE — Progress Notes (Signed)
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 male is a here for a second opinion of his rectosigmoid cancer treatment. The patient's referred by requested by pt himself and facilitated by radiation NP Bryson Ha. He is currently on adjuvant chemo under Dr. Tomie China care. The patient presents to the clinic today accompanied by his girlfriend.  He had intermittent rectal bleeding early this year with regular bowel movements and denied weight loss. He tried suppositories for hemorrhoids with no change. He underwent colonoscopy to discover a tumor in the rectum. Biopsy results confirm cancer. He has completed tumor resection and has started CAPOX on 08/29/18.   Today he notes still feeling fatigued after 2nd cycle of treatment. He notes he can function with Xeloda but has longer time to recover with oxaliplatin. He is concerned with neuropathy and this effects his works. He can work with tools better now. His cold sensitivity would last through the 3 weeks. He also notes significant N&V after infusion with no relief from antiemetics. He feels his quality of life is low with Oxaliplatin. He also notes getting chemo brain which will effects him remembering people. He feels he is not supported or heard with Med Onc at [facility]. He will see genetic counselor in 11/2018 at [facility]. He notes his surgical incision is tender to him. He notes he would like to change his care to be managed by me.   He feels is quality of life is very low with Oxaliplatin. 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 Outside of chronic HTN, he is very healthy and active person. He had no family history of colon cancer. His mother had breast and lung cancer. He does not drink alcohol. He takes [place] and losartan for his HTN.    REVIEW OF SYSTEMS:   Constitutional: Denies fevers, chills or abnormal night sweats (+) fatigue Eyes: Denies blurriness of vision, double vision or watery eyes Ears, nose, mouth, throat, and face: Denies mucositis or sore throat Respiratory: Denies cough, dyspnea or wheezes Cardiovascular: Denies palpitation, chest discomfort or lower extremity swelling Gastrointestinal:  Denies heartburn or change in bowel habits (+) nausea  Skin: Denies abnormal skin rashes Lymphatics: Denies new lymphadenopathy or easy bruising Neurological:Denies numbness, tingling or new weaknesses (+) chemo brain (+) mildly neuropathy (+) cold sensitivity Behavioral/Psych: Mood is stable, no new changes  All other systems were reviewed with the patient and are negative.    MEDICAL HISTORY:  Past Medical History:  Diagnosis Date  . Allergic rhinitis   . Anxiety   . Anxiety and depression    DENIES   . Bruxism   . Colon cancer ([place])   . Essential hypertension, benign   . GERD   . Hyperlipidemia    Since age 23  . Meniere's disease     SURGICAL HISTORY: Past Surgical History:  Procedure Laterality Date  . BIOPSY N/A 03/20/2013   Procedure: BIOPSY;  Surgeon: [name], MD;  Location: [facility];  Service: Endoscopy;  Laterality: N/A;  Possible small bowel biopsy  .  BIOPSY  07/05/2018   Procedure: BIOPSY;  Surgeon: [name], MD;  Location: [facility];  Service: Endoscopy;;  rectal mass  . COLONOSCOPY WITH PROPOFOL N/A 07/05/2018   Procedure: COLONOSCOPY WITH PROPOFOL;  Surgeon: [name], MD;  Location: [facility];  Service: Endoscopy;  Laterality: N/A;  8:30am  . ESOPHAGOGASTRODUODENOSCOPY N/A 03/20/2013    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  One slightly nodular.  Small hiatal hernia.  Multiple fundic gland appearing polyps. no evidence of celiac disease, malignancy, h.pylori, barrett's.   . None    . POLYPECTOMY  07/05/2018   Procedure: POLYPECTOMY;  Surgeon: [name], MD;  Location: [facility];  Service: Endoscopy;;  hepatic flexurex2;  . PORTACATH PLACEMENT N/A 08/23/2018   Procedure: INSERTION PORT-A-CATH;  Surgeon: [name], MD;  Location: [facility];  Service: General;  Laterality: N/A;  LMA  . XI ROBOTIC ASSISTED LOWER ANTERIOR RESECTION      SOCIAL HISTORY: Social History   Socioeconomic History  . Marital status: Divorced    Spouse name: Not on file  . Number of children: 2  . Years of education: Not on file  . Highest education level: Not on file  Occupational History  . Occupation: works with Sales executive at [employer]  . Financial resource strain: Not on file  . Food insecurity:    Worry: Not on file    Inability: Not on file  . Transportation needs:    Medical: Not on file    Non-medical: Not on file  Tobacco Use  . Smoking status: Never Smoker  . Smokeless tobacco: Never Used  Substance and Sexual Activity  . Alcohol use: No  . Drug use: No  . Sexual activity: Yes  Lifestyle  . Physical activity:    Days per week: Not on file    Minutes per session: Not on file  . Stress: Not on file  Relationships  . Social connections:    Talks on phone: Not on file    Gets together: Not on file    Attends religious service: Not on file    Active member of club or organization: Not on file    Attends meetings of clubs or organizations: Not on file    Relationship status: Not on file  . Intimate partner violence:    Fear of current or ex partner: Not on file    Emotionally abused: Not on file    Physically abused: Not on file     Forced sexual activity: Not on file  Other Topics Concern  . Not on file  Social History Narrative   Unable to ask intimate partner questions.    FAMILY HISTORY: Family History  Problem Relation Age of Onset  . Breast cancer Mother   . Thyroid disease Mother        Thyroid removed  . Lung cancer Mother   . Crohn's disease Mother        ???  . Heart disease Father   . Stroke Father   . Hyperlipidemia Father   . AAA (abdominal aortic aneurysm) Father   . Valvular heart disease Brother        Bicuspid aortic valve  . Lung cancer Maternal Aunt   . Colon cancer Neg Hx     ALLERGIES:  has No Known Allergies.  MEDICATIONS:  Current Outpatient Medications  Medication Sig Dispense Refill  .  acetaminophen (TYLENOL) 500 MG tablet Take 500 mg by mouth every 6 (six) hours as needed for moderate pain or headache.     . Ascorbic Acid (VITAMIN C) 1000 MG tablet Take 1,000 mg by mouth daily.    Marland Kitchen b complex vitamins capsule Take 1 capsule by mouth daily.    . capecitabine (XELODA) 500 MG tablet Take 4 tablets (2,000 mg total) by mouth 2 (two) times daily after a meal. Take on days 1-14 of chemotherapy. Repeat every 21 days. 112 tablet 7  . cetirizine (ZYRTEC) 10 MG tablet Take 10 mg by mouth daily as needed for allergies.     . diazepam (VALIUM) 5 MG tablet Take 2.5 mg by mouth at bedtime.   1  . diphenhydrAMINE (BENADRYL) 25 mg capsule Take 25 mg by mouth daily as needed for allergies.     . fluticasone (FLONASE) 50 MCG/ACT nasal spray Place 2 sprays into the nose daily. (Patient taking differently: Place 2 sprays into the nose daily as needed (for seasonal allergies.). ) 16 g 11  . hydrochlorothiazide (MICROZIDE) 12.5 MG capsule Take 12.5 mg by mouth daily.  5  . ibuprofen (ADVIL,MOTRIN) 400 MG tablet Take 1-2 tablets (400-800 mg total) by mouth every 6 (six) hours as needed for fever, headache, mild pain or moderate pain. 30 tablet 0  . lidocaine-prilocaine (EMLA) cream Apply small amount  over port site and cover with plastic wrap one hour prior to appointment. 30 g 3  . LORazepam (ATIVAN) 1 MG tablet Take 1 tablet (1 mg total) by mouth every 8 (eight) hours as needed for anxiety. 30 tablet 0  . losartan (COZAAR) 100 MG tablet Take 100 mg by mouth daily.   5  . Magnesium 250 MG TABS Take 250 mg by mouth 2 (two) times daily.     . methimazole (TAPAZOLE) 5 MG tablet Take 5 mg by mouth daily.     . methimazole (TAPAZOLE) 5 MG tablet Take by mouth.    . Multiple Vitamin (MULTIVITAMIN WITH MINERALS) TABS tablet Take 1 tablet by mouth daily. Men's One-A-Day    . ondansetron (ZOFRAN) 8 MG tablet Take 1 tablet (8 mg total) by mouth every 8 (eight) hours as needed for nausea or vomiting. 30 tablet 1  . OXALIPLATIN IV Inject into the vein.    . pantoprazole (PROTONIX) 40 MG tablet TAKE ONE TABLET BY MOUTH TWICE DAILY BEFORE A MEAL (Patient taking differently: Take 40 mg by mouth daily. ) 60 tablet 0  . prochlorperazine (COMPAZINE) 10 MG tablet Take 1 tablet (10 mg total) by mouth every 6 (six) hours as needed (Nausea or vomiting). 30 tablet 1  . simvastatin (ZOCOR) 40 MG tablet Take 40 mg by mouth every evening.      Current Facility-Administered Medications  Medication Dose Route Frequency Provider Last Rate Last Dose  . sodium chloride flush (NS) 0.9 % injection 10 mL  10 mL Intravenous PRN Truitt Merle, MD   10 mL at 10/25/18 1607    PHYSICAL EXAMINATION: ECOG PERFORMANCE STATUS: 1 - Symptomatic but completely ambulatory  Vitals:   10/25/18 1444  BP: (!) 151/80  Pulse: 89  Resp: 18  Temp: 97.7 F (36.5 C)  SpO2: 97%   Filed Weights   10/25/18 1444  Weight: 212 lb 11.2 oz (96.5 kg)    GENERAL:alert, no distress and comfortable SKIN: skin color, texture, turgor are normal, no rashes or significant lesions EYES: normal, conjunctiva are pink and non-injected, sclera clear OROPHARYNX:no exudate, no  erythema and lips, buccal mucosa, and tongue normal  NECK: supple, thyroid  normal size, non-tender, without nodularity LYMPH:  no palpable lymphadenopathy in the cervical, axillary or inguinal LUNGS: clear to auscultation and percussion with normal breathing effort HEART: regular rate & rhythm and no murmurs and no lower extremity edema ABDOMEN: abdomen soft, non-tender and normal bowel sounds (+) midline surgical incision healed very well, slight scar tissue (+) epigastric tenderness Musculoskeletal:no cyanosis of digits and no clubbing  PSYCH: alert & oriented x 3 with fluent speech NEURO: no focal motor/sensory deficits (+) very slight sensory deficit  LABORATORY DATA:  I have reviewed the data as listed CBC Latest Ref Rng & Units 10/25/2018 09/26/2018 09/19/2018  WBC 4.0 - 10.5 K/uL 4.9 4.9 3.2(L)  Hemoglobin 13.0 - 17.0 g/dL 13.7 13.7 14.0  Hematocrit 39.0 - 52.0 % 39.5 40.9 41.8  Platelets 150 - 400 K/uL 185 223 263    CMP Latest Ref Rng & Units 10/25/2018 09/26/2018 09/19/2018  Glucose 70 - 99 mg/dL 93 136(H) 122(H)  BUN 6 - 20 mg/dL '11 13 13  ' Creatinine 0.61 - 1.24 mg/dL 1.08 0.93 0.88  Sodium 135 - 145 mmol/L 140 138 138  Potassium 3.5 - 5.1 mmol/L 3.8 3.4(L) 3.6  Chloride 98 - 111 mmol/L 106 105 104  CO2 22 - 32 mmol/L '24 26 25  ' Calcium 8.9 - 10.3 mg/dL 9.0 8.9 9.3  Total Protein 6.5 - 8.1 g/dL 7.3 7.1 7.5  Total Bilirubin 0.3 - 1.2 mg/dL 0.7 0.7 0.4  Alkaline Phos 38 - 126 U/L 83 74 71  AST 15 - 41 U/L 50(H) 34 21  ALT 0 - 44 U/L 162(H) 52(H) 23    RADIOGRAPHIC STUDIES: I have personally reviewed the radiological images as listed and agreed with the findings in the report. No results found.  ASSESSMENT & PLAN:  CHAS AXEL is a 37 y.o. Caucasian male with a history of Anxiety/depression, HTN, GERD, HLD and meniere's disease.   1. Rectosigmoid Cancer, Stage IIIB (pT3N1M0), MMR normal -I reviewed his oncology history and pathology in great detail with patient and his partner. He was diagnosed with moderately differentiated rectosigmoid  adenocarcinoma in 06/2018. He is s/p anterior resection resection with clear margins.   -His case was discussed in our GI tumor board, and he was seen by radiation oncologist Dr. Lisbeth Renshaw.  Due to the tumor location mainly in sigmoid colon, radiation was not recommended. -We discussed the natural history of colon cancer, and the risk of recurrence in details.  Due to his stage IIIB disease, his recurrence with surgery alone is probably around 40-50%, with oxaliplatin based adjuvant chemotherapy, recurrence will probably reduce to 25-30%  -I agree with adjuvant CAPOX which he started on 08/29/18. Based on the [facility] data, a total of 3 months CAPOX (4 cycles) is planned -He is tolerating Xeloda well but is poorly tolerating Oxaliplatin with nausea, vomiting, cold sensitivity, mild neuropathy, chemo brain and significant fatigue. He shares serous concern with quality of life with this treatment.  -he has completed 2 cycles CAPOX.  We discussed that oxaliplatin in addition to Xeloda or 5-FU will reduce recurrence further by 5%, however majority of the chemo benefit is derived from 5-FU or Xeloda, I think 2 additional cycle of oxaliplatin will give him a small but limited benefit.  Due to his poor tolerance to oxaliplatin, I recommend him to stop oxaliplatin, and continue Xeloda, for total of 8 cycle (additional 6 cycles). -Patient is quite relieved when we  discussed the clinical data and concluded it is okay to stop oxaliplatin.  He is willing to continue Xeloda. -He is schedule for genetic testing 11/2018 given his young age. If he wants to be seen sooner he can be scheduled at [facility].  -He requests to transfer his oncology care from [facility] to [place] --I discussed if he tolerates well, may consider increase to 2050m in the AM and 2500 in the PM (1033mm2 bid dosing)  -I discussed the risk of cancer recurrence in the future. I discussed the surveillance plan, which is a physical exam and lab test (including CBC, CMP and  CEA) every 3 months for the first 2 years, then every 6-12 months, colonoscopy in one year, and surveillance CT scan every 6-12 month for up to 5 year.  -lab and port flush today,he will start cycle 3 Xeloda tomorrow, 4 tab in morning and 5 tab in evening  -F/u in 3 weeks    2. HTN -On Losartan and HTCZ -Usually controled. Elevated at 151/80 today (10/25/18) -Continue to f/u with Dr. McDomenic Polite   3. Neuropathy, secondary to Oxaliplatin, G1 -Currently mild -To improve recovery, I recommend he continue B Complex.  -Will monitor    PLAN: -Lab and port flush today  -He will start Xeloda tomorrow  -Lab, flush and f/u in 3 weeks     Orders Placed This Encounter  Procedures  . CBC with Differential (Cancer Center Only)    Standing Status:   Standing    Number of Occurrences:   100    Standing Expiration Date:   10/26/2023  . CMP ([facility])    Standing Status:   Standing    Number of Occurrences:   100    Standing Expiration Date:   10/26/2023  . CEA (IN HOUSE-[facility])    Standing Status:   Standing    Number of Occurrences:   100    Standing Expiration Date:   10/26/2023    All questions were answered. The patient knows to call the clinic with any problems, questions or concerns. I spent 40 minutes counseling the patient face to face. The total time spent in the appointment was 50 minutes and more than 50% was on counseling.     YaTruitt MerleMD 10/25/2018 11:14 PM

## 2018-10-26 ENCOUNTER — Encounter: Payer: Self-pay | Admitting: Hematology

## 2018-10-29 ENCOUNTER — Telehealth: Payer: Self-pay

## 2018-10-29 ENCOUNTER — Other Ambulatory Visit: Payer: Self-pay | Admitting: Genetics

## 2018-10-29 ENCOUNTER — Telehealth: Payer: Self-pay | Admitting: Hematology

## 2018-10-29 DIAGNOSIS — C19 Malignant neoplasm of rectosigmoid junction: Secondary | ICD-10-CM

## 2018-10-29 DIAGNOSIS — C20 Malignant neoplasm of rectum: Secondary | ICD-10-CM

## 2018-10-29 DIAGNOSIS — C187 Malignant neoplasm of sigmoid colon: Secondary | ICD-10-CM

## 2018-10-29 NOTE — Telephone Encounter (Signed)
-----   Message from Truitt Merle, MD sent at 10/27/2018 10:33 PM EST ----- Please let pt know that his liver enzymes are slightly elevated, probably related to previous chemo. OK to continue Xeloda for now, but return in one week (12/13) for lab, please set up if he agrees. Thanks.  Truitt Merle  10/27/2018

## 2018-10-29 NOTE — Telephone Encounter (Signed)
Scheduled appt per 12/9 sch message pt is aware of appt date and time   

## 2018-10-29 NOTE — Telephone Encounter (Signed)
Left voice message for patient that received lab results per Dr. Burr Medico, lab results show that liver enzymes are slightly elevated, probably due to previous chemotherapy.  Continue Xeloda for now and return on Friday 12/13 for repeat lab.    Encouraged patient to call back if he has any questions. Scheduling message was sent.

## 2018-11-02 ENCOUNTER — Inpatient Hospital Stay: Payer: Commercial Managed Care - PPO

## 2018-11-02 ENCOUNTER — Other Ambulatory Visit: Payer: Self-pay

## 2018-11-02 DIAGNOSIS — C19 Malignant neoplasm of rectosigmoid junction: Secondary | ICD-10-CM | POA: Diagnosis not present

## 2018-11-02 DIAGNOSIS — C187 Malignant neoplasm of sigmoid colon: Secondary | ICD-10-CM

## 2018-11-02 LAB — CBC WITH DIFFERENTIAL (CANCER CENTER ONLY)
ABS IMMATURE GRANULOCYTES: 0.01 10*3/uL (ref 0.00–0.07)
BASOS PCT: 1 %
Basophils Absolute: 0.1 10*3/uL (ref 0.0–0.1)
Eosinophils Absolute: 0 10*3/uL (ref 0.0–0.5)
Eosinophils Relative: 1 %
HCT: 40.5 % (ref 39.0–52.0)
Hemoglobin: 13.7 g/dL (ref 13.0–17.0)
Immature Granulocytes: 0 %
Lymphocytes Relative: 31 %
Lymphs Abs: 1.3 10*3/uL (ref 0.7–4.0)
MCH: 31.8 pg (ref 26.0–34.0)
MCHC: 33.8 g/dL (ref 30.0–36.0)
MCV: 94 fL (ref 80.0–100.0)
Monocytes Absolute: 0.4 10*3/uL (ref 0.1–1.0)
Monocytes Relative: 8 %
NEUTROS ABS: 2.5 10*3/uL (ref 1.7–7.7)
Neutrophils Relative %: 59 %
PLATELETS: 190 10*3/uL (ref 150–400)
RBC: 4.31 MIL/uL (ref 4.22–5.81)
RDW: 14.6 % (ref 11.5–15.5)
WBC Count: 4.3 10*3/uL (ref 4.0–10.5)
nRBC: 0 % (ref 0.0–0.2)

## 2018-11-02 LAB — CMP (CANCER CENTER ONLY)
ALBUMIN: 4.1 g/dL (ref 3.5–5.0)
ALT: 56 U/L — ABNORMAL HIGH (ref 0–44)
AST: 29 U/L (ref 15–41)
Alkaline Phosphatase: 83 U/L (ref 38–126)
Anion gap: 9 (ref 5–15)
BUN: 12 mg/dL (ref 6–20)
CO2: 27 mmol/L (ref 22–32)
Calcium: 9.1 mg/dL (ref 8.9–10.3)
Chloride: 104 mmol/L (ref 98–111)
Creatinine: 1.12 mg/dL (ref 0.61–1.24)
GFR, Est AFR Am: >60 mL/min (ref >60)
GFR, Estimated: >60 mL/min (ref >60)
Glucose, Bld: 105 mg/dL — ABNORMAL HIGH (ref 70–99)
POTASSIUM: 4 mmol/L (ref 3.5–5.1)
Sodium: 140 mmol/L (ref 135–145)
Total Bilirubin: 0.4 mg/dL (ref 0.3–1.2)
Total Protein: 7.3 g/dL (ref 6.5–8.1)

## 2018-11-05 ENCOUNTER — Telehealth: Payer: Self-pay

## 2018-11-05 LAB — CEA (IN HOUSE-CHCC): CEA (CHCC-In House): 1.4 ng/mL (ref 0.00–5.00)

## 2018-11-05 NOTE — Telephone Encounter (Signed)
-----   Message from Truitt Merle, MD sent at 11/04/2018 11:51 AM EST ----- Please let pt know his lab results, LFTs much improved, OK to continue this cycle Xeloda and complete 14 days course. Thanks  Truitt Merle  11/04/2018

## 2018-11-05 NOTE — Telephone Encounter (Signed)
Spoke with patient per Dr. Burr Medico to inform him LFTS much improved, ok to continue this cycle of Xeloda and complete the 14 day course.  Patient verbalized an understanding.

## 2018-11-15 NOTE — Progress Notes (Signed)
[place]   [phone] [phone]   Clinic Follow up Note   Patient Care Team: Chesley Noon, MD as PCP - General (Family Medicine) [name], MD (Cardiology) [name], MD as Attending Physician (Gastroenterology)  Date of Service:  11/16/2018  CHIEF COMPLAINT: F/u of Stage III rectosigmoid cancer  SUMMARY OF ONCOLOGIC HISTORY: Oncology History   Cancer Staging Cancer of sigmoid colon [facility]) Staging form: Colon and Rectum, AJCC 8th Edition - Pathologic stage from 08/07/2018: Stage IIIB (pT3, pN1a, cM0) - Signed by Truitt Merle, MD on 10/25/2018       Cancer of sigmoid colon ([place])   07/05/2018 Procedure    Colonoscopy by [name] 07/05/18  IMPRESSION - Tumor in the rectum. Biopsied. - Diverticulosis in the entire examined colon. - Two 5 to 6 mm polyps, removed with a cold snare. Resected and retrieved. - The examination was otherwise normal on direct and retroflexion views.     07/05/2018 Initial Biopsy    Diagnosis 07/05/18 1. Colon, polyp(s), hepatic flexure - TUBULAR ADENOMA (ONE). - NO HIGH GRADE DYSPLASIA OR MALIGNANCY. 2. Rectum, biopsy, mass - INVASIVE MODERATELY DIFFERENTIATED COLORECTAL ADENOCARCINOMA.    07/10/2018 Imaging    CT CAP W Constrast 07/10/18  IMPRESSION: 1. Mass within the rectosigmoid colon is identified compatible with findings from recent colonoscopy. 2. No evidence for nodal metastasis or distant metastatic disease. 3. Left kidney cyst.    07/17/2018 Imaging    MRI PElvis 07/17/18  IMPRESSION: 4.2 cm polypoid lesion along the right lateral aspect of the upper rectum, corresponding to the patient's biopsy-proven rectal adenocarcinoma, as above. Rectal adenocarcinoma T stage:  T1 or T2 Rectal adenocarcinoma N stage:  N0 Distance from tumor to the anal sphincter is 14 cm.    08/07/2018 Initial Diagnosis    Rectosigmoid cancer ([place])    08/07/2018 Surgery    XI ROBOTIC LOW ANTERIOR  RESECTION by Dr. Kieth Brightly and Dr. Marcello Moores     08/07/2018 Pathologic Stage    Diagnosis 08/07/18 1. Colon, segmental resection for tumor, rectosigmoid - INVASIVE MODERATELY DIFFERENTIATED COLORECTAL ADENOCARCINOMA, 3.5 CM. - CARCINOMA FOCALLY INVOLVES PERICOLONIC CONNECTIVE TISSUE. - METASTATIC CARCINOMA IN ONE OF FIFTEEN LYMPH NODES (1/15). - MARGINS NOT INVOLVED. 2. Colon, resection margin (donut), distal ring, sigmoid - BENIGN COLON. - NO EVIDENCE OF MALIGNANCY.    08/07/2018 Cancer Staging    Staging form: Colon and Rectum, AJCC 8th Edition - Pathologic stage from 08/07/2018: Stage IIIB (pT3, pN1a, cM0) - Signed by Truitt Merle, MD on 10/25/2018    08/29/2018 -  Chemotherapy    Adjuvant CAPOX every 3 weeks with Xeloda 2037m twice daily 2 weeks on 1 week off.starting 08/29/18. D/c Oxaliplatin after 09/28/18 due to very poor toleration. Increased to 20010min AM and 250053mn PM starting with cycle 4.       CURRENT THERAPY:  Adjuvant CAPOX every 3 weeks with Xeloda 2000m36mice daily 2 weeks on 1 week off.starting 08/29/18. D/c Oxaliplatin after 09/28/18 due to very poor toleration. Increased to 2000mg42mAM and 2500mg 37mM starting with cycle 4  INTERVAL HISTORY:  Jacob OSUALDO HANSELLre for a follow up of rectosigmoid cancer. He presents to the clinic today by himself. He notes he is doing well. He notes his taste and appetite improved and he was able to eat more. He does have nausea with first 3 days of Xeloda his appetite is lower due to nausea. The first 10 days he has  lower energy but can improve and back to baseline on his week off. He has no skin toxicity. He is taking 2059m BID currently and willing to increase 1 pill at night.    REVIEW OF SYSTEMS:   Constitutional: Denies fevers, chills or abnormal weight loss (+) improved taste and appetite  Eyes: Denies blurriness of vision Ears, nose, mouth, throat, and face: Denies mucositis or sore throat Respiratory: Denies cough, dyspnea or  wheezes Cardiovascular: Denies palpitation, chest discomfort or lower extremity swelling Gastrointestinal:  Denies nausea, heartburn or change in bowel habits Skin: Denies abnormal skin rashes Lymphatics: Denies new lymphadenopathy or easy bruising Neurological:Denies numbness, tingling or new weaknesses Behavioral/Psych: Mood is stable, no new changes  All other systems were reviewed with the patient and are negative.  MEDICAL HISTORY:  Past Medical History:  Diagnosis Date  . Allergic rhinitis   . Anxiety   . Anxiety and depression    DENIES   . Bruxism   [place]   . Essential hypertension, benign   . GERD   . Hyperlipidemia    Since age 37 . Meniere's disease     SURGICAL HISTORY: Past Surgical History:  Procedure Laterality Date  . BIOPSY N/A 03/20/2013   Procedure: BIOPSY;  Surgeon: RDaneil Dolin MD;  [facility];  Service: Endoscopy;  Laterality: N/A;  Possible small bowel biopsy  . BIOPSY  07/05/2018   Procedure: BIOPSY;  Surgeon: RDaneil Dolin MD;  Location: [facility];  Service: Endoscopy;;  rectal mass  . COLONOSCOPY WITH PROPOFOL N/A 07/05/2018   Procedure: COLONOSCOPY WITH PROPOFOL;  Surgeon: RDaneil Dolin MD;  [facility]: [facility];  Service: Endoscopy;  Laterality: N/A;  8:30am  . ESOPHAGOGASTRODUODENOSCOPY N/A 03/20/2013   Dr. RGala Romney in the upper esophagus, through the upper esophageal sphincter, multiple areas of salmon-colored epithelium consistent with inlet patches.  One slightly nodular.  Small hiatal hernia.  Multiple fundic gland appearing polyps. no evidence of celiac disease, malignancy, h.pylori, barrett's.   . None    . POLYPECTOMY  07/05/2018   Procedure: POLYPECTOMY;  Surgeon: RDaneil Dolin MD;  [facility];  Service: Endoscopy;;  hepatic flexurex2;  . PORTACATH PLACEMENT N/A 08/23/2018   Procedure: INSERTION PORT-A-CATH;  Surgeon: TLeighton Ruff MD;  Location: [facility];  Service: General;  Laterality:  N/A;  LMA  . XI ROBOTIC ASSISTED LOWER ANTERIOR RESECTION      I have reviewed the social history and family history with the patient and they are unchanged from previous note.  ALLERGIES:  has No Known Allergies.  MEDICATIONS:  Current Outpatient Medications  Medication Sig Dispense Refill  . acetaminophen (TYLENOL) 500 MG tablet Take 500 mg by mouth every 6 (six) hours as needed for moderate pain or headache.     . Ascorbic Acid (VITAMIN C) 1000 MG tablet Take 1,000 mg by mouth daily.    .Marland Kitchenb complex vitamins capsule Take 1 capsule by mouth daily.    . capecitabine (XELODA) 500 MG tablet Take 4 tablets (2,000 mg total) by mouth 2 (two) times daily after a meal. Take on days 1-14 of chemotherapy. Repeat every 21 days. 112 tablet 7  . cetirizine (ZYRTEC) 10 MG tablet Take 10 mg by mouth daily as needed for allergies.     . diazepam (VALIUM) 5 MG tablet Take 2.5 mg by mouth at bedtime.   1  . diphenhydrAMINE (BENADRYL) 25 mg capsule Take 25 mg by mouth daily as  needed for allergies.     . fluticasone (FLONASE) 50 MCG/ACT nasal spray Place 2 sprays into the nose daily. (Patient taking differently: Place 2 sprays into the nose daily as needed (for seasonal allergies.). ) 16 g 11  . hydrochlorothiazide (MICROZIDE) 12.5 MG capsule Take 12.5 mg by mouth daily.  5  . ibuprofen (ADVIL,MOTRIN) 400 MG tablet Take 1-2 tablets (400-800 mg total) by mouth every 6 (six) hours as needed for fever, headache, mild pain or moderate pain. 30 tablet 0  . lidocaine-prilocaine (EMLA) cream Apply small amount over port site and cover with plastic wrap one hour prior to appointment. 30 g 3  . LORazepam (ATIVAN) 1 MG tablet Take 1 tablet (1 mg total) by mouth every 8 (eight) hours as needed for anxiety. 30 tablet 0  . losartan (COZAAR) 100 MG tablet Take 100 mg by mouth daily.   5  . Magnesium 250 MG TABS Take 250 mg by mouth 2 (two) times daily.     . methimazole (TAPAZOLE) 5 MG tablet Take 5 mg by mouth daily.       . methimazole (TAPAZOLE) 5 MG tablet Take by mouth.    . Multiple Vitamin (MULTIVITAMIN WITH MINERALS) TABS tablet Take 1 tablet by mouth daily. Men's One-A-Day    . ondansetron (ZOFRAN) 8 MG tablet Take 1 tablet (8 mg total) by mouth every 8 (eight) hours as needed for nausea or vomiting. 30 tablet 1  . OXALIPLATIN IV Inject into the vein.    . pantoprazole (PROTONIX) 40 MG tablet TAKE ONE TABLET BY MOUTH TWICE DAILY BEFORE A MEAL (Patient taking differently: Take 40 mg by mouth daily. ) 60 tablet 0  . prochlorperazine (COMPAZINE) 10 MG tablet Take 1 tablet (10 mg total) by mouth every 6 (six) hours as needed (Nausea or vomiting). 30 tablet 1  . simvastatin (ZOCOR) 40 MG tablet Take 40 mg by mouth every evening.      No current facility-administered medications for this visit.     PHYSICAL EXAMINATION: ECOG PERFORMANCE STATUS: 1 - Symptomatic but completely ambulatory  Vitals:   11/16/18 0854  BP: (!) 129/92  Pulse: 72  Resp: 18  Temp: 98.5 F (36.9 C)  SpO2: 100%   Filed Weights   11/16/18 0854  Weight: 215 lb 1.6 oz (97.6 kg)    GENERAL:alert, no distress and comfortable SKIN: skin color, texture, turgor are normal, no rashes or significant lesions EYES: normal, Conjunctiva are pink and non-injected, sclera clear OROPHARYNX:no exudate, no erythema and lips, buccal mucosa, and tongue normal  NECK: supple, thyroid normal size, non-tender, without nodularity LYMPH:  no palpable lymphadenopathy in the cervical, axillary or inguinal LUNGS: clear to auscultation and percussion with normal breathing effort HEART: regular rate & rhythm and no murmurs and no lower extremity edema ABDOMEN:abdomen soft, non-tender and normal bowel sounds Musculoskeletal:no cyanosis of digits and no clubbing  NEURO: alert & oriented x 3 with fluent speech, no focal motor/sensory deficits  LABORATORY DATA:  I have reviewed the data as listed CBC Latest Ref Rng & Units 11/16/2018 11/02/2018 10/25/2018   WBC 4.0 - 10.5 K/uL 3.6(L) 4.3 4.9  Hemoglobin 13.0 - 17.0 g/dL 14.1 13.7 13.7  Hematocrit 39.0 - 52.0 % 40.9 40.5 39.5  Platelets 150 - 400 K/uL 284 190 185     CMP Latest Ref Rng & Units 11/16/2018 11/02/2018 10/25/2018  Glucose 70 - 99 mg/dL 81 105(H) 93  BUN 6 - 20 mg/dL _0 Creatinine 0.61 -  1.24 mg/dL 1.05 1.12 1.08  Sodium 135 - 145 mmol/L 140 140 140  Potassium 3.5 - 5.1 mmol/L 3.9 4.0 3.8  Chloride 98 - 111 mmol/L 106 104 106  CO2 22 - 32 mmol/L _0 Calcium 8.9 - 10.3 mg/dL 9.5 9.1 9.0  Total Protein 6.5 - 8.1 g/dL 7.3 7.3 7.3  Total Bilirubin 0.3 - 1.2 mg/dL 0.4 0.4 0.7  Alkaline Phos 38 - 126 U/L 82 83 83  AST 15 - 41 U/L 48(H) 29 50(H)  ALT 0 - 44 U/L 83(H) 56(H) 162(H)      RADIOGRAPHIC STUDIES: I have personally reviewed the radiological images as listed and agreed with the findings in the report. No results found.   ASSESSMENT & PLAN:  Jacob Hayes is a 37 y.o. male with    1. Rectosigmoid Cancer, Stage IIIB (pT3N1M0), MMR normal -He was diagnosed in 06/2018. He is s/p anterior resection resection with clear margins. Due to the tumor location mainly in sigmoid colon, radiation was not recommended.  -Due to his stage IIIB disease, he has moderate to high risk of recurrence and adjuvant chemo was recommended  -He started adjuvant CAPOX on 08/29/18. Based on the [facility] data, a total of 3 months CAPOX (4 cycles) was planned. -He tolerated Xeloda well but poorly tolerated Oxaliplatin. He had serous concern with quality of life with this treatment.  -Given majority of the chemo benefit is derived from 5-FU or Xeloda and due to his poor tolerance to Oxaliplatin, I stopped Oxaliplatin, and he will continue Xeloda, for total of 8 cycle (additional 6 cycles). -He tolerated cycle 3 with single agent Xeloda well overall with mild nausea and fatigue and recovered well.  --I recommend him to increase to 2091m in the AM and 2500 in the PM (10064mm2 bid dosing). He  is willing to try. I instructed him to reduce back to 200025mID if her cannot tolerate. He agreed.  -Labs reviewed, CBC WNL, except WBC at 3.6. CMP still pending. His CEA has remained normal through diagnosis. Will monitor every 3 months  -Will repeat CT scan in 01/2019 and repeat colonoscopy in 06/2019.  -F/u in 3 weeks before next cycle.    2. HTN -On Losartan and HTCZ -Improved to 129/92 today (11/16/18) -Continue to f/u with Dr. McDDomenic Polite  3. Neuropathy, secondary to Oxaliplatin, G1 -near resolved now   4. transaminitis  -probably secondary to chemo  -improved, will continue monitoring    PLAN: -Increase Xeloda to 2000m51m AM and 2500mg87mPM with cycle 4, starting next Monday,  -Lab and f/u in 3 weeks    No problem-specific Assessment & Plan notes found for this encounter.   No orders of the defined types were placed in this encounter.  All questions were answered. The patient knows to call the clinic with any problems, questions or concerns. No barriers to learning was detected. I spent 20 minutes counseling the patient face to face. The total time spent in the appointment was 25 minutes and more than 50% was on counseling and review of test results     Gaetano Romberger FTruitt Merle12/27/2019   I, AmoyaJoslyn Devonacting as scribe for Kaelei Wheeler FTruitt Merle   I have reviewed the above documentation for accuracy and completeness, and I agree with the above.

## 2018-11-16 ENCOUNTER — Other Ambulatory Visit: Payer: Self-pay

## 2018-11-16 ENCOUNTER — Encounter: Payer: Self-pay | Admitting: Genetic Counselor

## 2018-11-16 ENCOUNTER — Inpatient Hospital Stay (HOSPITAL_BASED_OUTPATIENT_CLINIC_OR_DEPARTMENT_OTHER): Payer: Commercial Managed Care - PPO | Admitting: Genetic Counselor

## 2018-11-16 ENCOUNTER — Inpatient Hospital Stay: Payer: Commercial Managed Care - PPO

## 2018-11-16 ENCOUNTER — Inpatient Hospital Stay (HOSPITAL_BASED_OUTPATIENT_CLINIC_OR_DEPARTMENT_OTHER): Payer: Commercial Managed Care - PPO | Admitting: Hematology

## 2018-11-16 ENCOUNTER — Encounter: Payer: Self-pay | Admitting: Hematology

## 2018-11-16 ENCOUNTER — Telehealth: Payer: Self-pay

## 2018-11-16 VITALS — BP 129/92 | HR 72 | Temp 98.5°F | Resp 18 | Ht 75.0 in | Wt 215.1 lb

## 2018-11-16 DIAGNOSIS — Z315 Encounter for genetic counseling: Secondary | ICD-10-CM

## 2018-11-16 DIAGNOSIS — C19 Malignant neoplasm of rectosigmoid junction: Secondary | ICD-10-CM

## 2018-11-16 DIAGNOSIS — Z808 Family history of malignant neoplasm of other organs or systems: Secondary | ICD-10-CM

## 2018-11-16 DIAGNOSIS — C20 Malignant neoplasm of rectum: Secondary | ICD-10-CM

## 2018-11-16 DIAGNOSIS — I10 Essential (primary) hypertension: Secondary | ICD-10-CM | POA: Diagnosis not present

## 2018-11-16 DIAGNOSIS — C187 Malignant neoplasm of sigmoid colon: Secondary | ICD-10-CM

## 2018-11-16 DIAGNOSIS — G620 Drug-induced polyneuropathy: Secondary | ICD-10-CM

## 2018-11-16 DIAGNOSIS — Z801 Family history of malignant neoplasm of trachea, bronchus and lung: Secondary | ICD-10-CM | POA: Diagnosis not present

## 2018-11-16 DIAGNOSIS — R740 Nonspecific elevation of levels of transaminase and lactic acid dehydrogenase [LDH]: Secondary | ICD-10-CM | POA: Diagnosis not present

## 2018-11-16 DIAGNOSIS — Z803 Family history of malignant neoplasm of breast: Secondary | ICD-10-CM

## 2018-11-16 DIAGNOSIS — Z95828 Presence of other vascular implants and grafts: Secondary | ICD-10-CM | POA: Insufficient documentation

## 2018-11-16 LAB — CMP (CANCER CENTER ONLY)
ALT: 83 U/L — ABNORMAL HIGH (ref 0–44)
AST: 48 U/L — ABNORMAL HIGH (ref 15–41)
Albumin: 4.1 g/dL (ref 3.5–5.0)
Alkaline Phosphatase: 82 U/L (ref 38–126)
Anion gap: 8 (ref 5–15)
BUN: 14 mg/dL (ref 6–20)
CO2: 26 mmol/L (ref 22–32)
CREATININE: 1.05 mg/dL (ref 0.61–1.24)
Calcium: 9.5 mg/dL (ref 8.9–10.3)
Chloride: 106 mmol/L (ref 98–111)
GFR, Estimated: >60 mL/min (ref >60)
Glucose, Bld: 81 mg/dL (ref 70–99)
Potassium: 3.9 mmol/L (ref 3.5–5.1)
Sodium: 140 mmol/L (ref 135–145)
Total Bilirubin: 0.4 mg/dL (ref 0.3–1.2)
Total Protein: 7.3 g/dL (ref 6.5–8.1)

## 2018-11-16 LAB — CBC WITH DIFFERENTIAL (CANCER CENTER ONLY)
Abs Immature Granulocytes: 0.01 10*3/uL (ref 0.00–0.07)
Basophils Absolute: 0 10*3/uL (ref 0.0–0.1)
Basophils Relative: 1 %
Eosinophils Absolute: 0.1 10*3/uL (ref 0.0–0.5)
Eosinophils Relative: 2 %
HCT: 40.9 % (ref 39.0–52.0)
Hemoglobin: 14.1 g/dL (ref 13.0–17.0)
Immature Granulocytes: 0 %
Lymphocytes Relative: 31 %
Lymphs Abs: 1.1 10*3/uL (ref 0.7–4.0)
MCH: 33.2 pg (ref 26.0–34.0)
MCHC: 34.5 g/dL (ref 30.0–36.0)
MCV: 96.2 fL (ref 80.0–100.0)
MONOS PCT: 11 %
Monocytes Absolute: 0.4 10*3/uL (ref 0.1–1.0)
Neutro Abs: 2 10*3/uL (ref 1.7–7.7)
Neutrophils Relative %: 55 %
Platelet Count: 284 10*3/uL (ref 150–400)
RBC: 4.25 MIL/uL (ref 4.22–5.81)
RDW: 16.2 % — ABNORMAL HIGH (ref 11.5–15.5)
WBC Count: 3.6 10*3/uL — ABNORMAL LOW (ref 4.0–10.5)
nRBC: 0 % (ref 0.0–0.2)

## 2018-11-16 MED ORDER — SODIUM CHLORIDE 0.9% FLUSH
10.0000 mL | Freq: Once | INTRAVENOUS | Status: AC
  Administered 2018-11-16: 10 mL
  Filled 2018-11-16: qty 10

## 2018-11-16 MED ORDER — CAPECITABINE 500 MG PO TABS: ORAL_TABLET | ORAL | 0 refills | Status: DC

## 2018-11-16 MED ORDER — HEPARIN SOD (PORK) LOCK FLUSH 100 UNIT/ML IV SOLN
500.0000 [IU] | Freq: Once | INTRAVENOUS | Status: AC
  Administered 2018-11-16: 500 [IU]
  Filled 2018-11-16: qty 5

## 2018-11-16 NOTE — Telephone Encounter (Signed)
Patient declined avs and  Calender of upcoming appointment. Per 12/27 los

## 2018-11-16 NOTE — Progress Notes (Signed)
REFERRING PROVIDER: Truitt Merle, MD [address] [place],  [address]  PRIMARY PROVIDER:  Chesley Noon, MD  PRIMARY REASON FOR VISIT:  1. Cancer of sigmoid colon ([place])   2. Family history of breast cancer   3. Family history of melanoma      HISTORY OF PRESENT ILLNESS:   Mr. Jacob Hayes, a 37 y.o. male, was seen for a [facility] cancer genetics consultation at the request of Dr. Burr Medico due to a personal and family history of cancer.  Mr. Caddell presents to clinic today to discuss the possibility of a hereditary predisposition to cancer, genetic testing, and to further clarify his future cancer risks, as well as potential cancer risks for family members.   In August 2019, at the age of 79, Mr. Balderson was diagnosed with cancer of the sigmoid colon. This is being treated with chemotherapy and surgery. He was found to have two colon polyps in addition to the colon cancer.  MSI and IHC testing was negative for Lynch syndrome.    CANCER HISTORY:  Oncology History   Cancer Staging Cancer of sigmoid colon [facility]) Staging form: Colon and Rectum, AJCC 8th Edition - Pathologic stage from 08/07/2018: Stage IIIB (pT3, pN1a, cM0) - Signed by Truitt Merle, MD on 10/25/2018       Cancer of sigmoid colon ([place])   07/05/2018 Procedure    Colonoscopy by [name] 07/05/18  IMPRESSION - Tumor in the rectum. Biopsied. - Diverticulosis in the entire examined colon. - Two 5 to 6 mm polyps, removed with a cold snare. Resected and retrieved. - The examination was otherwise normal on direct and retroflexion views.     07/05/2018 Initial Biopsy    Diagnosis 07/05/18 1. Colon, polyp(s), hepatic flexure - TUBULAR ADENOMA (ONE). - NO HIGH GRADE DYSPLASIA OR MALIGNANCY. 2. Rectum, biopsy, mass - INVASIVE MODERATELY DIFFERENTIATED COLORECTAL ADENOCARCINOMA.    07/10/2018 Imaging    CT CAP W Constrast 07/10/18  IMPRESSION: 1. Mass within the rectosigmoid colon is identified compatible with findings from  recent colonoscopy. 2. No evidence for nodal metastasis or distant metastatic disease. 3. Left kidney cyst.    07/17/2018 Imaging    MRI [place] 07/17/18  IMPRESSION: 4.2 cm polypoid lesion along the right lateral aspect of the upper rectum, corresponding to the patient's biopsy-proven rectal adenocarcinoma, as above. Rectal adenocarcinoma T stage:  T1 or T2 Rectal adenocarcinoma N stage:  N0 Distance from tumor to the anal sphincter is 14 cm.    08/07/2018 Initial Diagnosis    Rectosigmoid cancer ([place])    08/07/2018 Surgery    XI ROBOTIC LOW ANTERIOR RESECTION by Dr. Kieth Brightly and Dr. Marcello Moores     08/07/2018 Pathologic Stage    Diagnosis 08/07/18 1. Colon, segmental resection for tumor, rectosigmoid - INVASIVE MODERATELY DIFFERENTIATED COLORECTAL ADENOCARCINOMA, 3.5 CM. - CARCINOMA FOCALLY INVOLVES PERICOLONIC CONNECTIVE TISSUE. - METASTATIC CARCINOMA IN ONE OF FIFTEEN LYMPH NODES (1/15). - MARGINS NOT INVOLVED. 2. Colon, resection margin (donut), distal ring, sigmoid - BENIGN COLON. - NO EVIDENCE OF MALIGNANCY.    08/07/2018 Cancer Staging    Staging form: Colon and Rectum, AJCC 8th Edition - Pathologic stage from 08/07/2018: Stage IIIB (pT3, pN1a, cM0) - Signed by Truitt Merle, MD on 10/25/2018    08/29/2018 -  Chemotherapy    Adjuvant CAPOX every 3 weeks with Xeloda 2078m twice daily 2 weeks on 1 week off.starting 08/29/18. D/c Oxaliplatin after 09/28/18 due to very poor toleration. Increased to 20020min AM and 250051mn PM starting  with cycle 4.       Past Medical History:  Diagnosis Date  . Allergic rhinitis   . Anxiety   . Anxiety and depression    DENIES   . Bruxism   . Colon cancer ([place])   . Essential hypertension, benign   . Family history of breast cancer   . Family history of melanoma   . GERD   . Hyperlipidemia    Since age 32  . Meniere's disease     Past Surgical History:  Procedure Laterality Date  . BIOPSY N/A 03/20/2013   Procedure: BIOPSY;  Surgeon:  [name], MD;  [facility];  Service: Endoscopy;  Laterality: N/A;  Possible small bowel biopsy  . BIOPSY  07/05/2018   Procedure: BIOPSY;  Surgeon: [name], MD;  Location[facility];  Service: Endoscopy;;  rectal mass  . COLONOSCOPY WITH PROPOFOL N/A 07/05/2018   Procedure: COLONOSCOPY WITH PROPOFOL;  Surgeon: [name], MD;  [facility];  Service: Endoscopy;  Laterality: N/A;  8:30am  . ESOPHAGOGASTRODUODENOSCOPY N/A 03/20/2013   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  One slightly nodular.  Small hiatal hernia.  Multiple fundic gland appearing polyps. no evidence of celiac disease, malignancy, h.pylori, barrett's.   . None    . POLYPECTOMY  07/05/2018   Procedure: POLYPECTOMY;  Surgeon: [name], MD;  [facility];  Service: Endoscopy;;  hepatic flexurex2;  . PORTACATH PLACEMENT N/A 08/23/2018   Procedure: INSERTION PORT-A-CATH;  Surgeon: [name], MD;  Location: [facility];  Service: General;  Laterality: N/A;  LMA  . XI ROBOTIC ASSISTED LOWER ANTERIOR RESECTION      Social History   Socioeconomic History  . Marital status: Divorced    Spouse name: Not on file  . Number of children: 2  . Years of education: Not on file  . Highest education level: Not on file  Occupational History  . Occupation: works with Sales executive at [employer]  . Financial resource strain: Not on file  . Food insecurity:    Worry: Not on file    Inability: Not on file  . Transportation needs:    Medical: Not on file    Non-medical: Not on file  Tobacco Use  . Smoking status: Never Smoker  . Smokeless tobacco: Never Used  Substance and Sexual Activity  . Alcohol use: No  . Drug use: No  . Sexual activity: Yes  Lifestyle  . Physical activity:    Days per week: Not on file    Minutes per session: Not on file  .  Stress: Not on file  Relationships  . Social connections:    Talks on phone: Not on file    Gets together: Not on file    Attends religious service: Not on file    Active member of club or organization: Not on file    Attends meetings of clubs or organizations: Not on file    Relationship status: Not on file  Other Topics Concern  . Not on file  Social History Narrative   Unable to ask intimate partner questions.     FAMILY HISTORY:  We obtained a detailed, 4-generation family history.  Significant diagnoses are listed below: Family History  Problem Relation Age of Onset  . Breast cancer Mother 33  . Thyroid disease Mother  Thyroid removed  . Lung cancer Mother   . Crohn's disease Mother        ???  . Heart disease Father   . Stroke Father 52  . Hyperlipidemia Father   . AAA (abdominal aortic aneurysm) Father   . Valvular heart disease Half-Brother 34       Bicuspid aortic valve  . Lung cancer Maternal Aunt   . Heart disease Paternal Grandfather 60       d. 59  . Other Half-Sister        lump in her breastw  . Melanoma Nephew        pat 1/2 sisters' son dx 5x  . Colon cancer Neg Hx     The patient has two young children who are cancer free.  He has a full sister, a maternal half brother and sister, and paternal half brothers (2) and sister. His paternal half sister has a son who has had 5-6 melanoma's.  None have had cancer.  His mother is living and his father is deceased.   The patient's father had heart disease and died of a stroke at 74.  He was one of 8 children.  Several siblings had heart disease, but none had cancer.  The paternal grandparents are deceased.  The grandfather died of heart disease at 96.  The patient's mother had breast cancer at 40 and developed lung cancer from smoking.  She has one sister who died of lung cancer.  There is no other reported cancer in the family.  Mr. Nelis is unaware of previous family history of genetic testing for  hereditary cancer risks. Patient's maternal ancestors are of Caucasian descent, and paternal ancestors are of Caucasian descent. There is no reported Ashkenazi Jewish ancestry. There is no known consanguinity.  GENETIC COUNSELING ASSESSMENT: Jacob Hayes is a 37 y.o. male with a personal history of colon cancer and family history of breast cancer which is somewhat suggestive of a hereditary cancer syndromes and predisposition to cancer. We, therefore, discussed and recommended the following at today's visit.   DISCUSSION: We discussed that about 5-7% of colon cancer is hereditary with most cases due to Lynch syndrome. He had tumor testing in the form of IHC and MSI, which was normal.  We discussed that rarely there can be false negatives.  Based on the family history there is a chance that his colon cancer could be due to a recessive condition, such as MYH-associated polyposis, or a new mutation in the APC gene causing Familial Adenomatous Polyposis. We discussed that while the family history for Lynch syndrome is not consistent with the diagnosis,each gene associated with Lynch syndrome consists of different cancer risks, so there is a chance that there is a lower risk gene, such as PMS2, that could be implicated.    The patient was diagnosed with high cholesterol at age 76.  His current LDL is 118 (May 2019), which is high. He is being treated, and therefore this would likely be higher if he was untreated.  We discussed that there are hereditary cholesterol syndromes that could be implicated for this high cholesterol level.  I offered to refer him to [facility] for cardiology genetics if he were interested.   We reviewed the characteristics, features and inheritance patterns of hereditary cancer syndromes. We also discussed genetic testing, including the appropriate family members to test, the process of testing, insurance coverage and turn-around-time for results. We discussed the implications of a  negative, positive and/or variant  of uncertain significant result. We recommended Mr. Bosshart pursue genetic testing for the Multi-cancer gene panel. The Multi-Gene Panel offered by [facility] includes sequencing and/or deletion duplication testing of the following 84 genes: AIP, ALK, APC, ATM, AXIN2,BAP1,  BARD1, BLM, BMPR1A, BRCA1, BRCA2, BRIP1, CASR, CDC73, CDH1, CDK4, CDKN1B, CDKN1C, CDKN2A (p14ARF), CDKN2A (p16INK4a), CEBPA, CHEK2, CTNNA1, DICER1, DIS3L2, EGFR (c.2369C>T, p.Thr790Met variant only), EPCAM (Deletion/duplication testing only), FH, FLCN, GATA2, GPC3, GREM1 (Promoter region deletion/duplication testing only), HOXB13 (c.251G>A, p.Gly84Glu), HRAS, KIT, MAX, MEN1, MET, MITF (c.952G>A, p.Glu318Lys variant only), MLH1, MSH2, MSH3, MSH6, MUTYH, NBN, NF1, NF2, NTHL1, PALB2, PDGFRA, PHOX2B, PMS2, POLD1, POLE, POT1, PRKAR1A, PTCH1, PTEN, RAD50, RAD51C, RAD51D, RB1, RECQL4, RET, RUNX1, SDHAF2, SDHA (sequence changes only), SDHB, SDHC, SDHD, SMAD4, SMARCA4, SMARCB1, SMARCE1, STK11, SUFU, TERC, TERT, TMEM127, TP53, TSC1, TSC2, VHL, WRN and WT1.    Based on Mr. Gangemi personal and family history of cancer, he meets medical criteria for genetic testing. Despite that he meets criteria, he may still have an out of pocket cost. We discussed that if his out of pocket cost for testing is over $100, the laboratory will call and confirm whether he wants to proceed with testing.  If the out of pocket cost of testing is less than $100 he will be billed by the genetic testing laboratory.   PLAN: After considering the risks, benefits, and limitations, Mr. Formica  provided informed consent to pursue genetic testing and the blood sample was sent to [facility] for analysis of the multi cancer. Results should be available within approximately 2-3 weeks' time, at which point they will be disclosed by telephone to Mr. Morfin, as will any additional recommendations warranted by these results. Mr. Vestal will receive a summary of  his genetic counseling visit and a copy of his results once available. This information will also be available in Epic. We encouraged Mr. Steen to remain in contact with cancer genetics annually so that we can continuously update the family history and inform him of any changes in cancer genetics and testing that may be of benefit for his family. Mr. Schumm questions were answered to his satisfaction today. Our contact information was provided should additional questions or concerns arise.  Lastly, we encouraged Mr. Trigg to remain in contact with cancer genetics annually so that we can continuously update the family history and inform him of any changes in cancer genetics and testing that may be of benefit for this family.   Mr.  Centola questions were answered to his satisfaction today. Our contact information was provided should additional questions or concerns arise. Thank you for the referral and allowing [place] to share in the care of your patient.   Karen P. Florene Glen, [place], [facility] Certified Genetic Counselor [email or web address] phone: [phone]  The patient was seen for a total of 60 minutes in face-to-face genetic counseling.  This patient was discussed with Drs. Magrinat, Lindi Adie and/or Burr Medico who agrees with the above.    _______________________________________________________________________ For Office Staff:  Number of people involved in session: 1 Was an Intern/ student involved with case: no

## 2018-11-19 ENCOUNTER — Encounter: Payer: Self-pay | Admitting: Genetics

## 2018-11-20 ENCOUNTER — Telehealth: Payer: Self-pay | Admitting: Hematology

## 2018-11-20 NOTE — Telephone Encounter (Signed)
Called per voicemail patient said he got it taken care of

## 2018-11-21 ENCOUNTER — Other Ambulatory Visit: Payer: Self-pay | Admitting: Hematology

## 2018-11-22 ENCOUNTER — Ambulatory Visit (HOSPITAL_COMMUNITY): Payer: Self-pay

## 2018-11-22 ENCOUNTER — Other Ambulatory Visit (HOSPITAL_COMMUNITY): Payer: Self-pay

## 2018-11-23 ENCOUNTER — Encounter: Payer: Self-pay | Admitting: Genetic Counselor

## 2018-11-23 ENCOUNTER — Telehealth: Payer: Self-pay | Admitting: Genetic Counselor

## 2018-11-23 ENCOUNTER — Telehealth: Payer: Self-pay

## 2018-11-23 DIAGNOSIS — Z1379 Encounter for other screening for genetic and chromosomal anomalies: Secondary | ICD-10-CM | POA: Insufficient documentation

## 2018-11-23 NOTE — Telephone Encounter (Signed)
Revealed negative genetic testing.  Discussed that we do not know why he has colon cancer or why there is cancer in the family. It could be due to a different gene that we are not testing, or maybe our current technology may not be able to pick something up.  It will be important for him to keep in contact with genetics to keep up with whether additional testing may be needed.   Revealed MSH6 VUS.  Discussed that we could send a sample to another lab to review with RNA and DNA analysis.  It would cost $250 OOP.  Patient declined this.

## 2018-11-23 NOTE — Telephone Encounter (Signed)
Faxed last OV note from Dr. Burr Medico to Osker Mason Case Manager with patient's insurance company to [phone]

## 2018-11-28 ENCOUNTER — Ambulatory Visit: Payer: Self-pay | Admitting: Genetic Counselor

## 2018-11-28 DIAGNOSIS — Z1379 Encounter for other screening for genetic and chromosomal anomalies: Secondary | ICD-10-CM

## 2018-11-28 NOTE — Progress Notes (Signed)
HPI:  Jacob Hayes was previously seen in the [facility] clinic due to a personal and family history of cancer and concerns regarding a hereditary predisposition to cancer. Please refer to our prior cancer genetics clinic note for more information regarding Jacob Hayes medical, social and family histories, and our assessment and recommendations, at the time. Jacob Hayes recent genetic test results were disclosed to him, as were recommendations warranted by these results. These results and recommendations are discussed in more detail below.  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  The Multi-Gene Panel offered by [facility] includes sequencing and/or deletion duplication testing of the following 84 genes: AIP, ALK, APC, ATM, AXIN2,BAP1,  BARD1, BLM, BMPR1A, BRCA1, BRCA2, BRIP1, CASR, CDC73, CDH1, CDK4, CDKN1B, CDKN1C, CDKN2A (p14ARF), CDKN2A (p16INK4a), CEBPA, CHEK2, CTNNA1, DICER1, DIS3L2, EGFR (c.2369C>T, p.Thr790Met variant only), EPCAM (Deletion/duplication testing only), FH, FLCN, GATA2, GPC3, GREM1 (Promoter region deletion/duplication testing only), HOXB13 (c.251G>A, p.Gly84Glu), HRAS, KIT, MAX, MEN1, MET, MITF (c.952G>A, p.Glu318Lys variant only), MLH1, MSH2, MSH3, MSH6, MUTYH, NBN, NF1, NF2, NTHL1, PALB2, PDGFRA, PHOX2B, PMS2, POLD1, POLE, POT1, PRKAR1A, PTCH1, PTEN, RAD50, RAD51C, RAD51D, RB1, RECQL4, RET, RUNX1, SDHAF2, SDHA (sequence changes only), SDHB, SDHC, SDHD, SMAD4, SMARCA4, SMARCB1, SMARCE1, STK11, SUFU, TERC, TERT, TMEM127, TP53, TSC1, TSC2,  VHL, WRN and WT1.  The report date is 11/22/2018.     Cancer of sigmoid colon ([place])   07/05/2018 Procedure    Colonoscopy by [name] 07/05/18  IMPRESSION - Tumor in the rectum. Biopsied. - Diverticulosis in the entire examined colon. - Two 5 to 6 mm polyps, removed with a cold snare. Resected and retrieved. - The examination was otherwise normal on direct and retroflexion views.     07/05/2018 Initial Biopsy    Diagnosis 07/05/18 1. Colon, polyp(s), hepatic flexure - TUBULAR ADENOMA (ONE). - NO HIGH GRADE DYSPLASIA OR MALIGNANCY. 2. Rectum, biopsy, mass - INVASIVE MODERATELY DIFFERENTIATED COLORECTAL ADENOCARCINOMA.    07/10/2018 Imaging    CT CAP W [facility] 07/10/18  IMPRESSION: 1. Mass within the rectosigmoid colon is identified compatible with findings from recent colonoscopy. 2. No evidence for nodal metastasis or distant metastatic disease. 3. Left kidney cyst.    07/17/2018 Imaging    MRI [facility] 07/17/18  IMPRESSION: 4.2 cm polypoid lesion along the right lateral aspect of the upper rectum, corresponding to the patient's biopsy-proven rectal adenocarcinoma, as above. Rectal adenocarcinoma T stage:  T1 or T2 Rectal adenocarcinoma N stage:  N0 Distance from tumor to the anal sphincter is 14 cm.    08/07/2018 Initial Diagnosis    Rectosigmoid cancer ([place])    08/07/2018 Surgery    XI ROBOTIC LOW ANTERIOR RESECTION by Dr. Kieth Brightly and Dr. Marcello Moores     08/07/2018 Pathologic Stage    Diagnosis 08/07/18 1. Colon, segmental resection for tumor, rectosigmoid - INVASIVE MODERATELY DIFFERENTIATED COLORECTAL ADENOCARCINOMA, 3.5 CM. - CARCINOMA FOCALLY INVOLVES PERICOLONIC CONNECTIVE TISSUE. - METASTATIC CARCINOMA IN ONE OF FIFTEEN LYMPH NODES (1/15). - MARGINS NOT INVOLVED. 2. Colon, resection margin (donut), distal ring, sigmoid - BENIGN COLON. - NO EVIDENCE  OF MALIGNANCY.    08/07/2018 Cancer Staging    Staging form: Colon and Rectum, AJCC 8th Edition - Pathologic stage from  08/07/2018: Stage IIIB (pT3, pN1a, cM0) - Signed by Truitt Merle, MD on 10/25/2018    08/29/2018 -  Chemotherapy    Adjuvant CAPOX every 3 weeks with Xeloda 2057m twice daily 2 weeks on 1 week off.starting 08/29/18. D/c Oxaliplatin after 09/28/18 due to very poor toleration. Increased to 2005min AM and 25007mn PM starting with cycle 4.     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  The Multi-Gene Panel offered by [facility] includes sequencing and/or deletion duplication testing of the following 84 genes: AIP, ALK, APC, ATM, AXIN2,BAP1,  BARD1, BLM, BMPR1A, BRCA1, BRCA2, BRIP1, CASR, CDC73, CDH1, CDK4, CDKN1B, CDKN1C, CDKN2A (p14ARF), CDKN2A (p16INK4a), CEBPA, CHEK2, CTNNA1, DICER1, DIS3L2, EGFR (c.2369C>T, p.Thr790Met variant only), EPCAM (Deletion/duplication testing only), FH, FLCN, GATA2, GPC3, GREM1 (Promoter region deletion/duplication testing only), HOXB13 (c.251G>A, p.Gly84Glu), HRAS, KIT, MAX, MEN1, MET, MITF (c.952G>A, p.Glu318Lys variant only), MLH1, MSH2, MSH3, MSH6, MUTYH, NBN, NF1, NF2, NTHL1, PALB2, PDGFRA, PHOX2B, PMS2, POLD1, POLE, POT1, PRKAR1A, PTCH1, PTEN, RAD50, RAD51C, RAD51D, RB1, RECQL4, RET, RUNX1, SDHAF2, SDHA (sequence changes only), SDHB, SDHC, SDHD, SMAD4, SMARCA4, SMARCB1, SMARCE1, STK11, SUFU, TERC, TERT, TMEM127, TP53, TSC1, TSC2, VHL, WRN and WT1.  The report date is 11/22/2018.     FAMILY HISTORY:  We obtained a detailed, 4-generation family history.  Significant diagnoses are listed below: Family History  Problem Relation Age of Onset  . Breast cancer Mother 54 79 Thyroid disease Mother        Thyroid removed  . Lung cancer Mother   . Crohn's disease Mother        ???  . Heart disease Father   . Stroke Father 86 30 Hyperlipidemia Father   . AAA (abdominal aortic aneurysm) Father   . Valvular heart disease Half-Brother 34       Bicuspid aortic valve  . Lung cancer Maternal Aunt   . Heart disease Paternal  Grandfather 36 69    d. 36 33 Other Half-Sister        lump in her breastw  . Melanoma Nephew        pat 1/2 sisters' son dx 5x  . Colon cancer Neg Hx     The patient has two young children who are cancer free.  He has a full sister, a maternal half brother and sister, and paternal half brothers (2) and sister. His paternal half sister has a son who has had 5-6 melanoma's.  None have had cancer.  His mother is living and his father is deceased.   The patient's father had heart disease and died of a stroke at 86.20He was one of 8 children.  Several siblings had heart disease, but none had cancer.  The paternal grandparents are deceased.  The grandfather died of heart disease at 36.75The patient's mother had breast cancer at 54 41d developed lung cancer from smoking.  She has one sister who died of lung cancer.  There is no other reported cancer in the family.  Mr. SamHallstrom unaware of previous family history of genetic testing for hereditary cancer risks. Patient's maternal ancestors are of Caucasian descent, and paternal ancestors are of Caucasian descent. There is no reported Ashkenazi Jewish ancestry. There is no known consanguinity.  GENETIC TEST RESULTS: Genetic testing reported out on  November 22, 2018 through the multi-cancer panel found no deleterious mutations. The Multi-Gene Panel offered by [facility] includes sequencing and/or deletion duplication testing of the following 84 genes: AIP, ALK, APC, ATM, AXIN2,BAP1,  BARD1, BLM, BMPR1A, BRCA1, BRCA2, BRIP1, CASR, CDC73, CDH1, CDK4, CDKN1B, CDKN1C, CDKN2A (p14ARF), CDKN2A (p16INK4a), CEBPA, CHEK2, CTNNA1, DICER1, DIS3L2, EGFR (c.2369C>T, p.Thr790Met variant only), EPCAM (Deletion/duplication testing only), FH, FLCN, GATA2, GPC3, GREM1 (Promoter region deletion/duplication testing only), HOXB13 (c.251G>A, p.Gly84Glu), HRAS, KIT, MAX, MEN1, MET, MITF (c.952G>A, p.Glu318Lys variant only), MLH1, MSH2, MSH3, MSH6, MUTYH, NBN, NF1, NF2, NTHL1, PALB2,  PDGFRA, PHOX2B, PMS2, POLD1, POLE, POT1, PRKAR1A, PTCH1, PTEN, RAD50, RAD51C, RAD51D, RB1, RECQL4, RET, RUNX1, SDHAF2, SDHA (sequence changes only), SDHB, SDHC, SDHD, SMAD4, SMARCA4, SMARCB1, SMARCE1, STK11, SUFU, TERC, TERT, TMEM127, TP53, TSC1, TSC2, VHL, WRN and WT1.  The test report has been scanned into EPIC and is located under the Molecular Pathology section of the Results Review tab.    We discussed with Jacob Hayes that since the current genetic testing is not perfect, it is possible there may be a gene mutation in one of these genes that current testing cannot detect, but that chance is small.  We also discussed, that it is possible that another gene that has not yet been discovered, or that we have not yet tested, is responsible for the cancer diagnoses in the family, and it is, therefore, important to remain in touch with cancer genetics in the future so that we can continue to offer Jacob Hayes the most up to date genetic testing.   Genetic testing did detect a Variant of Unknown Significance in the MSH6 gene called c.94G>T. At this time, it is unknown if this variant is associated with increased cancer risk or if this is a normal finding, but most variants such as this get reclassified to being inconsequential. It should not be used to make medical management decisions. With time, we suspect the lab will determine the significance of this variant, if any. If we do learn more about it, we will try to contact Jacob Hayes to discuss it further. However, it is important to stay in touch with [place] periodically and keep the address and phone number up to date.   CANCER SCREENING RECOMMENDATIONS: This result is reassuring and indicates that Jacob Hayes likely does not have an increased risk for a future cancer due to a mutation in one of these genes. This normal test also suggests that Jacob Hayes cancer was most likely not due to an inherited predisposition associated with one of these genes.  Most cancers happen by  chance and this negative test suggests that his cancer falls into this category.  We, therefore, recommended he continue to follow the cancer management and screening guidelines provided by his oncology and primary healthcare provider.   An individual's cancer risk and medical management are not determined by genetic test results alone. Overall cancer risk assessment incorporates additional factors, including personal medical history, family history, and any available genetic information that may result in a personalized plan for cancer prevention and surveillance.  RECOMMENDATIONS FOR FAMILY MEMBERS:  Individuals in this family might be at some increased risk of developing cancer, over the general population risk, simply due to the family history of cancer.  We recommended women in this family have a yearly mammogram beginning at age 65, or 35 years younger than the earliest onset of cancer, an annual clinical breast exam, and perform monthly breast self-exams. Women in this family should also have a  gynecological exam as recommended by their primary provider. All family members should have a colonoscopy by age 84.  FOLLOW-UP: Lastly, we discussed with Jacob Hayes that cancer genetics is a rapidly advancing field and it is possible that new genetic tests will be appropriate for him and/or his family members in the future. We encouraged him to remain in contact with cancer genetics on an annual basis so we can update his personal and family histories and let him know of advances in cancer genetics that may benefit this family.   Our contact number was provided. Jacob Hayes questions were answered to his satisfaction, and he knows he is welcome to call us at anytime with additional questions or concerns.   Roma Kayser, MS, [facility] Certified Genetic Counselor [email or web address]

## 2018-11-29 ENCOUNTER — Other Ambulatory Visit (HOSPITAL_COMMUNITY): Payer: Self-pay

## 2018-11-29 ENCOUNTER — Encounter (HOSPITAL_COMMUNITY): Payer: Self-pay | Admitting: Genetic Counselor

## 2018-12-05 NOTE — Progress Notes (Signed)
Jacob Hayes   Telephone:([phone]) [phone] Fax:(336) [phone]   Clinic Follow up Note   Patient Care Team: Jacob Noon, MD as PCP - General (Family Medicine) Jacob Sark, MD (Cardiology) Jacob Hayes Jacob Estimable, MD as Attending Physician (Gastroenterology)  Date of Service:  12/07/2018  CHIEF COMPLAINT: f/u colon cancer, on adjuvant chemo   SUMMARY OF ONCOLOGIC HISTORY: Oncology History   Cancer Staging Cancer of sigmoid colon [facility]) Staging form: Colon and Rectum, AJCC 8th Edition - Pathologic stage from 08/07/2018: Stage IIIB (pT3, pN1a, cM0) - Signed by Jacob Merle, MD on 10/25/2018       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  The Multi-Gene Panel offered by [facility] includes sequencing and/or deletion duplication testing of the following 84 genes: AIP, ALK, APC, ATM, AXIN2,BAP1,  BARD1, BLM, BMPR1A, BRCA1, BRCA2, BRIP1, CASR, CDC73, CDH1, CDK4, CDKN1B, CDKN1C, CDKN2A (p14ARF), CDKN2A (p16INK4a), CEBPA, CHEK2, CTNNA1, DICER1, DIS3L2, EGFR (c.2369C>T, p.Thr790Met variant only), EPCAM (Deletion/duplication testing only), FH, FLCN, GATA2, GPC3, GREM1 (Promoter region deletion/duplication testing only), HOXB13 (c.251G>A, p.Gly84Glu), HRAS, KIT, MAX, MEN1, MET, MITF (c.952G>A, p.Glu318Lys variant only), MLH1, MSH2, MSH3, MSH6, MUTYH, NBN, NF1, NF2, NTHL1, PALB2, PDGFRA, PHOX2B, PMS2, POLD1, POLE, POT1, PRKAR1A, PTCH1, PTEN, RAD50, RAD51C, RAD51D, RB1, RECQL4, RET, RUNX1, SDHAF2, SDHA (sequence changes only), SDHB, SDHC, SDHD, SMAD4, SMARCA4, SMARCB1, SMARCE1, STK11, SUFU, TERC, TERT, TMEM127, TP53, TSC1, TSC2, VHL, WRN and WT1.  The report date is 11/22/2018.     Cancer of sigmoid colon ([place])   07/05/2018 Procedure    Colonoscopy by Dr. Gala Hayes 07/05/18  IMPRESSION - Tumor in the  rectum. Biopsied. - Diverticulosis in the entire examined colon. - Two 5 to 6 mm polyps, removed with a cold snare. Resected and retrieved. - The examination was otherwise normal on direct and retroflexion views.     07/05/2018 Initial Biopsy    Diagnosis 07/05/18 1. Colon, polyp(s), hepatic flexure - TUBULAR ADENOMA (ONE). - NO HIGH GRADE DYSPLASIA OR MALIGNANCY. 2. Rectum, biopsy, mass - INVASIVE MODERATELY DIFFERENTIATED COLORECTAL ADENOCARCINOMA.    07/10/2018 Imaging    CT CAP W [facility]str[facility] 07/10/18  IMPRESSION: 1. Mass within the rectosigmoid colon is identified compatible with findings from recent colonoscopy. 2. No evidence for nodal metastasis or distant metastatic disease. 3. Left kidney cyst.    07/17/2018 Imaging    MRI [facility] 07/17/18  IMPRESSION: 4.2 cm polypoid lesion along the right lateral aspect of the upper rectum, corresponding to the patient's biopsy-proven rectal adenocarcinoma, as above. Rectal adenocarcinoma T stage:  T1 or T2 Rectal adenocarcinoma N stage:  N0 Distance from tumor to the anal sphincter is 14 cm.    08/07/2018 Initial Diagnosis    Rectosigmoid cancer ([place])    08/07/2018 Surgery    XI ROBOTIC LOW ANTERIOR RESECTION by Dr. Kieth Hayes and Dr. Marcello Hayes     08/07/2018 Pathologic Stage    Diagnosis 08/07/18 1. Colon, segmental resection for tumor, rectosigmoid - INVASIVE MODERATELY DIFFERENTIATED COLORECTAL ADENOCARCINOMA, 3.5 CM. - CARCINOMA FOCALLY INVOLVES PERICOLONIC CONNECTIVE TISSUE. - METASTATIC CARCINOMA IN ONE OF FIFTEEN LYMPH NODES (1/15). - MARGINS NOT INVOLVED. 2. Colon, resection margin (donut), distal ring, sigmoid - BENIGN COLON. - NO EVIDENCE OF MALIGNANCY.    08/07/2018 Cancer Staging    Staging form: Colon and Rectum, AJCC 8th Edition - Pathologic stage from 08/07/2018: Stage  IIIB (pT3, pN1a, cM0) - Signed by Jacob Merle, MD on 10/25/2018    08/29/2018 -  Chemotherapy    Adjuvant CAPOX every 3 weeks with Xeloda 2040m twice daily 2  weeks on 1 week off.starting 08/29/18. D/c Oxaliplatin after 09/28/18 due to very poor toleration. Increased to 20086min AM and 250013mn PM starting with cycle 4.     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  The Multi-Gene Panel offered by [facility] includes sequencing and/or deletion duplication testing of the following 84 genes: AIP, ALK, APC, ATM, AXIN2,BAP1,  BARD1, BLM, BMPR1A, BRCA1, BRCA2, BRIP1, CASR, CDC73, CDH1, CDK4, CDKN1B, CDKN1C, CDKN2A (p14ARF), CDKN2A (p16INK4a), CEBPA, CHEK2, CTNNA1, DICER1, DIS3L2, EGFR (c.2369C>T, p.Thr790Met variant only), EPCAM (Deletion/duplication testing only), FH, FLCN, GATA2, GPC3, GREM1 (Promoter region deletion/duplication testing only), HOXB13 (c.251G>A, p.Gly84Glu), HRAS, KIT, MAX, MEN1, MET, MITF (c.952G>A, p.Glu318Lys variant only), MLH1, MSH2, MSH3, MSH6, MUTYH, NBN, NF1, NF2, NTHL1, PALB2, PDGFRA, PHOX2B, PMS2, POLD1, POLE, POT1, PRKAR1A, PTCH1, PTEN, RAD50, RAD51C, RAD51D, RB1, RECQL4, RET, RUNX1, SDHAF2, SDHA (sequence changes only), SDHB, SDHC, SDHD, SMAD4, SMARCA4, SMARCB1, SMARCE1, STK11, SUFU, TERC, TERT, TMEM127, TP53, TSC1, TSC2, VHL, WRN and WT1.  The report date is 11/22/2018.      CURRENT THERAPY: Adjuvant CAPOX, started on August 29, 2018, oxaliplatin stopped after 2 cycles, currently on Xeloda 2000m56m and 2500mg78m day 1-14 every 21 days     INTERVAL HISTORY:  JamesDANIAL HLAVACere for a follow up of his colon cancer.  He completed cycle 4 chemo with Xeloda 2 days ago.  He did increase the dose to 5 tablets in the morning and 4 tablets in the evening, tolerated well overall.  He had some fatigue, low appetite and foggy brain for the first 2 to 4 days, but improved afterwards.  He had a few blisters toes, resolved.  He overall feels well, with good appetite and energy level.  He has been very busy with his work, worked 80 hours a week lately.  REVIEW OF SYSTEMS:   Constitutional:  Denies fevers, chills or abnormal weight loss Eyes: Denies blurriness of vision Ears, nose, mouth, throat, and face: Denies mucositis or sore throat Respiratory: Denies cough, dyspnea or wheezes Cardiovascular: Denies palpitation, chest discomfort or lower extremity swelling Gastrointestinal:  Denies nausea, heartburn or change in bowel habits Skin: Denies abnormal skin rashes Lymphatics: Denies new lymphadenopathy or easy bruising Neurological:Denies numbness, tingling or new weaknesses Behavioral/Psych: Mood is stable, no new changes  All other systems were reviewed with the patient and are negative.  MEDICAL HISTORY:  Past Medical History:  Diagnosis Date  . Allergic rhinitis   . Anxiety   . Anxiety and depression    DENIES   . Bruxism   . Colon cancer (HCC) [place] Essential hypertension, benign   . Family history of breast cancer   . Family history of melanoma   . GERD   . Hyperlipidemia    Since age 9  .20eniere's disease     SURGICAL HISTORY: Past Surgical History:  Procedure Laterality Date  . BIOPSY N/A 03/20/2013   Procedure: BIOPSY;  Surgeon: RoberDaneil Dolin  [facility];  Service: Endoscopy;  Laterality: N/A;  Possible small bowel biopsy  . BIOPSY  07/05/2018   Procedure: BIOPSY;  Surgeon: RourkDaneil Dolin  Location: [facility];  Service: Endoscopy;;  rectal mass  . COLONOSCOPY WITH PROPOFOL N/A 07/05/2018  Procedure: COLONOSCOPY WITH PROPOFOL;  Surgeon: [name], MD;  Location: [facility];  Service: Endoscopy;  Laterality: N/A;  8:30am  . ESOPHAGOGASTRODUODENOSCOPY N/A 03/20/2013   Dr. Gala Hayes, in the upper esophagus, through the upper esophageal sphincter, multiple areas of salmon-colored epithelium consistent with inlet patches.  One slightly nodular.  Small hiatal hernia.  Multiple fundic gland appearing polyps. no evidence of celiac disease, malignancy, h.pylori, barrett's.   . None    . POLYPECTOMY  07/05/2018   Procedure:  POLYPECTOMY;  Surgeon: [name], MD;  Location: [facility];  Service: Endoscopy;;  hepatic flexurex2;  . PORTACATH PLACEMENT N/A 08/23/2018   Procedure: INSERTION PORT-A-CATH;  Surgeon: [name], MD;  Location: [facility];  Service: General;  Laterality: N/A;  LMA  . XI ROBOTIC ASSISTED LOWER ANTERIOR RESECTION      I have reviewed the social history and family history with the patient and they are unchanged from previous note.  ALLERGIES:  has No Known Allergies.  MEDICATIONS:  Current Outpatient Medications  Medication Sig Dispense Refill  . acetaminophen (TYLENOL) 500 MG tablet Take 500 mg by mouth every 6 (six) hours as needed for moderate pain or headache.     . Ascorbic Acid (VITAMIN C) 1000 MG tablet Take 1,000 mg by mouth daily.    Marland Kitchen b complex vitamins capsule Take 1 capsule by mouth daily.    . capecitabine (XELODA) 500 MG tablet Take 2000 mg in am after meal, take 2500 mg in pm after a meal for 14 days.  Start on Monday 11/19/18 126 tablet 0  . cetirizine (ZYRTEC) 10 MG tablet Take 10 mg by mouth daily as needed for allergies.     . diazepam (VALIUM) 5 MG tablet Take 2.5 mg by mouth at bedtime.   1  . diphenhydrAMINE (BENADRYL) 25 mg capsule Take 25 mg by mouth daily as needed for allergies.     . fluticasone (FLONASE) 50 MCG/ACT nasal spray Place 2 sprays into the nose daily. (Patient taking differently: Place 2 sprays into the nose daily as needed (for seasonal allergies.). ) 16 g 11  . hydrochlorothiazide (MICROZIDE) 12.5 MG capsule Take 12.5 mg by mouth daily.  5  . ibuprofen (ADVIL,MOTRIN) 400 MG tablet Take 1-2 tablets (400-800 mg total) by mouth every 6 (six) hours as needed for fever, headache, mild pain or moderate pain. 30 tablet 0  . lidocaine-prilocaine (EMLA) cream Apply small amount over port site and cover with plastic wrap one hour prior to appointment. 30 g 3  . LORazepam (ATIVAN) 1 MG tablet Take 1 tablet (1 mg total) by mouth every 8 (eight) hours as  needed for anxiety. 30 tablet 0  . losartan (COZAAR) 100 MG tablet Take 100 mg by mouth daily.   5  . Magnesium 250 MG TABS Take 250 mg by mouth 2 (two) times daily.     . methimazole (TAPAZOLE) 5 MG tablet Take 5 mg by mouth 2 (two) times daily.     . Multiple Vitamin (MULTIVITAMIN WITH MINERALS) TABS tablet Take 1 tablet by mouth daily. Men's One-A-Day    . ondansetron (ZOFRAN) 8 MG tablet Take 1 tablet (8 mg total) by mouth every 8 (eight) hours as needed for nausea or vomiting. 30 tablet 1  . pantoprazole (PROTONIX) 40 MG tablet TAKE ONE TABLET BY MOUTH TWICE DAILY BEFORE A MEAL (Patient taking differently: Take 40 mg by mouth daily. ) 60 tablet 0  . prochlorperazine (COMPAZINE) 10 MG tablet Take  1 tablet (10 mg total) by mouth every 6 (six) hours as needed (Nausea or vomiting). 30 tablet 1  . simvastatin (ZOCOR) 40 MG tablet Take 40 mg by mouth every evening.      Current Facility-Administered Medications  Medication Dose Route Frequency Provider Last Rate Last Dose  . sodium chloride flush (NS) 0.9 % injection 10 mL  10 mL Intravenous PRN Jacob Merle, MD   10 mL at 12/07/18 1550    PHYSICAL EXAMINATION: ECOG PERFORMANCE STATUS: 0 - Asymptomatic  Vitals:   12/07/18 1527  BP: (!) 141/87  Pulse: 92  Resp: 18  Temp: 97.9 F (36.6 C)  SpO2: 99%   Filed Weights   12/07/18 1527  Weight: 220 lb 8 oz (100 kg)   GENERAL:alert, no distress and comfortable SKIN: skin color, texture, turgor are normal, no rashes or significant lesions EYES: normal, Conjunctiva are pink and non-injected, sclera clear OROPHARYNX:no exudate, no erythema and lips, buccal mucosa, and tongue normal  NECK: supple, thyroid normal size, non-tender, without nodularity LYMPH:  no palpable lymphadenopathy in the cervical, axillary or inguinal LUNGS: clear to auscultation and percussion with normal breathing effort HEART: regular rate & rhythm and no murmurs and no lower extremity edema ABDOMEN:abdomen soft,  non-tender and normal bowel sounds Musculoskeletal:no cyanosis of digits and no clubbing  NEURO: alert & oriented x 3 with fluent speech, no focal motor/sensory deficits  LABORATORY DATA:  I have reviewed the data as listed CBC Latest Ref Rng & Units 12/07/2018 11/16/2018 11/02/2018  WBC 4.0 - 10.5 K/uL 4.7 3.6(L) 4.3  Hemoglobin 13.0 - 17.0 g/dL 14.0 14.1 13.7  Hematocrit 39.0 - 52.0 % 39.5 40.9 40.5  Platelets 150 - 400 K/uL 249 284 190     CMP Latest Ref Rng & Units 12/07/2018 11/16/2018 11/02/2018  Glucose 70 - 99 mg/dL 92 81 105(H)  BUN 6 - 20 mg/dL '11 14 12  ' Creatinine 0.61 - 1.24 mg/dL 1.05 1.05 1.12  Sodium 135 - 145 mmol/L 141 140 140  Potassium 3.5 - 5.1 mmol/L 3.8 3.9 4.0  Chloride 98 - 111 mmol/L 105 106 104  CO2 22 - 32 mmol/L '27 26 27  ' Calcium 8.9 - 10.3 mg/dL 8.8(L) 9.5 9.1  Total Protein 6.5 - 8.1 g/dL 7.3 7.3 7.3  Total Bilirubin 0.3 - 1.2 mg/dL 0.5 0.4 0.4  Alkaline Phos 38 - 126 U/L 80 82 83  AST 15 - 41 U/L 26 48(H) 29  ALT 0 - 44 U/L 40 83(H) 56(H)      RADIOGRAPHIC STUDIES: I have personally reviewed the radiological images as listed and agreed with the findings in the report. No results found.   ASSESSMENT & PLAN:  DAIN LASETER is a 38 y.o. male with   1. Rectosigmoid Cancer,Stage IIIB(pT3N1M0), MMR normal -He was diagnosed in 06/2018. He is s/p anterior resectionresectionwith clear margins. Due to the tumor location mainly in sigmoid colon, radiation was not recommended.  -Due to the high risk of recurrence, he started adjuvant chemotherapy with [place] ox.  However he tolerated Oxaliplatin poorly and was stopped after 2 cycles. He is tolerating Xeloda well, plan for a total of 8 cycles (including CAPOX). -I have increased his oral dose to 2032m am, and 25075mpm, already well, with mild side effects. -Reviewed, adequate for treatment, plan to start cycle 5 next Wednesday.  2. HTN -On Losartan and HTCZ -Improved to 129/92 today  (11/16/18) -Continue to f/u with Dr. McDomenic Polite  3. transaminitis  -probably secondary  to chemo  -resolved today, continue monitoring   PLAN: -he will start cycle 5 chemo with Xeloda to 2011m in AM and 25058min PM on 1/29 -Lab and f/u in 3 weeks     No problem-specific Assessment & Plan notes found for this encounter.   No orders of the defined types were placed in this encounter.  All questions were answered. The patient knows to call the clinic with any problems, questions or concerns. No barriers to learning was detected. I spent 15 minutes counseling the patient face to face. The total time spent in the appointment was 20 minutes and more than 50% was on counseling and review of test results     YaTruitt MerleMD 12/07/2018   I, AmJoslyn Devonam acting as scribe for YaTruitt MerleMD.   I have reviewed the above documentation for accuracy and completeness, and I agree with the above.

## 2018-12-07 ENCOUNTER — Encounter: Payer: Self-pay | Admitting: Hematology

## 2018-12-07 ENCOUNTER — Ambulatory Visit: Payer: Self-pay | Admitting: Hematology

## 2018-12-07 ENCOUNTER — Inpatient Hospital Stay (HOSPITAL_BASED_OUTPATIENT_CLINIC_OR_DEPARTMENT_OTHER): Payer: Commercial Managed Care - PPO | Admitting: Hematology

## 2018-12-07 ENCOUNTER — Inpatient Hospital Stay: Payer: Commercial Managed Care - PPO | Attending: Hematology

## 2018-12-07 ENCOUNTER — Telehealth: Payer: Self-pay | Admitting: Hematology

## 2018-12-07 ENCOUNTER — Other Ambulatory Visit: Payer: Self-pay

## 2018-12-07 VITALS — BP 141/87 | HR 92 | Temp 97.9°F | Resp 18 | Ht 76.0 in | Wt 220.5 lb

## 2018-12-07 DIAGNOSIS — C187 Malignant neoplasm of sigmoid colon: Secondary | ICD-10-CM | POA: Diagnosis not present

## 2018-12-07 DIAGNOSIS — I10 Essential (primary) hypertension: Secondary | ICD-10-CM

## 2018-12-07 DIAGNOSIS — Z79899 Other long term (current) drug therapy: Secondary | ICD-10-CM | POA: Insufficient documentation

## 2018-12-07 DIAGNOSIS — C19 Malignant neoplasm of rectosigmoid junction: Secondary | ICD-10-CM | POA: Insufficient documentation

## 2018-12-07 LAB — CMP (CANCER CENTER ONLY)
ALK PHOS: 80 U/L (ref 38–126)
ALT: 40 U/L (ref 0–44)
AST: 26 U/L (ref 15–41)
Albumin: 4.2 g/dL (ref 3.5–5.0)
Anion gap: 9 (ref 5–15)
BUN: 11 mg/dL (ref 6–20)
CO2: 27 mmol/L (ref 22–32)
CREATININE: 1.05 mg/dL (ref 0.61–1.24)
Calcium: 8.8 mg/dL — ABNORMAL LOW (ref 8.9–10.3)
Chloride: 105 mmol/L (ref 98–111)
GFR, Est AFR Am: >60 mL/min (ref >60)
GFR, Estimated: >60 mL/min (ref >60)
Glucose, Bld: 92 mg/dL (ref 70–99)
Potassium: 3.8 mmol/L (ref 3.5–5.1)
Sodium: 141 mmol/L (ref 135–145)
Total Bilirubin: 0.5 mg/dL (ref 0.3–1.2)
Total Protein: 7.3 g/dL (ref 6.5–8.1)

## 2018-12-07 LAB — CBC WITH DIFFERENTIAL (CANCER CENTER ONLY)
Abs Immature Granulocytes: 0 10*3/uL (ref 0.00–0.07)
Basophils Absolute: 0.1 10*3/uL (ref 0.0–0.1)
Basophils Relative: 1 %
Eosinophils Absolute: 0.1 10*3/uL (ref 0.0–0.5)
Eosinophils Relative: 2 %
HCT: 39.5 % (ref 39.0–52.0)
Hemoglobin: 14 g/dL (ref 13.0–17.0)
IMMATURE GRANULOCYTES: 0 %
Lymphocytes Relative: 28 %
Lymphs Abs: 1.3 10*3/uL (ref 0.7–4.0)
MCH: 34.1 pg — ABNORMAL HIGH (ref 26.0–34.0)
MCHC: 35.4 g/dL (ref 30.0–36.0)
MCV: 96.1 fL (ref 80.0–100.0)
MONOS PCT: 10 %
Monocytes Absolute: 0.5 10*3/uL (ref 0.1–1.0)
NEUTROS PCT: 59 %
Neutro Abs: 2.8 10*3/uL (ref 1.7–7.7)
Platelet Count: 249 10*3/uL (ref 150–400)
RBC: 4.11 MIL/uL — ABNORMAL LOW (ref 4.22–5.81)
RDW: 15.1 % (ref 11.5–15.5)
WBC Count: 4.7 10*3/uL (ref 4.0–10.5)
nRBC: 0 % (ref 0.0–0.2)

## 2018-12-07 MED ORDER — SODIUM CHLORIDE 0.9% FLUSH
10.0000 mL | INTRAVENOUS | Status: DC | PRN
  Administered 2018-12-07: 10 mL via INTRAVENOUS
  Filled 2018-12-07: qty 10

## 2018-12-07 MED ORDER — HEPARIN SOD (PORK) LOCK FLUSH 100 UNIT/ML IV SOLN
500.0000 [IU] | Freq: Once | INTRAVENOUS | Status: AC
  Administered 2018-12-07: 500 [IU] via INTRAVENOUS
  Filled 2018-12-07: qty 5

## 2018-12-07 NOTE — Telephone Encounter (Signed)
Scheduled apt per 1/17 los - pt is aware of apt - no calender needed per patient request.

## 2018-12-07 NOTE — Progress Notes (Signed)
  Oncology Nurse Navigator Documentation   Met with patient to share resources and programming available at [facility]. Patient has my contact information for questions or concerns.

## 2018-12-26 ENCOUNTER — Other Ambulatory Visit: Payer: Self-pay | Admitting: Hematology

## 2018-12-26 DIAGNOSIS — C187 Malignant neoplasm of sigmoid colon: Secondary | ICD-10-CM

## 2018-12-28 ENCOUNTER — Inpatient Hospital Stay: Payer: Commercial Managed Care - PPO | Attending: Hematology | Admitting: Hematology

## 2018-12-28 ENCOUNTER — Inpatient Hospital Stay: Payer: Commercial Managed Care - PPO

## 2018-12-28 VITALS — BP 135/81 | HR 86 | Temp 98.1°F | Resp 18 | Ht 76.0 in | Wt 219.4 lb

## 2018-12-28 DIAGNOSIS — I10 Essential (primary) hypertension: Secondary | ICD-10-CM | POA: Insufficient documentation

## 2018-12-28 DIAGNOSIS — L271 Localized skin eruption due to drugs and medicaments taken internally: Secondary | ICD-10-CM | POA: Diagnosis not present

## 2018-12-28 DIAGNOSIS — C187 Malignant neoplasm of sigmoid colon: Secondary | ICD-10-CM

## 2018-12-28 DIAGNOSIS — C19 Malignant neoplasm of rectosigmoid junction: Secondary | ICD-10-CM | POA: Diagnosis not present

## 2018-12-28 DIAGNOSIS — Z95828 Presence of other vascular implants and grafts: Secondary | ICD-10-CM

## 2018-12-28 LAB — CMP (CANCER CENTER ONLY)
ALT: 31 U/L (ref 0–44)
AST: 25 U/L (ref 15–41)
Albumin: 4.2 g/dL (ref 3.5–5.0)
Alkaline Phosphatase: 80 U/L (ref 38–126)
Anion gap: 9 (ref 5–15)
BUN: 12 mg/dL (ref 6–20)
CO2: 25 mmol/L (ref 22–32)
Calcium: 8.8 mg/dL — ABNORMAL LOW (ref 8.9–10.3)
Chloride: 106 mmol/L (ref 98–111)
Creatinine: 1.01 mg/dL (ref 0.61–1.24)
Glucose, Bld: 99 mg/dL (ref 70–99)
Potassium: 3.7 mmol/L (ref 3.5–5.1)
Sodium: 140 mmol/L (ref 135–145)
Total Bilirubin: 0.7 mg/dL (ref 0.3–1.2)
Total Protein: 7.3 g/dL (ref 6.5–8.1)

## 2018-12-28 LAB — CBC WITH DIFFERENTIAL (CANCER CENTER ONLY)
Abs Immature Granulocytes: 0.01 10*3/uL (ref 0.00–0.07)
Basophils Absolute: 0 10*3/uL (ref 0.0–0.1)
Basophils Relative: 1 %
Eosinophils Absolute: 0.1 10*3/uL (ref 0.0–0.5)
Eosinophils Relative: 3 %
HCT: 40.1 % (ref 39.0–52.0)
Hemoglobin: 14.4 g/dL (ref 13.0–17.0)
Immature Granulocytes: 0 %
Lymphocytes Relative: 35 %
Lymphs Abs: 1.4 10*3/uL (ref 0.7–4.0)
MCH: 34.4 pg — ABNORMAL HIGH (ref 26.0–34.0)
MCHC: 35.9 g/dL (ref 30.0–36.0)
MCV: 95.9 fL (ref 80.0–100.0)
MONOS PCT: 9 %
Monocytes Absolute: 0.4 10*3/uL (ref 0.1–1.0)
Neutro Abs: 2 10*3/uL (ref 1.7–7.7)
Neutrophils Relative %: 52 %
Platelet Count: 212 10*3/uL (ref 150–400)
RBC: 4.18 MIL/uL — ABNORMAL LOW (ref 4.22–5.81)
RDW: 14.8 % (ref 11.5–15.5)
WBC Count: 3.9 10*3/uL — ABNORMAL LOW (ref 4.0–10.5)
nRBC: 0 % (ref 0.0–0.2)

## 2018-12-28 MED ORDER — UREA 10 % EX CREA: TOPICAL_CREAM | Freq: Two times a day (BID) | CUTANEOUS | 1 refills | Status: DC

## 2018-12-28 MED ORDER — HEPARIN SOD (PORK) LOCK FLUSH 100 UNIT/ML IV SOLN
500.0000 [IU] | Freq: Once | INTRAVENOUS | Status: AC
  Administered 2018-12-28: 500 [IU]
  Filled 2018-12-28: qty 5

## 2018-12-28 MED ORDER — SODIUM CHLORIDE 0.9% FLUSH
10.0000 mL | Freq: Once | INTRAVENOUS | Status: AC
  Administered 2018-12-28: 10 mL
  Filled 2018-12-28: qty 10

## 2018-12-28 NOTE — Progress Notes (Signed)
[place]   [phone] [phone]   Clinic Follow up Note   Patient Care Team: Chesley Noon, MD as PCP - General (Family Medicine) [name], MD (Cardiology) [name], MD as Attending Physician (Gastroenterology)  Date of Service:  12/28/2018  CHIEF COMPLAINT: f/u colon cancer, on adjuvant chemo   SUMMARY OF ONCOLOGIC HISTORY: Oncology History   Cancer Staging Cancer of sigmoid colon [facility]) Staging form: Colon and Rectum, AJCC 8th Edition - Pathologic stage from 08/07/2018: Stage IIIB (pT3, pN1a, cM0) - Signed by Truitt Merle, MD on 10/25/2018       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  The Multi-Gene Panel offered by [facility] includes sequencing and/or deletion duplication testing of the following 84 genes: AIP, ALK, APC, ATM, AXIN2,BAP1,  BARD1, BLM, BMPR1A, BRCA1, BRCA2, BRIP1, CASR, CDC73, CDH1, CDK4, CDKN1B, CDKN1C, CDKN2A (p14ARF), CDKN2A (p16INK4a), CEBPA, CHEK2, CTNNA1, DICER1, DIS3L2, EGFR (c.2369C>T, p.Thr790Met variant only), EPCAM (Deletion/duplication testing only), FH, FLCN, GATA2, GPC3, GREM1 (Promoter region deletion/duplication testing only), HOXB13 (c.251G>A, p.Gly84Glu), HRAS, KIT, MAX, MEN1, MET, MITF (c.952G>A, p.Glu318Lys variant only), MLH1, MSH2, MSH3, MSH6, MUTYH, NBN, NF1, NF2, NTHL1, PALB2, PDGFRA, PHOX2B, PMS2, POLD1, POLE, POT1, PRKAR1A, PTCH1, PTEN, RAD50, RAD51C, RAD51D, RB1, RECQL4, RET, RUNX1, SDHAF2, SDHA (sequence changes only), SDHB, SDHC, SDHD, SMAD4, SMARCA4, SMARCB1, SMARCE1, STK11, SUFU, TERC, TERT, TMEM127, TP53, TSC1, TSC2, VHL, WRN and WT1.  The report date is 11/22/2018.     Cancer of sigmoid colon ([place])   07/05/2018 Procedure    Colonoscopy by [name] 07/05/18  IMPRESSION - Tumor in the  rectum. Biopsied. - Diverticulosis in the entire examined colon. - Two 5 to 6 mm polyps, removed with a cold snare. Resected and retrieved. - The examination was otherwise normal on direct and retroflexion views.     07/05/2018 Initial Biopsy    Diagnosis 07/05/18 1. Colon, polyp(s), hepatic flexure - TUBULAR ADENOMA (ONE). - NO HIGH GRADE DYSPLASIA OR MALIGNANCY. 2. Rectum, biopsy, mass - INVASIVE MODERATELY DIFFERENTIATED COLORECTAL ADENOCARCINOMA.    07/10/2018 Imaging    CT CAP W [place]str[place] 07/10/18  IMPRESSION: 1. Mass within the rectosigmoid colon is identified compatible with findings from recent colonoscopy. 2. No evidence for nodal metastasis or distant metastatic disease. 3. Left kidney cyst.    07/17/2018 Imaging    MRI [facility]vis 07/17/18  IMPRESSION: 4.2 cm polypoid lesion along the right lateral aspect of the upper rectum, corresponding to the patient's biopsy-proven rectal adenocarcinoma, as above. Rectal adenocarcinoma T stage:  T1 or T2 Rectal adenocarcinoma N stage:  N0 Distance from tumor to the anal sphincter is 14 cm.    08/07/2018 Initial Diagnosis    Rectosigmoid cancer ([place])    08/07/2018 Surgery    XI ROBOTIC LOW ANTERIOR RESECTION by Dr. Kieth Brightly and Dr. Marcello Moores     08/07/2018 Pathologic Stage    Diagnosis 08/07/18 1. Colon, segmental resection for tumor, rectosigmoid - INVASIVE MODERATELY DIFFERENTIATED COLORECTAL ADENOCARCINOMA, 3.5 CM. - CARCINOMA FOCALLY INVOLVES PERICOLONIC CONNECTIVE TISSUE. - METASTATIC CARCINOMA IN ONE OF FIFTEEN LYMPH NODES (1/15). - MARGINS NOT INVOLVED. 2. Colon, resection margin (donut), distal ring, sigmoid - BENIGN COLON. - NO EVIDENCE OF MALIGNANCY.    08/07/2018 Cancer Staging    Staging form: Colon and Rectum, AJCC 8th Edition - Pathologic stage from 08/07/2018: Stage  IIIB (pT3, pN1a, cM0) - Signed by Truitt Merle, MD on 10/25/2018    08/29/2018 -  Chemotherapy    Adjuvant CAPOX every 3 weeks with Xeloda 2061m twice daily 2  weeks on 1 week off.starting 08/29/18. D/c Oxaliplatin after 09/28/18 due to very poor toleration. Increased to 2001min AM and 250029mn PM starting with cycle 4.     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  The Multi-Gene Panel offered by [facility] includes sequencing and/or deletion duplication testing of the following 84 genes: AIP, ALK, APC, ATM, AXIN2,BAP1,  BARD1, BLM, BMPR1A, BRCA1, BRCA2, BRIP1, CASR, CDC73, CDH1, CDK4, CDKN1B, CDKN1C, CDKN2A (p14ARF), CDKN2A (p16INK4a), CEBPA, CHEK2, CTNNA1, DICER1, DIS3L2, EGFR (c.2369C>T, p.Thr790Met variant only), EPCAM (Deletion/duplication testing only), FH, FLCN, GATA2, GPC3, GREM1 (Promoter region deletion/duplication testing only), HOXB13 (c.251G>A, p.Gly84Glu), HRAS, KIT, MAX, MEN1, MET, MITF (c.952G>A, p.Glu318Lys variant only), MLH1, MSH2, MSH3, MSH6, MUTYH, NBN, NF1, NF2, NTHL1, PALB2, PDGFRA, PHOX2B, PMS2, POLD1, POLE, POT1, PRKAR1A, PTCH1, PTEN, RAD50, RAD51C, RAD51D, RB1, RECQL4, RET, RUNX1, SDHAF2, SDHA (sequence changes only), SDHB, SDHC, SDHD, SMAD4, SMARCA4, SMARCB1, SMARCE1, STK11, SUFU, TERC, TERT, TMEM127, TP53, TSC1, TSC2, VHL, WRN and WT1.  The report date is 11/22/2018.      CURRENT THERAPY:  Adjuvant CAPOX, started on August 29, 2018, oxaliplatin stopped after 2 cycles, currently on Xeloda 2000m52m and 2500mg58m day 1-14 every 21 days. Increased cycle 5 chemo with Xeloda to 2000mg 49mM and 2500mg i44m on 1/29. Reduced back to 2000mg BI83marting with cycle 6 due to hand/foot syndrome.   INTERVAL HISTORY:  Jacob Hayes for a follow up of colon cancer and treatment. He presents to the clinic today with wife. He is doing well but is describing that is experiencing hand/foot syndrome. He lotions his feet and has noticed that his feet have been smelling. His left foot is swollen, tender and purple.  He had to take off work due to pain and discomfort. He had to work from home and  elevates his feet. At work he has to use steel toe boots and walk a lot. His next cycle starts on Tuesday 01/01/2019.  He gets has an irritated rectum.    REVIEW OF SYSTEMS:   Constitutional: Denies fevers, chills or abnormal weight loss Eyes: Denies blurriness of vision Ears, nose, mouth, throat, and face: Denies mucositis or sore throat Respiratory: Denies cough, dyspnea or wheezes Cardiovascular: Denies palpitation, chest discomfort or lower extremity swelling Gastrointestinal:  Denies nausea, heartburn or change in bowel habits, (+) irritated rectum Skin: Denies abnormal skin rashes, (+) hand/foot syndrome  Lymphatics: Denies new lymphadenopathy or easy bruising Neurological:Denies numbness, tingling or new weaknesses Behavioral/Psych: Mood is stable, no new changes  All other systems were reviewed with the patient and are negative.  MEDICAL HISTORY:  Past Medical History:  Diagnosis Date  . Allergic rhinitis   . Anxiety   . Anxiety and depression    DENIES   . Bruxism   . Colon cancer (HCC)   .[place] hypertension, benign   . Family history of breast cancer   . Family history of melanoma   . GERD   . Hyperlipidemia    Since age 58  . Me50ere's disease     SURGICAL HISTORY: Past Surgical History:  Procedure Laterality Date  . BIOPSY N/A 03/20/2013   Procedure: BIOPSY;  Surgeon: Robert MDaneil Dolinocation: [facility];  Service: Endoscopy;  Laterality: N/A;  Possible small bowel biopsy  . BIOPSY  07/05/2018   Procedure: BIOPSY;  Surgeon: [name], MD;  [facility];  Service: Endoscopy;;  rectal mass  . COLONOSCOPY WITH PROPOFOL N/A 07/05/2018   Procedure: COLONOSCOPY WITH PROPOFOL;  Surgeon: [name], MD;  [facility];  Service: Endoscopy;  Laterality: N/A;  8:30am  . ESOPHAGOGASTRODUODENOSCOPY N/A 03/20/2013   [name], in the upper esophagus, through the upper esophageal sphincter, multiple areas of salmon-colored epithelium  consistent with inlet patches.  One slightly nodular.  Small hiatal hernia.  Multiple fundic gland appearing polyps. no evidence of celiac disease, malignancy, h.pylori, barrett's.   . None    . POLYPECTOMY  07/05/2018   Procedure: POLYPECTOMY;  Surgeon: [name], MD;  [facility];  Service: Endoscopy;;  hepatic flexurex2;  . PORTACATH PLACEMENT N/A 08/23/2018   Procedure: INSERTION PORT-A-CATH;  Surgeon: [name], MD;  Location: [facility];  Service: General;  Laterality: N/A;  LMA  . XI ROBOTIC ASSISTED LOWER ANTERIOR RESECTION      I have reviewed the social history and family history with the patient and they are unchanged from previous note.  ALLERGIES:  has No Known Allergies.  MEDICATIONS:  Current Outpatient Medications  Medication Sig Dispense Refill  . acetaminophen (TYLENOL) 500 MG tablet Take 500 mg by mouth every 6 (six) hours as needed for moderate pain or headache.     . Ascorbic Acid (VITAMIN C) 1000 MG tablet Take 1,000 mg by mouth daily.    Marland Kitchen b complex vitamins capsule Take 1 capsule by mouth daily.    . capecitabine (XELODA) 500 MG tablet TAKE 4 TABLETS BY MOUTH IN THE MORNING AFTER MEAL AND 5 TABLETS IN THEEVENING AFTER A MEAL FOR 14 DAYS 126 tablet 0  . cetirizine (ZYRTEC) 10 MG tablet Take 10 mg by mouth daily as needed for allergies.     . diazepam (VALIUM) 5 MG tablet Take 2.5 mg by mouth at bedtime.   1  . diphenhydrAMINE (BENADRYL) 25 mg capsule Take 25 mg by mouth daily as needed for allergies.     . fluticasone (FLONASE) 50 MCG/ACT nasal spray Place 2 sprays into the nose daily. (Patient taking differently: Place 2 sprays into the nose daily as needed (for seasonal allergies.). ) 16 g 11  . hydrochlorothiazide (MICROZIDE) 12.5 MG capsule Take 12.5 mg by mouth daily.  5  . ibuprofen (ADVIL,MOTRIN) 400 MG tablet Take 1-2 tablets (400-800 mg total) by mouth every 6 (six) hours as needed for fever, headache, mild pain or moderate pain. 30 tablet 0    . lidocaine-prilocaine (EMLA) cream Apply small amount over port site and cover with plastic wrap one hour prior to appointment. 30 g 3  . LORazepam (ATIVAN) 1 MG tablet Take 1 tablet (1 mg total) by mouth every 8 (eight) hours as needed for anxiety. 30 tablet 0  . losartan (COZAAR) 100 MG tablet Take 100 mg by mouth daily.   5  . Magnesium 250 MG TABS Take 250 mg by mouth 2 (two) times daily.     . methimazole (TAPAZOLE) 5 MG tablet Take 5 mg by mouth 2 (two) times daily.     . Multiple Vitamin (MULTIVITAMIN WITH MINERALS) TABS tablet Take 1 tablet by mouth daily. Men's One-A-Day    . ondansetron (ZOFRAN) 8 MG tablet Take 1 tablet (8 mg total) by mouth every 8 (eight) hours as needed for nausea or vomiting.  30 tablet 1  . pantoprazole (PROTONIX) 40 MG tablet TAKE ONE TABLET BY MOUTH TWICE DAILY BEFORE A MEAL (Patient taking differently: Take 40 mg by mouth daily. ) 60 tablet 0  . prochlorperazine (COMPAZINE) 10 MG tablet Take 1 tablet (10 mg total) by mouth every 6 (six) hours as needed (Nausea or vomiting). 30 tablet 1  . simvastatin (ZOCOR) 40 MG tablet Take 40 mg by mouth every evening.     . urea (CARMOL) 10 % cream Apply topically 2 (two) times daily. 71 g 1   No current facility-administered medications for this visit.     PHYSICAL EXAMINATION: ECOG PERFORMANCE STATUS: 1 - Symptomatic but completely ambulatory  Vitals:   12/28/18 1547  BP: 135/81  Pulse: 86  Resp: 18  Temp: 98.1 F (36.7 C)  SpO2: 98%   Filed Weights   12/28/18 1547  Weight: 219 lb 6.4 oz (99.5 kg)    GENERAL:alert, no distress and comfortable SKIN: skin texture, turgor are normal, no rashes or significant lesions, (+) skin erythema and dryness of skin and feet, no skin breakdown or ulcers, or pealing  EYES: normal, Conjunctiva are pink and non-injected, sclera clear OROPHARYNX:no exudate, no erythema and lips, buccal mucosa, and tongue normal  NECK: supple, thyroid normal size, non-tender, without  nodularity LYMPH:  no palpable lymphadenopathy in the cervical, axillary or inguinal LUNGS: clear to auscultation and percussion with normal breathing effort HEART: regular rate & rhythm and no murmurs and no lower extremity edema ABDOMEN:abdomen soft, non-tender and normal bowel sounds Musculoskeletal:no cyanosis of digits and no clubbing  NEURO: alert & oriented x 3 with fluent speech, no focal motor/sensory deficits  LABORATORY DATA:  I have reviewed the data as listed CBC Latest Ref Rng & Units 12/28/2018 12/07/2018 11/16/2018  WBC 4.0 - 10.5 K/uL 3.9(L) 4.7 3.6(L)  Hemoglobin 13.0 - 17.0 g/dL 14.4 14.0 14.1  Hematocrit 39.0 - 52.0 % 40.1 39.5 40.9  Platelets 150 - 400 K/uL 212 249 284     CMP Latest Ref Rng & Units 12/28/2018 12/07/2018 11/16/2018  Glucose 70 - 99 mg/dL 99 92 81  BUN 6 - 20 mg/dL '12 11 14  ' Creatinine 0.61 - 1.24 mg/dL 1.01 1.05 1.05  Sodium 135 - 145 mmol/L 140 141 140  Potassium 3.5 - 5.1 mmol/L 3.7 3.8 3.9  Chloride 98 - 111 mmol/L 106 105 106  CO2 22 - 32 mmol/L '25 27 26  ' Calcium 8.9 - 10.3 mg/dL 8.8(L) 8.8(L) 9.5  Total Protein 6.5 - 8.1 g/dL 7.3 7.3 7.3  Total Bilirubin 0.3 - 1.2 mg/dL 0.7 0.5 0.4  Alkaline Phos 38 - 126 U/L 80 80 82  AST 15 - 41 U/L 25 26 48(H)  ALT 0 - 44 U/L 31 40 83(H)      RADIOGRAPHIC STUDIES: I have personally reviewed the radiological images as listed and agreed with the findings in the report. No results found.   ASSESSMENT & PLAN:  MALAKHAI BEITLER is a 38 y.o. male with   1. Rectosigmoid Cancer,Stage IIIB(pT3N1M0), MMR normal -He was diagnosed in 06/2018.He is s/p anterior resectionwith clear margins.Due to the tumor location mainly in sigmoid colon, radiation was not recommended. -Due to the high risk of recurrence, he started adjuvant chemotherapy with Capeox.  However he toleratedOxaliplatin poorly and was stopped after 2 cycles. He is tolerating Xeloda well, plan for a total of 8 cycles (including CAPOX). I increased  his dose to 2033m am, and 25059mpm, but he developed worse  hand-foot syndrome.  He otherwise is tolerating Xeloda well. -Reviewed, CBC and CMP are unremarkable, adequate to continue treatment -Reduce Xarelto to [facility] milligrams twice daily from next cycle (cycle 6), he will start next week when he recovers better from hand-foot syndrome   2. HTN -On Losartan and HTCZ -BP controlled  -Continue to f/u with Dr. Domenic Polite.   3. transaminitis -probablysecondary to chemo  -resolved, continue monitoring   4. Hand/foot syndrome -He has developed significant skin erythema and mild skin crackling on his palms and bottom of feet, no blisters or skin peeling -I recommend him to continue use hand moisturizer currently, and try over-the-counter hydrocortisone cream, and I called in urea cream for him to use twice daily  PLAN: - I will prescribe urea cream - He is going to reduce xeloda dose to 2038m q12h, 2 weeks on and one week off, starting cycle 6 next Wednesday or later after he recovers well from skin toxicity   - Lab, flush and f/u in 3 weeks   No problem-specific Assessment & Plan notes found for this encounter.   No orders of the defined types were placed in this encounter.  All questions were answered. The patient knows to call the clinic with any problems, questions or concerns. No barriers to learning was detected. I spent 15 minutes counseling the patient face to face. The total time spent in the appointment was 20 minutes and more than 50% was on counseling and review of test results     YTruitt Merle MD 12/28/2018   I, DManson Allan am acting as scribe for YTruitt Merle MD.   I have reviewed the above documentation for accuracy and completeness, and I agree with the above.

## 2018-12-29 ENCOUNTER — Encounter: Payer: Self-pay | Admitting: Hematology

## 2018-12-31 ENCOUNTER — Telehealth: Payer: Self-pay | Admitting: Hematology

## 2018-12-31 NOTE — Telephone Encounter (Signed)
Scheduled patient appt per 02/7 los.  Called patient and patient is aware of appt date and time.

## 2019-01-18 NOTE — Progress Notes (Signed)
[place]   [phone] [phone]   Clinic Follow up Note   Patient Care Team: Chesley Noon, MD as PCP - General (Family Medicine) [name], MD (Cardiology) [name], MD as Attending Physician (Gastroenterology)  Date of Service:  01/21/2019  CHIEF COMPLAINT: f/u colon cancer, on adjuvant chemo  SUMMARY OF ONCOLOGIC HISTORY: Oncology History   Cancer Staging Cancer of sigmoid colon [facility]) Staging form: Colon and Rectum, AJCC 8th Edition - Pathologic stage from 08/07/2018: Stage IIIB (pT3, pN1a, cM0) - Signed by Truitt Merle, MD on 10/25/2018       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  The Multi-Gene Panel offered by [facility] includes sequencing and/or deletion duplication testing of the following 84 genes: AIP, ALK, APC, ATM, AXIN2,BAP1,  BARD1, BLM, BMPR1A, BRCA1, BRCA2, BRIP1, CASR, CDC73, CDH1, CDK4, CDKN1B, CDKN1C, CDKN2A (p14ARF), CDKN2A (p16INK4a), CEBPA, CHEK2, CTNNA1, DICER1, DIS3L2, EGFR (c.2369C>T, p.Thr790Met variant only), EPCAM (Deletion/duplication testing only), FH, FLCN, GATA2, GPC3, GREM1 (Promoter region deletion/duplication testing only), HOXB13 (c.251G>A, p.Gly84Glu), HRAS, KIT, MAX, MEN1, MET, MITF (c.952G>A, p.Glu318Lys variant only), MLH1, MSH2, MSH3, MSH6, MUTYH, NBN, NF1, NF2, NTHL1, PALB2, PDGFRA, PHOX2B, PMS2, POLD1, POLE, POT1, PRKAR1A, PTCH1, PTEN, RAD50, RAD51C, RAD51D, RB1, RECQL4, RET, RUNX1, SDHAF2, SDHA (sequence changes only), SDHB, SDHC, SDHD, SMAD4, SMARCA4, SMARCB1, SMARCE1, STK11, SUFU, TERC, TERT, TMEM127, TP53, TSC1, TSC2, VHL, WRN and WT1.  The report date is 11/22/2018.     Cancer of sigmoid colon ([place])   07/05/2018 Procedure    Colonoscopy by [name] 07/05/18  IMPRESSION - Tumor in the  rectum. Biopsied. - Diverticulosis in the entire examined colon. - Two 5 to 6 mm polyps, removed with a cold snare. Resected and retrieved. - The examination was otherwise normal on direct and retroflexion views.     07/05/2018 Initial Biopsy    Diagnosis 07/05/18 1. Colon, polyp(s), hepatic flexure - TUBULAR ADENOMA (ONE). - NO HIGH GRADE DYSPLASIA OR MALIGNANCY. 2. Rectum, biopsy, mass - INVASIVE MODERATELY DIFFERENTIATED COLORECTAL ADENOCARCINOMA.    07/10/2018 Imaging    CT CAP W [facility]strast 07/10/18  IMPRESSION: 1. Mass within the rectosigmoid colon is identified compatible with findings from recent colonoscopy. 2. No evidence for nodal metastasis or distant metastatic disease. 3. Left kidney cyst.    07/17/2018 Imaging    MRI P[place]vis 07/17/18  IMPRESSION: 4.2 cm polypoid lesion along the right lateral aspect of the upper rectum, corresponding to the patient's biopsy-proven rectal adenocarcinoma, as above. Rectal adenocarcinoma T stage:  T1 or T2 Rectal adenocarcinoma N stage:  N0 Distance from tumor to the anal sphincter is 14 cm.    08/07/2018 Initial Diagnosis    Rectosigmoid cancer ([place])    08/07/2018 Surgery    XI ROBOTIC LOW ANTERIOR RESECTION by Dr. Kieth Brightly and Dr. Marcello Moores     08/07/2018 Pathologic Stage    Diagnosis 08/07/18 1. Colon, segmental resection for tumor, rectosigmoid - INVASIVE MODERATELY DIFFERENTIATED COLORECTAL ADENOCARCINOMA, 3.5 CM. - CARCINOMA FOCALLY INVOLVES PERICOLONIC CONNECTIVE TISSUE. - METASTATIC CARCINOMA IN ONE OF FIFTEEN LYMPH NODES (1/15). - MARGINS NOT INVOLVED. 2. Colon, resection margin (donut), distal ring, sigmoid - BENIGN COLON. - NO EVIDENCE OF MALIGNANCY.    08/07/2018 Cancer Staging    Staging form: Colon and Rectum, AJCC 8th Edition - Pathologic stage from 08/07/2018: Stage IIIB (  pT3, pN1a, cM0) - Signed by Truitt Merle, MD on 10/25/2018    08/29/2018 -  Chemotherapy    Adjuvant CAPOX every 3 weeks with Xeloda 203m twice daily 2  weeks on 1 week off.starting 08/29/18. D/c Oxaliplatin after 09/28/18 due to very poor toleration. Increased to 20074min AM and 250068mn PM starting with cycle 4. Due to hand-foot syndrome we reduced back to 2000m65mD starting with cycle 6.     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  The Multi-Gene Panel offered by [facility] includes sequencing and/or deletion duplication testing of the following 84 genes: AIP, ALK, APC, ATM, AXIN2,BAP1,  BARD1, BLM, BMPR1A, BRCA1, BRCA2, BRIP1, CASR, CDC73, CDH1, CDK4, CDKN1B, CDKN1C, CDKN2A (p14ARF), CDKN2A (p16INK4a), CEBPA, CHEK2, CTNNA1, DICER1, DIS3L2, EGFR (c.2369C>T, p.Thr790Met variant only), EPCAM (Deletion/duplication testing only), FH, FLCN, GATA2, GPC3, GREM1 (Promoter region deletion/duplication testing only), HOXB13 (c.251G>A, p.Gly84Glu), HRAS, KIT, MAX, MEN1, MET, MITF (c.952G>A, p.Glu318Lys variant only), MLH1, MSH2, MSH3, MSH6, MUTYH, NBN, NF1, NF2, NTHL1, PALB2, PDGFRA, PHOX2B, PMS2, POLD1, POLE, POT1, PRKAR1A, PTCH1, PTEN, RAD50, RAD51C, RAD51D, RB1, RECQL4, RET, RUNX1, SDHAF2, SDHA (sequence changes only), SDHB, SDHC, SDHD, SMAD4, SMARCA4, SMARCB1, SMARCE1, STK11, SUFU, TERC, TERT, TMEM127, TP53, TSC1, TSC2, VHL, WRN and WT1.  The report date is 11/22/2018.      CURRENT THERAPY:  Adjuvant CAPOX,started on August 29, 2018, oxaliplatin stopped after 2 cycles, currently on Xeloda2000mg27mand 2500mg 81mday 1-14 every 21 days. Increased cycle 5 chemo withXeloda to 2000mg i64m and 2500mg in30mon 1/29. Reduced back to 2000mg BID23mrting with cycle 7 due to skin toxicities.   INTERVAL HISTORY:  Jacob P SKENITH TRICKELfor a follow up of colon cancer and treatment. He presents to the clinic today by himself. He notes he is doing moderately well. He still has moderate hand-foot syndrome with skin peeling and redness. He notes he did not reduce dose of Xeloda, he continued 2000mg in A36md 2500mg in  P63me notes he plans to dose reduce to 2000mg BID wi73mast 2 cycles.   He notes having urethral irritation that started the week after he started 2500mg. When h45ms off Xeloda. He notes he has a ulcer are urethral meatus. He denies dysuria or UTI symptoms. He denies fever.  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  He notes he is trying to be more active and his goal weight is 210 lbs. He is cleared to workup. He notes when he does core exercise it is sore.     REVIEW OF SYSTEMS:   Constitutional: Denies fevers, chills or abnormal weight loss (+) Fatigue  Eyes: Denies blurriness of vision Ears, nose, mouth, throat, and face: Denies mucositis or sore throat Respiratory: Denies cough, dyspnea or wheezes Cardiovascular: Denies palpitation, chest discomfort or lower extremity swelling Gastrointestinal:  Denies nausea, heartburn or change in bowel habits Skin: Denies abnormal skin rashes (+) Moderate hand-foot syndrome (+) Toe nail discoloration UA: (+) Urethral irritation and ulcer  Lymphatics: Denies new lymphadenopathy or easy bruising Neurological:Denies numbness, tingling or new weaknesses Behavioral/Psych: Mood is stable, no new changes  All other systems were reviewed with the patient and are negative.  MEDICAL HISTORY:  Past Medical History:  Diagnosis Date  . Allergic rhinitis   . Anxiety   . Anxiety and depression  DENIES   . Bruxism   . Colon cancer ([place])   . Essential hypertension, benign   . Family history of breast cancer   . Family history of melanoma   . GERD   . Hyperlipidemia    Since age 21  . Meniere's disease     SURGICAL HISTORY: Past Surgical History:  Procedure Laterality Date  . BIOPSY N/A 03/20/2013   Procedure: BIOPSY;  Surgeon: [name], MD;  [facility];  Service: Endoscopy;  Laterality: N/A;  Possible small bowel biopsy  . BIOPSY   07/05/2018   Procedure: BIOPSY;  Surgeon: [name], MD;  Location: [facility];  Service: Endoscopy;;  rectal mass  . COLONOSCOPY WITH PROPOFOL N/A 07/05/2018   Procedure: COLONOSCOPY WITH PROPOFOL;  Surgeon: [name], MD;  Location: [facility];  Service: Endoscopy;  Laterality: N/A;  8:30am  . ESOPHAGOGASTRODUODENOSCOPY N/A 03/20/2013   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  One slightly nodular.  Small hiatal hernia.  Multiple fundic gland appearing polyps. no evidence of celiac disease, malignancy, h.pylori, barrett's.   . None    . POLYPECTOMY  07/05/2018   Procedure: POLYPECTOMY;  Surgeon: [name], MD;  Location: [facility];  Service: Endoscopy;;  hepatic flexurex2;  . PORTACATH PLACEMENT N/A 08/23/2018   Procedure: INSERTION PORT-A-CATH;  Surgeon: [name], MD;  Location: [facility];  Service: General;  Laterality: N/A;  LMA  . XI ROBOTIC ASSISTED LOWER ANTERIOR RESECTION      I have reviewed the social history and family history with the patient and they are unchanged from previous note.  ALLERGIES:  has No Known Allergies.  MEDICATIONS:  Current Outpatient Medications  Medication Sig Dispense Refill  . acetaminophen (TYLENOL) 500 MG tablet Take 500 mg by mouth every 6 (six) hours as needed for moderate pain or headache.     . Ascorbic Acid (VITAMIN C) 1000 MG tablet Take 1,000 mg by mouth daily.    Marland Kitchen b complex vitamins capsule Take 1 capsule by mouth daily.    . capecitabine (XELODA) 500 MG tablet TAKE 4 TABLETS BY MOUTH IN THE MORNING AFTER MEAL AND 5 TABLETS IN THEEVENING AFTER A MEAL FOR 14 DAYS 126 tablet 0  . cetirizine (ZYRTEC) 10 MG tablet Take 10 mg by mouth daily as needed for allergies.     . diazepam (VALIUM) 5 MG tablet Take 2.5 mg by mouth at bedtime.   1  . diphenhydrAMINE (BENADRYL) 25 mg capsule Take 25 mg by mouth daily as  needed for allergies.     . fluticasone (FLONASE) 50 MCG/ACT nasal spray Place 2 sprays into the nose daily. (Patient taking differently: Place 2 sprays into the nose daily as needed (for seasonal allergies.). ) 16 g 11  . hydrochlorothiazide (MICROZIDE) 12.5 MG capsule Take 12.5 mg by mouth daily.  5  . ibuprofen (ADVIL,MOTRIN) 400 MG tablet Take 1-2 tablets (400-800 mg total) by mouth every 6 (six) hours as needed for fever, headache, mild pain or moderate pain. 30 tablet 0  . lidocaine-prilocaine (EMLA) cream Apply small amount over port site and cover with plastic wrap one hour prior to appointment. 30 g 3  . LORazepam (ATIVAN) 1 MG tablet Take 1 tablet (1 mg total) by mouth every 8 (eight) hours as needed for anxiety. 30 tablet 0  . losartan (COZAAR) 100 MG tablet Take 100 mg by  mouth daily.   5  . Magnesium 250 MG TABS Take 250 mg by mouth 2 (two) times daily.     . methimazole (TAPAZOLE) 5 MG tablet Take 5 mg by mouth 2 (two) times daily.     . Multiple Vitamin (MULTIVITAMIN WITH MINERALS) TABS tablet Take 1 tablet by mouth daily. Men's One-A-Day    . ondansetron (ZOFRAN) 8 MG tablet Take 1 tablet (8 mg total) by mouth every 8 (eight) hours as needed for nausea or vomiting. 30 tablet 1  . pantoprazole (PROTONIX) 40 MG tablet TAKE ONE TABLET BY MOUTH TWICE DAILY BEFORE A MEAL (Patient taking differently: Take 40 mg by mouth daily. ) 60 tablet 0  . prochlorperazine (COMPAZINE) 10 MG tablet Take 1 tablet (10 mg total) by mouth every 6 (six) hours as needed (Nausea or vomiting). 30 tablet 1  . simvastatin (ZOCOR) 40 MG tablet Take 40 mg by mouth every evening.     . urea (CARMOL) 10 % cream Apply topically 2 (two) times daily. 71 g 1   No current facility-administered medications for this visit.     PHYSICAL EXAMINATION: ECOG PERFORMANCE STATUS: 1 - Symptomatic but completely ambulatory  Vitals:   01/21/19 0852  BP: 140/84  Pulse: 69  Resp: 18  Temp: 97.6 F (36.4 C)  SpO2: 99%    Filed Weights   01/21/19 0852  Weight: 222 lb 11.2 oz (101 kg)    GENERAL:alert, no distress and comfortable SKIN: skin color, texture, turgor are normal, no rashes or significant lesions EYES: normal, Conjunctiva are pink and non-injected, sclera clear OROPHARYNX:no exudate, no erythema and lips, buccal mucosa, and tongue normal  NECK: supple, thyroid normal size, non-tender, without nodularity LYMPH:  no palpable lymphadenopathy in the cervical, axillary or inguinal LUNGS: clear to auscultation and percussion with normal breathing effort HEART: regular rate & rhythm and no murmurs and no lower extremity edema ABDOMEN:abdomen soft, non-tender and normal bowel sounds Musculoskeletal:no cyanosis of digits and no clubbing  NEURO: alert & oriented x 3 with fluent speech, no focal motor/sensory deficits  LABORATORY DATA:  I have reviewed the data as listed CBC Latest Ref Rng & Units 01/21/2019 12/28/2018 12/07/2018  WBC 4.0 - 10.5 K/uL 3.6(L) 3.9(L) 4.7  Hemoglobin 13.0 - 17.0 g/dL 14.9 14.4 14.0  Hematocrit 39.0 - 52.0 % 41.0 40.1 39.5  Platelets 150 - 400 K/uL 235 212 249     CMP Latest Ref Rng & Units 01/21/2019 12/28/2018 12/07/2018  Glucose 70 - 99 mg/dL 97 99 92  BUN 6 - 20 mg/dL '13 12 11  ' Creatinine 0.61 - 1.24 mg/dL 1.10 1.01 1.05  Sodium 135 - 145 mmol/L 138 140 141  Potassium 3.5 - 5.1 mmol/L 3.8 3.7 3.8  Chloride 98 - 111 mmol/L 104 106 105  CO2 22 - 32 mmol/L '24 25 27  ' Calcium 8.9 - 10.3 mg/dL 9.0 8.8(L) 8.8(L)  Total Protein 6.5 - 8.1 g/dL 7.3 7.3 7.3  Total Bilirubin 0.3 - 1.2 mg/dL 0.9 0.7 0.5  Alkaline Phos 38 - 126 U/L 84 80 80  AST 15 - 41 U/L 35 25 26  ALT 0 - 44 U/L 43 31 40      RADIOGRAPHIC STUDIES: I have personally reviewed the radiological images as listed and agreed with the findings in the report. No results found.   ASSESSMENT & PLAN:  Jacob Hayes is a 38 y.o. male with   1. Rectosigmoid Cancer,Stage IIIB(pT3N1M0), MMR normal -He was diagnosed  in 06/2018.He  is s/p anterior resectionwith clear margins.Due to the tumor location mainly in sigmoid colon, radiation was not recommended. -Due to the high risk of recurrence, he started adjuvant chemotherapy with Capeox. However he toleratedOxaliplatinpoorly and was stopped after 2 cycles. He is toleratingXelodawell,plan for atotal of 8cycles (including CAPOX). He tried dose increase, but due to worsening hand-foot syndrome, we reduced him back to original dose, 2019m BID. He otherwise is tolerating Xeloda well. -He notes he did not reduce dose with cycle 6, he continued Xeloda 20069mam and 250037mm. His hand-foot syndrome is still moderate and with increase dose he has urethral irration and ulcer at urethral meatus and fatigue. He will dose reduce to 2000m36mD with cycle 7 and 8.  -I encouraged him to continue brain exercises for his foggy brain.   -Labs reviewed, CBC and CMP WNL except WBC at 3.6. Overall adequate to proceed with cycle 7 Xeloda tomorrow. If he needs longer for skin toxicity to improve, he can start cycle 7 a few days later.  -Will order CT scan at next visit.  -F/u in 3 weeks    2. HTN -On Losartan and HTCZ -BP controlled  -Continue to f/u with Dr. McDoDomenic Polite3. Transaminitis -probablysecondary to chemo -resolved, continue monitoring  4. Hand/foot syndrome -He has developed significant skin erythema and mild skin crackling on his palms and bottom of feet, no blisters or skin peeling. This is still moderate as he did not dose reduce with cycle 6. He will with cycle 7 and 8.  -He notes Urea cream is not as effective for him. I encouraged him to continue hand moisturizer and hydrocortisone cream.   PLAN: -Labs reviewed and adequate to proceed with cycle 7 Xeloda 2000mg69m tomorrow or later this week  -Lab, flush and f/u in 3 weeks    No problem-specific Assessment & Plan notes found for this encounter.   No orders of the defined types were  placed in this encounter.  All questions were answered. The patient knows to call the clinic with any problems, questions or concerns. No barriers to learning was detected. I spent 20 minutes counseling the patient face to face. The total time spent in the appointment was 25 minutes and more than 50% was on counseling and review of test results     Apolinar Bero FTruitt Merle3/12/2018   I, AmoyaJoslyn Devonacting as scribe for Baraa Tubbs FTruitt Merle   I have reviewed the above documentation for accuracy and completeness, and I agree with the above.

## 2019-01-21 ENCOUNTER — Inpatient Hospital Stay (HOSPITAL_BASED_OUTPATIENT_CLINIC_OR_DEPARTMENT_OTHER): Payer: Commercial Managed Care - PPO | Admitting: Hematology

## 2019-01-21 ENCOUNTER — Inpatient Hospital Stay: Payer: Commercial Managed Care - PPO

## 2019-01-21 ENCOUNTER — Inpatient Hospital Stay: Payer: Commercial Managed Care - PPO | Attending: Hematology

## 2019-01-21 ENCOUNTER — Telehealth: Payer: Self-pay | Admitting: Hematology

## 2019-01-21 ENCOUNTER — Other Ambulatory Visit: Payer: Self-pay

## 2019-01-21 VITALS — BP 140/84 | HR 69 | Temp 97.6°F | Resp 18 | Ht 76.0 in | Wt 222.7 lb

## 2019-01-21 DIAGNOSIS — C19 Malignant neoplasm of rectosigmoid junction: Secondary | ICD-10-CM | POA: Insufficient documentation

## 2019-01-21 DIAGNOSIS — I10 Essential (primary) hypertension: Secondary | ICD-10-CM

## 2019-01-21 DIAGNOSIS — C187 Malignant neoplasm of sigmoid colon: Secondary | ICD-10-CM

## 2019-01-21 DIAGNOSIS — N342 Other urethritis: Secondary | ICD-10-CM

## 2019-01-21 DIAGNOSIS — L271 Localized skin eruption due to drugs and medicaments taken internally: Secondary | ICD-10-CM | POA: Insufficient documentation

## 2019-01-21 DIAGNOSIS — Z95828 Presence of other vascular implants and grafts: Secondary | ICD-10-CM

## 2019-01-21 LAB — CMP (CANCER CENTER ONLY)
ALT: 43 U/L (ref 0–44)
ANION GAP: 10 (ref 5–15)
AST: 35 U/L (ref 15–41)
Albumin: 4.2 g/dL (ref 3.5–5.0)
Alkaline Phosphatase: 84 U/L (ref 38–126)
BUN: 13 mg/dL (ref 6–20)
CO2: 24 mmol/L (ref 22–32)
Calcium: 9 mg/dL (ref 8.9–10.3)
Chloride: 104 mmol/L (ref 98–111)
Creatinine: 1.1 mg/dL (ref 0.61–1.24)
GFR, Est AFR Am: >60 mL/min (ref >60)
GFR, Estimated: >60 mL/min (ref >60)
Glucose, Bld: 97 mg/dL (ref 70–99)
POTASSIUM: 3.8 mmol/L (ref 3.5–5.1)
Sodium: 138 mmol/L (ref 135–145)
TOTAL PROTEIN: 7.3 g/dL (ref 6.5–8.1)
Total Bilirubin: 0.9 mg/dL (ref 0.3–1.2)

## 2019-01-21 LAB — CBC WITH DIFFERENTIAL (CANCER CENTER ONLY)
Abs Immature Granulocytes: 0.01 10*3/uL (ref 0.00–0.07)
Basophils Absolute: 0 10*3/uL (ref 0.0–0.1)
Basophils Relative: 1 %
Eosinophils Absolute: 0.2 10*3/uL (ref 0.0–0.5)
Eosinophils Relative: 4 %
HCT: 41 % (ref 39.0–52.0)
Hemoglobin: 14.9 g/dL (ref 13.0–17.0)
Immature Granulocytes: 0 %
Lymphocytes Relative: 30 %
Lymphs Abs: 1.1 10*3/uL (ref 0.7–4.0)
MCH: 35.6 pg — ABNORMAL HIGH (ref 26.0–34.0)
MCHC: 36.3 g/dL — ABNORMAL HIGH (ref 30.0–36.0)
MCV: 97.9 fL (ref 80.0–100.0)
Monocytes Absolute: 0.4 10*3/uL (ref 0.1–1.0)
Monocytes Relative: 11 %
NEUTROS ABS: 1.9 10*3/uL (ref 1.7–7.7)
Neutrophils Relative %: 54 %
PLATELETS: 235 10*3/uL (ref 150–400)
RBC: 4.19 MIL/uL — ABNORMAL LOW (ref 4.22–5.81)
RDW: 15 % (ref 11.5–15.5)
WBC Count: 3.6 10*3/uL — ABNORMAL LOW (ref 4.0–10.5)
nRBC: 0 % (ref 0.0–0.2)

## 2019-01-21 LAB — CEA (IN HOUSE-CHCC): CEA (CHCC-In House): 2.71 ng/mL (ref 0.00–5.00)

## 2019-01-21 MED ORDER — HEPARIN SOD (PORK) LOCK FLUSH 100 UNIT/ML IV SOLN
500.0000 [IU] | Freq: Once | INTRAVENOUS | Status: AC
  Administered 2019-01-21: 500 [IU]
  Filled 2019-01-21: qty 5

## 2019-01-21 MED ORDER — SODIUM CHLORIDE 0.9% FLUSH
10.0000 mL | Freq: Once | INTRAVENOUS | Status: AC
  Administered 2019-01-21: 10 mL
  Filled 2019-01-21: qty 10

## 2019-01-21 NOTE — Telephone Encounter (Signed)
Scheduled appt per 3/2 los. ° °Printed calendar and avs. °

## 2019-01-22 ENCOUNTER — Encounter: Payer: Self-pay | Admitting: Hematology

## 2019-02-08 ENCOUNTER — Inpatient Hospital Stay: Payer: Commercial Managed Care - PPO

## 2019-02-08 ENCOUNTER — Encounter: Payer: Self-pay | Admitting: Hematology

## 2019-02-08 ENCOUNTER — Inpatient Hospital Stay: Payer: Commercial Managed Care - PPO | Admitting: Hematology

## 2019-02-08 ENCOUNTER — Other Ambulatory Visit: Payer: Self-pay

## 2019-02-08 ENCOUNTER — Inpatient Hospital Stay (HOSPITAL_BASED_OUTPATIENT_CLINIC_OR_DEPARTMENT_OTHER): Payer: Commercial Managed Care - PPO | Admitting: Hematology

## 2019-02-08 ENCOUNTER — Telehealth: Payer: Self-pay | Admitting: Hematology

## 2019-02-08 VITALS — BP 135/89 | HR 84 | Temp 98.6°F | Resp 18 | Ht 76.0 in | Wt 221.3 lb

## 2019-02-08 DIAGNOSIS — I10 Essential (primary) hypertension: Secondary | ICD-10-CM

## 2019-02-08 DIAGNOSIS — Z95828 Presence of other vascular implants and grafts: Secondary | ICD-10-CM

## 2019-02-08 DIAGNOSIS — C19 Malignant neoplasm of rectosigmoid junction: Secondary | ICD-10-CM

## 2019-02-08 DIAGNOSIS — L271 Localized skin eruption due to drugs and medicaments taken internally: Secondary | ICD-10-CM | POA: Diagnosis not present

## 2019-02-08 DIAGNOSIS — C187 Malignant neoplasm of sigmoid colon: Secondary | ICD-10-CM

## 2019-02-08 LAB — CMP (CANCER CENTER ONLY)
ALT: 24 U/L (ref 0–44)
ANION GAP: 9 (ref 5–15)
AST: 26 U/L (ref 15–41)
Albumin: 4.2 g/dL (ref 3.5–5.0)
Alkaline Phosphatase: 76 U/L (ref 38–126)
BUN: 10 mg/dL (ref 6–20)
CO2: 26 mmol/L (ref 22–32)
Calcium: 9 mg/dL (ref 8.9–10.3)
Chloride: 107 mmol/L (ref 98–111)
Creatinine: 1.11 mg/dL (ref 0.61–1.24)
GFR, Est AFR Am: >60 mL/min (ref >60)
GFR, Estimated: >60 mL/min (ref >60)
Glucose, Bld: 95 mg/dL (ref 70–99)
POTASSIUM: 3.6 mmol/L (ref 3.5–5.1)
Sodium: 142 mmol/L (ref 135–145)
Total Bilirubin: 0.7 mg/dL (ref 0.3–1.2)
Total Protein: 7.2 g/dL (ref 6.5–8.1)

## 2019-02-08 LAB — CBC WITH DIFFERENTIAL (CANCER CENTER ONLY)
Abs Immature Granulocytes: 0.02 10*3/uL (ref 0.00–0.07)
Basophils Absolute: 0.1 10*3/uL (ref 0.0–0.1)
Basophils Relative: 1 %
Eosinophils Absolute: 0.2 10*3/uL (ref 0.0–0.5)
Eosinophils Relative: 4 %
HCT: 38.9 % — ABNORMAL LOW (ref 39.0–52.0)
Hemoglobin: 14 g/dL (ref 13.0–17.0)
Immature Granulocytes: 1 %
Lymphocytes Relative: 32 %
Lymphs Abs: 1.4 10*3/uL (ref 0.7–4.0)
MCH: 35.6 pg — AB (ref 26.0–34.0)
MCHC: 36 g/dL (ref 30.0–36.0)
MCV: 99 fL (ref 80.0–100.0)
Monocytes Absolute: 0.4 10*3/uL (ref 0.1–1.0)
Monocytes Relative: 9 %
Neutro Abs: 2.3 10*3/uL (ref 1.7–7.7)
Neutrophils Relative %: 53 %
Platelet Count: 214 10*3/uL (ref 150–400)
RBC: 3.93 MIL/uL — ABNORMAL LOW (ref 4.22–5.81)
RDW: 14.7 % (ref 11.5–15.5)
WBC Count: 4.3 10*3/uL (ref 4.0–10.5)
nRBC: 0 % (ref 0.0–0.2)

## 2019-02-08 MED ORDER — SODIUM CHLORIDE 0.9% FLUSH
10.0000 mL | Freq: Once | INTRAVENOUS | Status: AC
  Administered 2019-02-08: 10 mL
  Filled 2019-02-08: qty 10

## 2019-02-08 MED ORDER — HEPARIN SOD (PORK) LOCK FLUSH 100 UNIT/ML IV SOLN
500.0000 [IU] | Freq: Once | INTRAVENOUS | Status: AC
  Administered 2019-02-08: 500 [IU]
  Filled 2019-02-08: qty 5

## 2019-02-08 NOTE — Progress Notes (Signed)
[name]   [phone] [phone]   Clinic Follow up Note   Patient Care Team: Chesley Noon, MD as PCP - General (Family Medicine) [name], MD (Cardiology) [name], MD as Attending Physician (Gastroenterology)  Date of Service:  02/08/2019  CHIEF COMPLAINT:  f/u colon cancer, on adjuvant chemo  SUMMARY OF ONCOLOGIC HISTORY: Oncology History   Cancer Staging Cancer of sigmoid colon [facility]) Staging form: Colon and Rectum, AJCC 8th Edition - Pathologic stage from 08/07/2018: Stage IIIB (pT3, pN1a, cM0) - Signed by Truitt Merle, MD on 10/25/2018       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 07/05/18  IMPRESSION - Tumor in the rectum. Biopsied. - Diverticulosis in the entire examined colon. - Two 5 to 6 mm polyps, removed with a cold snare. Resected and retrieved. - The examination was otherwise normal on direct and retroflexion views.     07/05/2018 Initial Biopsy    Diagnosis 07/05/18 1. Colon, polyp(s), hepatic flexure - TUBULAR ADENOMA (ONE). - NO HIGH GRADE DYSPLASIA OR MALIGNANCY. 2. Rectum, biopsy, mass - INVASIVE MODERATELY DIFFERENTIATED COLORECTAL ADENOCARCINOMA.    07/10/2018 Imaging    CT CAP W Constrast 07/10/18  IMPRESSION: 1. Mass within the rectosigmoid colon is identified compatible with findings from recent colonoscopy. 2. No evidence for nodal metastasis or distant metastatic disease. 3. Left kidney cyst.    07/17/2018 Imaging    MRI PElvis 07/17/18  IMPRESSION: 4.2 cm polypoid lesion along the right lateral aspect of the upper rectum, corresponding to the patient's biopsy-proven rectal adenocarcinoma, as above. Rectal adenocarcinoma T stage:  T1 or T2 Rectal adenocarcinoma N stage:  N0 Distance from tumor to  the anal sphincter is 14 cm.    08/07/2018 Initial Diagnosis    Rectosigmoid cancer ([name])    08/07/2018 Surgery    XI ROBOTIC LOW ANTERIOR RESECTION by Dr. Kieth Brightly and Dr. Marcello Moores     08/07/2018 Pathologic Stage    Diagnosis 08/07/18 1. Colon, segmental resection for tumor, rectosigmoid - INVASIVE MODERATELY DIFFERENTIATED COLORECTAL ADENOCARCINOMA, 3.5 CM. - CARCINOMA FOCALLY INVOLVES PERICOLONIC CONNECTIVE TISSUE. - METASTATIC CARCINOMA IN ONE OF FIFTEEN LYMPH NODES (1/15). - MARGINS NOT INVOLVED. 2. Colon, resection margin (donut), distal ring, sigmoid - BENIGN COLON. - NO EVIDENCE OF MALIGNANCY.    08/07/2018 Cancer Staging    Staging form: Colon and Rectum, AJCC 8th Edition - Pathologic stage from 08/07/2018: Stage IIIB (pT3, pN1a, cM0) - Signed by Truitt Merle, MD on 10/25/2018    08/29/2018 -  Chemotherapy    Adjuvant CAPOX every 3 weeks with Xeloda '2000mg'$  twice daily 2 weeks on 1 week off starting 08/29/18. D/c Oxaliplatin after 09/28/18 (cycle 2) due to very poor toleration. Increased to '2000mg'$  in AM and '2500mg'$  in PM starting with cycle 5.     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  The Multi-Gene Panel offered by [facility] includes sequencing and/or deletion duplication testing of the following 84 genes: AIP, ALK, APC, ATM, AXIN2,BAP1,  BARD1, BLM, BMPR1A, BRCA1, BRCA2, BRIP1, CASR, CDC73, CDH1, CDK4, CDKN1B, CDKN1C, CDKN2A (p14ARF), CDKN2A (p16INK4a), CEBPA, CHEK2, CTNNA1, DICER1, DIS3L2, EGFR (c.2369C>T, p.Thr790Met variant only), EPCAM (Deletion/duplication testing only), FH, FLCN, GATA2, GPC3, GREM1 (Promoter region deletion/duplication testing only), HOXB13 (c.251G>A, p.Gly84Glu), HRAS,  KIT, MAX, MEN1, MET, MITF (c.952G>A, p.Glu318Lys variant only), MLH1, MSH2, MSH3, MSH6, MUTYH, NBN, NF1, NF2, NTHL1, PALB2, PDGFRA, PHOX2B, PMS2, POLD1, POLE, POT1, PRKAR1A, PTCH1, PTEN, RAD50, RAD51C, RAD51D, RB1, RECQL4, RET, RUNX1, SDHAF2, SDHA  (sequence changes only), SDHB, SDHC, SDHD, SMAD4, SMARCA4, SMARCB1, SMARCE1, STK11, SUFU, TERC, TERT, TMEM127, TP53, TSC1, TSC2, VHL, WRN and WT1.  The report date is 11/22/2018.      CURRENT THERAPY:  Adjuvant CAPOX,started on August 29, 2018, oxaliplatin stopped after 2 cycles, currently on Xeloda'2000mg'$  am and '2500mg'$  pm, day 1-14 every 21 days. Increased cycle 5 chemo withXeloda to '2000mg'$  in AM and '2500mg'$  in PM on 1/29.   INTERVAL HISTORY:  Jacob Hayes is here for a follow up of treatment. He presents to the clinic today by himself. He notes he has been working from home. He notes he continued cycle 7 Xeloda 2000 in the AM and '2500mg'$  in the PM given his pharmacy kept that refill dose. He notes he still has very dry, peeling cracked palms and feet. He still uses hydrocortisone. He is currently on his off week. His next cycle starts 3/24. He notes he has hair thinner.  He notes he was charged $1500 for genetic testing panel when it was suppose to be covered. He will contact Genetics.     REVIEW OF SYSTEMS:   Constitutional: Denies fevers, chills or abnormal weight loss Eyes: Denies blurriness of vision Ears, nose, mouth, throat, and face: Denies mucositis or sore throat Respiratory: Denies cough, dyspnea or wheezes Cardiovascular: Denies palpitation, chest discomfort or lower extremity swelling Gastrointestinal:  Denies nausea, heartburn or change in bowel habits Skin: Denies abnormal skin rashes (+) hand-foot syndrome (+) hair thinning  Lymphatics: Denies new lymphadenopathy or easy bruising Neurological:Denies numbness, tingling or new weaknesses Behavioral/Psych: Mood is stable, no new changes  All other systems were reviewed with the patient and are negative.  MEDICAL HISTORY:  Past Medical History:  Diagnosis Date  . Allergic rhinitis   . Anxiety   . Anxiety and depression    DENIES   . Bruxism   . Colon cancer ([place])   . Essential hypertension, benign   . Family history  of breast cancer   . Family history of melanoma   . GERD   . Hyperlipidemia    Since age 13  . Meniere's disease     SURGICAL HISTORY: Past Surgical History:  Procedure Laterality Date  . BIOPSY N/A 03/20/2013   Procedure: BIOPSY;  Surgeon: [name], MD;  [facility];  Service: Endoscopy;  Laterality: N/A;  Possible small bowel biopsy  . BIOPSY  07/05/2018   Procedure: BIOPSY;  Surgeon: [name], MD;  [facility];  Service: Endoscopy;;  rectal mass  . COLONOSCOPY WITH PROPOFOL N/A 07/05/2018   Procedure: COLONOSCOPY WITH PROPOFOL;  Surgeon: [name], MD;  [facility];  Service: Endoscopy;  Laterality: N/A;  8:30am  . ESOPHAGOGASTRODUODENOSCOPY N/A 03/20/2013   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  One slightly nodular.  Small hiatal hernia.  Multiple fundic gland appearing polyps. no evidence of celiac disease, malignancy, h.pylori, barrett's.   . None    . POLYPECTOMY  07/05/2018   Procedure: POLYPECTOMY;  Surgeon: [name], MD;  [facility];  Service: Endoscopy;;  hepatic flexurex2;  . PORTACATH PLACEMENT N/A 08/23/2018   Procedure: INSERTION PORT-A-CATH;  Surgeon: Marcello Moores,  Elmo Putt, MD;  Location: [facility];  Service: General;  Laterality: N/A;  LMA  . XI ROBOTIC ASSISTED LOWER ANTERIOR RESECTION      I have reviewed the social history and family history with the patient and they are unchanged from previous note.  ALLERGIES:  has No Known Allergies.  MEDICATIONS:  Current Outpatient Medications  Medication Sig Dispense Refill  . acetaminophen (TYLENOL) 500 MG tablet Take 500 mg by mouth every 6 (six) hours as needed for moderate pain or headache.     . Ascorbic Acid (VITAMIN C) 1000 MG tablet Take 1,000 mg by mouth daily.    Marland Kitchen b complex vitamins capsule Take 1 capsule by mouth daily.    . capecitabine  (XELODA) 500 MG tablet TAKE 4 TABLETS BY MOUTH IN THE MORNING AFTER MEAL AND 5 TABLETS IN THEEVENING AFTER A MEAL FOR 14 DAYS 126 tablet 0  . cetirizine (ZYRTEC) 10 MG tablet Take 10 mg by mouth daily as needed for allergies.     . diazepam (VALIUM) 5 MG tablet Take 2.5 mg by mouth at bedtime.   1  . diphenhydrAMINE (BENADRYL) 25 mg capsule Take 25 mg by mouth daily as needed for allergies.     . fluticasone (FLONASE) 50 MCG/ACT nasal spray Place 2 sprays into the nose daily. (Patient taking differently: Place 2 sprays into the nose daily as needed (for seasonal allergies.). ) 16 g 11  . hydrochlorothiazide (MICROZIDE) 12.5 MG capsule Take 12.5 mg by mouth daily.  5  . ibuprofen (ADVIL,MOTRIN) 400 MG tablet Take 1-2 tablets (400-800 mg total) by mouth every 6 (six) hours as needed for fever, headache, mild pain or moderate pain. 30 tablet 0  . lidocaine-prilocaine (EMLA) cream Apply small amount over port site and cover with plastic wrap one hour prior to appointment. 30 g 3  . LORazepam (ATIVAN) 1 MG tablet Take 1 tablet (1 mg total) by mouth every 8 (eight) hours as needed for anxiety. 30 tablet 0  . losartan (COZAAR) 100 MG tablet Take 100 mg by mouth daily.   5  . Magnesium 250 MG TABS Take 250 mg by mouth 2 (two) times daily.     . methimazole (TAPAZOLE) 5 MG tablet Take 5 mg by mouth 2 (two) times daily.     . Multiple Vitamin (MULTIVITAMIN WITH MINERALS) TABS tablet Take 1 tablet by mouth daily. Men's One-A-Day    . ondansetron (ZOFRAN) 8 MG tablet Take 1 tablet (8 mg total) by mouth every 8 (eight) hours as needed for nausea or vomiting. 30 tablet 1  . pantoprazole (PROTONIX) 40 MG tablet TAKE ONE TABLET BY MOUTH TWICE DAILY BEFORE A MEAL (Patient taking differently: Take 40 mg by mouth daily. ) 60 tablet 0  . prochlorperazine (COMPAZINE) 10 MG tablet Take 1 tablet (10 mg total) by mouth every 6 (six) hours as needed (Nausea or vomiting). 30 tablet 1  . simvastatin (ZOCOR) 40 MG tablet Take  40 mg by mouth every evening.     . urea (CARMOL) 10 % cream Apply topically 2 (two) times daily. 71 g 1   No current facility-administered medications for this visit.     PHYSICAL EXAMINATION: ECOG PERFORMANCE STATUS: 1 - Symptomatic but completely ambulatory  Vitals:   02/08/19 1352  BP: 135/89  Pulse: 84  Resp: 18  Temp: 98.6 F (37 C)  SpO2: 100%   Filed Weights   02/08/19 1352  Weight: 221 lb 4.8 oz (100.4 kg)    GENERAL:alert,  no distress and comfortable SKIN: skin color, texture, turgor are normal, no rashes or significant lesions (+)Dry peeling cracked palms EYES: normal, Conjunctiva are pink and non-injected, sclera clear OROPHARYNX:no exudate, no erythema and lips, buccal mucosa, and tongue normal  NECK: supple, thyroid normal size, non-tender, without nodularity LYMPH:  no palpable lymphadenopathy in the cervical, axillary or inguinal LUNGS: clear to auscultation and percussion with normal breathing effort HEART: regular rate & rhythm and no murmurs and no lower extremity edema ABDOMEN:abdomen soft, non-tender and normal bowel sounds Musculoskeletal:no cyanosis of digits and no clubbing  NEURO: alert & oriented x 3 with fluent speech, no focal motor/sensory deficits  LABORATORY DATA:  I have reviewed the data as listed CBC Latest Ref Rng & Units 02/08/2019 01/21/2019 12/28/2018  WBC 4.0 - 10.5 K/uL 4.3 3.6(L) 3.9(L)  Hemoglobin 13.0 - 17.0 g/dL 14.0 14.9 14.4  Hematocrit 39.0 - 52.0 % 38.9(L) 41.0 40.1  Platelets 150 - 400 K/uL 214 235 212     CMP Latest Ref Rng & Units 02/08/2019 01/21/2019 12/28/2018  Glucose 70 - 99 mg/dL 95 97 99  BUN 6 - 20 mg/dL _0 Creatinine 0.61 - 1.24 mg/dL 1.11 1.10 1.01  Sodium 135 - 145 mmol/L 142 138 140  Potassium 3.5 - 5.1 mmol/L 3.6 3.8 3.7  Chloride 98 - 111 mmol/L 107 104 106  CO2 22 - 32 mmol/L _1 Calcium 8.9 - 10.3 mg/dL 9.0 9.0 8.8(L)  Total Protein 6.5 - 8.1 g/dL 7.2 7.3 7.3  Total Bilirubin 0.3 - 1.2 mg/dL  0.7 0.9 0.7  Alkaline Phos 38 - 126 U/L 76 84 80  AST 15 - 41 U/L 26 35 25  ALT 0 - 44 U/L 24 43 31      RADIOGRAPHIC STUDIES: I have personally reviewed the radiological images as listed and agreed with the findings in the report. No results found.   ASSESSMENT & PLAN:  Jacob Hayes is a 38 y.o. male with   1. Rectosigmoid Cancer,Stage IIIB(pT3N1M0), MMR normal -He was diagnosed in 06/2018.He is s/p anterior resectionwith clear margins.Due to the tumor location mainly in sigmoid colon, radiation was not recommended. -Due to the high risk of recurrence, he started adjuvant chemotherapy with Capeox. However he toleratedOxaliplatinpoorly and was stopped after 2 cycles. He is toleratingXelodawell,plan for atotal of 8cycles (including CAPOX).He tried dose increase starting with cycle 5.  -Per pt he chose to continue with Xeloda 206m in the AM and 25016min the PM Hand-foot syndrome overall stable. I encouraged he continue hydrocortisone and use Vaseline.  -Labs reviewed, CBC and CMP WNL overall. Will proceed with cycle 8 Xeloda at same dose on 02/12/19. If peeling not healing completely he can wait a few more days to start.  --I discussed the risk of cancer recurrence in the future. I discussed the surveillance plan, which is a physical exam and lab test (including CBC, CMP and CEA) every 3 months for the first 2 years, then every 6-12 months, colonoscopy in one year, and surveilliance CT scan every 6-12 month for up to 5 year.  -F/u in 6 weeks with lab and first surveillance CT scan    2. HTN -On Losartan and HCTZ -BP controlled -Continue to f/u with Dr. McDomenic Polite  3. Hand/foot syndrome  -He has developed significantskinerythema and mild skin crackling on his palms and bottom of feet, no blisters or skin peeling. This is still moderate as he did not dose reduce with cycle 6  He opted to continue current dose with cycle 7 and 8.  -He notes Urea cream is not as effective  for him. I encouraged him to continue hand moisturizer such as Vaseline and hydrocortisone cream.   PLAN: -Labs reviewed and adequate to proceed with cycle 8 (last cycle) Xeloda 2057m in the AM and 25011min the PM  On 02/12/19 -F/u in 6 weeks with lab and CT CAP with contrast a few days before    No problem-specific Assessment & Plan notes found for this encounter.   Orders Placed This Encounter  Procedures  . CT Abdomen Pelvis W Contrast    [facility] or other radiology site    Standing Status:   Future    Standing Expiration Date:   02/08/2020    Order Specific Question:   If indicated for the ordered procedure, I authorize the administration of contrast media per Radiology protocol    Answer:   Yes    Order Specific Question:   [facility]    [facility]  Order Specific Question:   Is Oral Contrast requested for this exam?    Answer:   Yes, Per Radiology protocol    Order Specific Question:   Radiology Contrast Protocol - do NOT remove file path    Answer:   \\[email or web address]  . CT Chest W Contrast    Standing Status:   Future    Standing Expiration Date:   02/08/2020    Order Specific Question:   If indicated for the ordered procedure, I authorize the administration of contrast media per Radiology protocol    Answer:   Yes    Order Specific Question:   [facility]?    [facility]  Order Specific Question:   Radiology Contrast Protocol - do NOT remove file path    Answer:   \\[email or web address]   All questions were answered. The patient knows to call the clinic with any problems, questions or concerns. No barriers to learning was detected. I spent 20 minutes counseling the patient face to face. The total time spent in the appointment was 25 minutes and more than 50% was on counseling and review of test results     YaTruitt MerleMD 02/08/2019   I, AmJoslyn Devonam  acting as scribe for YaTruitt MerleMD.   I have reviewed the above documentation for accuracy and completeness, and I agree with the above.

## 2019-02-08 NOTE — Telephone Encounter (Signed)
Gave contrast, declined printout

## 2019-02-11 ENCOUNTER — Other Ambulatory Visit: Payer: Self-pay | Admitting: Hematology

## 2019-02-11 DIAGNOSIS — C187 Malignant neoplasm of sigmoid colon: Secondary | ICD-10-CM

## 2019-03-18 ENCOUNTER — Telehealth: Payer: Self-pay | Admitting: *Deleted

## 2019-03-18 NOTE — Telephone Encounter (Signed)
TCT patient regarding his upcoming appts.. He is ok with [facility] call on Friday to review labs and CT scan.  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 He is working from home and would eventually like to go back to the office to work, but is mindful of the current COVID 19 situation and his immunocompromised health status.

## 2019-03-20 ENCOUNTER — Telehealth: Payer: Self-pay | Admitting: Hematology

## 2019-03-20 ENCOUNTER — Inpatient Hospital Stay: Payer: Commercial Managed Care - PPO

## 2019-03-20 ENCOUNTER — Inpatient Hospital Stay: Payer: Commercial Managed Care - PPO | Attending: Hematology

## 2019-03-20 ENCOUNTER — Encounter (HOSPITAL_COMMUNITY): Payer: Self-pay

## 2019-03-20 ENCOUNTER — Ambulatory Visit (HOSPITAL_COMMUNITY)
Admission: RE | Admit: 2019-03-20 | Discharge: 2019-03-20 | Disposition: A | Discharge status: Home / Self Care | Payer: Commercial Managed Care - PPO | Source: Ambulatory Visit | Attending: Hematology | Admitting: Hematology

## 2019-03-20 ENCOUNTER — Other Ambulatory Visit: Payer: Self-pay

## 2019-03-20 ENCOUNTER — Ambulatory Visit (HOSPITAL_COMMUNITY): Admission: RE | Admit: 2019-03-20 | Payer: Commercial Managed Care - PPO | Source: Ambulatory Visit

## 2019-03-20 ENCOUNTER — Ambulatory Visit (HOSPITAL_COMMUNITY): Payer: Commercial Managed Care - PPO

## 2019-03-20 DIAGNOSIS — C187 Malignant neoplasm of sigmoid colon: Secondary | ICD-10-CM | POA: Diagnosis not present

## 2019-03-20 DIAGNOSIS — C19 Malignant neoplasm of rectosigmoid junction: Secondary | ICD-10-CM | POA: Diagnosis not present

## 2019-03-20 DIAGNOSIS — Z95828 Presence of other vascular implants and grafts: Secondary | ICD-10-CM

## 2019-03-20 LAB — CMP (CANCER CENTER ONLY)
ALT: 27 U/L (ref 0–44)
AST: 25 U/L (ref 15–41)
Albumin: 4 g/dL (ref 3.5–5.0)
Alkaline Phosphatase: 85 U/L (ref 38–126)
Anion gap: 9 (ref 5–15)
BUN: 12 mg/dL (ref 6–20)
CO2: 25 mmol/L (ref 22–32)
Calcium: 8.3 mg/dL — ABNORMAL LOW (ref 8.9–10.3)
Chloride: 106 mmol/L (ref 98–111)
Creatinine: 0.99 mg/dL (ref 0.61–1.24)
GFR, Est AFR Am: >60 mL/min (ref >60)
GFR, Estimated: >60 mL/min (ref >60)
Glucose, Bld: 90 mg/dL (ref 70–99)
Potassium: 3.6 mmol/L (ref 3.5–5.1)
Sodium: 140 mmol/L (ref 135–145)
Total Bilirubin: 0.6 mg/dL (ref 0.3–1.2)
Total Protein: 7.1 g/dL (ref 6.5–8.1)

## 2019-03-20 LAB — CBC WITH DIFFERENTIAL (CANCER CENTER ONLY)
Abs Immature Granulocytes: 0 10*3/uL (ref 0.00–0.07)
Basophils Absolute: 0 10*3/uL (ref 0.0–0.1)
Basophils Relative: 1 %
Eosinophils Absolute: 0.2 10*3/uL (ref 0.0–0.5)
Eosinophils Relative: 5 %
HCT: 39.2 % (ref 39.0–52.0)
Hemoglobin: 13.8 g/dL (ref 13.0–17.0)
Immature Granulocytes: 0 %
Lymphocytes Relative: 29 %
Lymphs Abs: 1.1 10*3/uL (ref 0.7–4.0)
MCH: 35.7 pg — ABNORMAL HIGH (ref 26.0–34.0)
MCHC: 35.2 g/dL (ref 30.0–36.0)
MCV: 101.3 fL — ABNORMAL HIGH (ref 80.0–100.0)
Monocytes Absolute: 0.4 10*3/uL (ref 0.1–1.0)
Monocytes Relative: 11 %
Neutro Abs: 2 10*3/uL (ref 1.7–7.7)
Neutrophils Relative %: 54 %
Platelet Count: 206 10*3/uL (ref 150–400)
RBC: 3.87 MIL/uL — ABNORMAL LOW (ref 4.22–5.81)
RDW: 12.7 % (ref 11.5–15.5)
WBC Count: 3.6 10*3/uL — ABNORMAL LOW (ref 4.0–10.5)
nRBC: 0 % (ref 0.0–0.2)

## 2019-03-20 MED ORDER — SODIUM CHLORIDE (PF) 0.9 % IJ SOLN
INTRAMUSCULAR | Status: AC
  Filled 2019-03-20: qty 50

## 2019-03-20 MED ORDER — HEPARIN SOD (PORK) LOCK FLUSH 100 UNIT/ML IV SOLN
500.0000 [IU] | Freq: Once | INTRAVENOUS | Status: AC
  Administered 2019-03-20: 10:00:00 500 [IU] via INTRAVENOUS

## 2019-03-20 MED ORDER — SODIUM CHLORIDE 0.9% FLUSH
10.0000 mL | Freq: Once | INTRAVENOUS | Status: AC
  Administered 2019-03-20: 10 mL
  Filled 2019-03-20: qty 10

## 2019-03-20 MED ORDER — HEPARIN SOD (PORK) LOCK FLUSH 100 UNIT/ML IV SOLN
INTRAVENOUS | Status: AC
  Filled 2019-03-20: qty 5

## 2019-03-20 MED ORDER — IOHEXOL 300 MG/ML  SOLN
100.0000 mL | Freq: Once | INTRAMUSCULAR | Status: AC | PRN
  Administered 2019-03-20: 10:00:00 100 mL via INTRAVENOUS

## 2019-03-20 NOTE — Telephone Encounter (Signed)
Called regarding upcoming [facility] appointment, left voicemail for patient and e-mail has been sent.

## 2019-03-21 NOTE — Progress Notes (Signed)
[place]   Telephone:([phone] Fax:(336) [phone]   Clinic Follow up Note   Patient Care Team: Chesley Noon, MD as PCP - General (Family Medicine) [name], MD (Cardiology) [name], MD as Attending Physician (Gastroenterology)   I connected with Jacob Hayes on 03/22/2019 at  8:15 AM EDT by video enabled telemedicine visit and verified that I am speaking with the correct person using two identifiers.  I discussed the limitations, risks, security and privacy concerns of performing an evaluation and management service by telephone and the availability of in person appointments. I also discussed with the patient that there may be a patient responsible charge related to this service. The patient expressed understanding and agreed to proceed.   Other persons participating in the visit and their role in the encounter:  His girlfriend Jacob Hayes.  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 07/05/18  IMPRESSION - Tumor in the rectum. Biopsied. - Diverticulosis in the entire examined colon. - Two 5 to 6 mm polyps, removed with a cold snare. Resected and retrieved. - The examination was otherwise normal on direct and retroflexion views.     07/05/2018 Initial Biopsy    Diagnosis 07/05/18 1. Colon, polyp(s), hepatic flexure - TUBULAR ADENOMA (ONE). - NO HIGH GRADE DYSPLASIA OR MALIGNANCY. 2. Rectum,  biopsy, mass - INVASIVE MODERATELY DIFFERENTIATED COLORECTAL ADENOCARCINOMA.    07/10/2018 Imaging    CT CAP W Constrast 07/10/18  IMPRESSION: 1. Mass within the rectosigmoid colon is identified compatible with findings from recent colonoscopy. 2. No evidence for nodal metastasis or distant metastatic disease. 3. Left kidney cyst.    07/17/2018 Imaging    MRI PElvis 07/17/18  IMPRESSION: 4.2 cm polypoid lesion along the right lateral aspect of the upper rectum, corresponding to the patient's biopsy-proven rectal adenocarcinoma, as above. Rectal adenocarcinoma T stage:  T1 or T2 Rectal adenocarcinoma N stage:  N0 Distance from tumor to the anal sphincter is 14 cm.    08/07/2018 Initial Diagnosis    Rectosigmoid cancer ([place])    08/07/2018 Surgery    XI ROBOTIC LOW ANTERIOR RESECTION by Dr. Kieth Brightly and Dr. Marcello Moores     08/07/2018 Pathologic Stage    Diagnosis 08/07/18 1. Colon, segmental resection for tumor, rectosigmoid - INVASIVE MODERATELY DIFFERENTIATED COLORECTAL ADENOCARCINOMA, 3.5 CM. - CARCINOMA FOCALLY INVOLVES PERICOLONIC CONNECTIVE TISSUE. - METASTATIC CARCINOMA IN ONE OF FIFTEEN LYMPH NODES (1/15). - MARGINS NOT INVOLVED. 2. Colon, resection margin (donut), distal ring, sigmoid - BENIGN COLON. - NO EVIDENCE OF MALIGNANCY.    08/07/2018 Cancer Staging    Staging form: Colon and Rectum, AJCC 8th Edition - Pathologic stage from 08/07/2018: Stage IIIB (pT3, pN1a, cM0) - Signed by Truitt Merle, MD on 10/25/2018    08/29/2018 - 02/26/2019 Chemotherapy    Adjuvant CAPOX every 3 weeks with Xeloda 2068m twice daily 2 weeks on 1 week off starting 08/29/18. D/c Oxaliplatin after 09/28/18 (  cycle 2) due to very poor toleration. Increased to '2000mg'$  in AM and '2500mg'$  in PM starting with cycle 5.  Last 8th cycle dose was taken 02/26/19.     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  The Multi-Gene Panel offered by [facility] includes sequencing  and/or deletion duplication testing of the following 84 genes: AIP, ALK, APC, ATM, AXIN2,BAP1,  BARD1, BLM, BMPR1A, BRCA1, BRCA2, BRIP1, CASR, CDC73, CDH1, CDK4, CDKN1B, CDKN1C, CDKN2A (p14ARF), CDKN2A (p16INK4a), CEBPA, CHEK2, CTNNA1, DICER1, DIS3L2, EGFR (c.2369C>T, p.Thr790Met variant only), EPCAM (Deletion/duplication testing only), FH, FLCN, GATA2, GPC3, GREM1 (Promoter region deletion/duplication testing only), HOXB13 (c.251G>A, p.Gly84Glu), HRAS, KIT, MAX, MEN1, MET, MITF (c.952G>A, p.Glu318Lys variant only), MLH1, MSH2, MSH3, MSH6, MUTYH, NBN, NF1, NF2, NTHL1, PALB2, PDGFRA, PHOX2B, PMS2, POLD1, POLE, POT1, PRKAR1A, PTCH1, PTEN, RAD50, RAD51C, RAD51D, RB1, RECQL4, RET, RUNX1, SDHAF2, SDHA (sequence changes only), SDHB, SDHC, SDHD, SMAD4, SMARCA4, SMARCB1, SMARCE1, STK11, SUFU, TERC, TERT, TMEM127, TP53, TSC1, TSC2, VHL, WRN and WT1.  The report date is 11/22/2018.    03/20/2019 Imaging    CT CAP 03/20/19  IMPRESSION: 1.  Interval postoperative findings of rectal resection.  2. No definite evidence of metastatic disease in the chest, abdomen, or pelvis.  3. No change in small prominent retroperitoneal lymph nodes. Attention on follow-up.  4. There is a subtle subcapsular hyperdensity of the dome of the liver, likely focal fatty sparing (series 2, image 49). Attention on follow-up.      CURRENT THERAPY:  Surveillance    INTERVAL HISTORY:  Jacob Hayes is here for a follow up of his. He was able to identify himself by birth date. He notes he is doing well. He notes has been anxious about his scan. He notes he has been working from home but now he notes that he was asked to return to his work site. He notes he has not been out of the house in 1.5 months publicly and has been cautious. He notes his office setting is not spaced out but he is able to work in a more separated place. 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 He notes he cannot use his finger print on his phone and  he feel 2 weeks ago. He feels he broke a rib and had pain of up lower right ribcage and flank. Otherwise her has been able to walk and jog around the neighborhood. He notes he would need a letter to return to go back to work.  He notes his genetic testing was not covered by his insurance. [employer] contacted him about paying a portion of the $1500 fee.      REVIEW OF SYSTEMS:   Constitutional: Denies fevers, chills or abnormal weight loss Eyes: Denies blurriness of vision Ears, nose, mouth, throat, and face: Denies mucositis or sore throat Respiratory: Denies cough, dyspnea or wheezes Cardiovascular: Denies palpitation, chest discomfort or lower extremity swelling Gastrointestinal:  Denies nausea, heartburn or change in bowel habits Skin: Denies abnormal skin rashes (+) hand-foot syndrome  MSK: (+) Pain over right lower ribcage Lymphatics: Denies new lymphadenopathy or easy bruising Neurological:Denies numbness, tingling or new weaknesses Behavioral/Psych: Mood is stable, no new changes  All other systems were reviewed with the patient and are negative.  MEDICAL HISTORY:  Past Medical History:  Diagnosis Date   Allergic rhinitis    Anxiety    Anxiety and depression    DENIES    Bruxism    Colon cancer ([place])  Essential hypertension, benign    Family history of breast cancer    Family history of melanoma    GERD    Hyperlipidemia    Since age 47   Meniere's disease     SURGICAL HISTORY: Past Surgical History:  Procedure Laterality Date   BIOPSY N/A 03/20/2013   Procedure: BIOPSY;  Surgeon: [name], MD;  [facility];  Service: Endoscopy;  Laterality: N/A;  Possible small bowel biopsy   BIOPSY  07/05/2018   Procedure: BIOPSY;  Surgeon: [name], MD;  [facility];  Service: Endoscopy;;  rectal mass   COLONOSCOPY WITH PROPOFOL N/A 07/05/2018   Procedure: COLONOSCOPY WITH PROPOFOL;  Surgeon: [name], MD;  Location: [facility];  Service: Endoscopy;  Laterality: N/A;  8:30am   ESOPHAGOGASTRODUODENOSCOPY N/A 03/20/2013   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  One slightly nodular.  Small hiatal hernia.  Multiple fundic gland appearing polyps. no evidence of celiac disease, malignancy, h.pylori, barrett's.    None     POLYPECTOMY  07/05/2018   Procedure: POLYPECTOMY;  Surgeon: [name], MD;  [facility];  Service: Endoscopy;;  hepatic flexurex2;   PORTACATH PLACEMENT N/A 08/23/2018   Procedure: INSERTION PORT-A-CATH;  Surgeon: [name], MD;  Location: [facility];  Service: General;  Laterality: N/A;  LMA   XI ROBOTIC ASSISTED LOWER ANTERIOR RESECTION      I have reviewed the social history and family history with the patient and they are unchanged from previous note.  ALLERGIES:  has No Known Allergies.  MEDICATIONS:  Current Outpatient Medications  Medication Sig Dispense Refill   acetaminophen (TYLENOL) 500 MG tablet Take 500 mg by mouth every 6 (six) hours as needed for moderate pain or headache.      Ascorbic Acid (VITAMIN C) 1000 MG tablet Take 1,000 mg by mouth daily.     b complex vitamins capsule Take 1 capsule by mouth daily.     capecitabine (XELODA) 500 MG tablet TAKE 4 TABLETS BY MOUTH IN THE MORNING AFTER MEAL AND 5 TABLETS IN THEEVENING AFTER A MEAL FOR 14 DAYS 126 tablet 0   cetirizine (ZYRTEC) 10 MG tablet Take 10 mg by mouth daily as needed for allergies.      diazepam (VALIUM) 5 MG tablet Take 2.5 mg by mouth at bedtime.   1   diphenhydrAMINE (BENADRYL) 25 mg capsule Take 25 mg by mouth daily as needed for allergies.      fluticasone (FLONASE) 50 MCG/ACT nasal spray Place 2 sprays into the nose daily. (Patient taking differently: Place 2 sprays into the nose daily as needed (for seasonal allergies.). ) 16 g 11   hydrochlorothiazide (MICROZIDE)  12.5 MG capsule Take 12.5 mg by mouth daily.  5   ibuprofen (ADVIL,MOTRIN) 400 MG tablet Take 1-2 tablets (400-800 mg total) by mouth every 6 (six) hours as needed for fever, headache, mild pain or moderate pain. 30 tablet 0   lidocaine-prilocaine (EMLA) cream Apply small amount over port site and cover with plastic wrap one hour prior to appointment. 30 g 3   LORazepam (ATIVAN) 1 MG tablet Take 1 tablet (1 mg total) by mouth every 8 (eight) hours as needed for anxiety. 30 tablet 0   losartan (COZAAR) 100 MG tablet Take 100 mg by mouth daily.   5   Magnesium 250 MG TABS Take 250 mg  by mouth 2 (two) times daily.      methimazole (TAPAZOLE) 5 MG tablet Take 5 mg by mouth 2 (two) times daily.      Multiple Vitamin (MULTIVITAMIN WITH MINERALS) TABS tablet Take 1 tablet by mouth daily. Men's One-A-Day     ondansetron (ZOFRAN) 8 MG tablet Take 1 tablet (8 mg total) by mouth every 8 (eight) hours as needed for nausea or vomiting. 30 tablet 1   pantoprazole (PROTONIX) 40 MG tablet TAKE ONE TABLET BY MOUTH TWICE DAILY BEFORE A MEAL (Patient taking differently: Take 40 mg by mouth daily. ) 60 tablet 0   prochlorperazine (COMPAZINE) 10 MG tablet Take 1 tablet (10 mg total) by mouth every 6 (six) hours as needed (Nausea or vomiting). 30 tablet 1   simvastatin (ZOCOR) 40 MG tablet Take 40 mg by mouth every evening.      urea (CARMOL) 10 % cream Apply topically 2 (two) times daily. 71 g 1   No current facility-administered medications for this visit.     PHYSICAL EXAMINATION: ECOG PERFORMANCE STATUS: 1 - Symptomatic but completely ambulatory  No vitals taken today, Exam not performed today  LABORATORY DATA:  I have reviewed the data as listed CBC Latest Ref Rng & Units 03/20/2019 02/08/2019 01/21/2019  WBC 4.0 - 10.5 K/uL 3.6(L) 4.3 3.6(L)  Hemoglobin 13.0 - 17.0 g/dL 13.8 14.0 14.9  Hematocrit 39.0 - 52.0 % 39.2 38.9(L) 41.0  Platelets 150 - 400 K/uL 206 214 235     CMP Latest Ref Rng &  Units 03/20/2019 02/08/2019 01/21/2019  Glucose 70 - 99 mg/dL 90 95 97  BUN 6 - 20 mg/dL _0 Creatinine 0.61 - 1.24 mg/dL 0.99 1.11 1.10  Sodium 135 - 145 mmol/L 140 142 138  Potassium 3.5 - 5.1 mmol/L 3.6 3.6 3.8  Chloride 98 - 111 mmol/L 106 107 104  CO2 22 - 32 mmol/L _1 Calcium 8.9 - 10.3 mg/dL 8.3(L) 9.0 9.0  Total Protein 6.5 - 8.1 g/dL 7.1 7.2 7.3  Total Bilirubin 0.3 - 1.2 mg/dL 0.6 0.7 0.9  Alkaline Phos 38 - 126 U/L 85 76 84  AST 15 - 41 U/L 25 26 35  ALT 0 - 44 U/L 27 24 43      RADIOGRAPHIC STUDIES: I have personally reviewed the radiological images as listed and agreed with the findings in the report. Ct Chest W Contrast  Result Date: 03/20/2019 CLINICAL DATA:  Colon cancer, status post colectomy, chemotherapy EXAM: CT CHEST, ABDOMEN, AND PELVIS WITH CONTRAST TECHNIQUE: Multidetector CT imaging of the chest, abdomen and pelvis was performed following the standard protocol during bolus administration of intravenous contrast. CONTRAST:  120m OMNIPAQUE IOHEXOL 300 MG/ML SOLN, additional oral enteric contrast COMPARISON:  07/10/2018 FINDINGS: CT CHEST FINDINGS Cardiovascular: No significant vascular findings. Normal heart size. No pericardial effusion. Right chest port catheter. Mediastinum/Nodes: No enlarged mediastinal, hilar, or axillary lymph nodes. Thyroid gland, trachea, and esophagus demonstrate no significant findings. Lungs/Pleura: Lungs are clear. No pleural effusion or pneumothorax. Musculoskeletal: No chest wall mass or suspicious bone lesions identified. CT ABDOMEN PELVIS FINDINGS Hepatobiliary: There is a subtle subcapsular hyperdensity of the dome of the liver, likely focal fatty sparing (series 2, image 49). No gallstones, gallbladder wall thickening, or biliary dilatation. Pancreas: Unremarkable. No pancreatic ductal dilatation or surrounding inflammatory changes. Spleen: Normal in size without focal abnormality. Adrenals/Urinary Tract: Adrenal glands are  unremarkable. Kidneys are normal, without renal calculi, focal lesion, or hydronephrosis. Bladder is unremarkable.  Stomach/Bowel: Stomach is within normal limits. Appendix appears normal. No evidence of bowel wall thickening, distention, or inflammatory changes. Status post interval rectal resection. Vascular/Lymphatic: No significant vascular findings are present. No change in small prominent retroperitoneal lymph nodes (series 2, image 81). Reproductive: No mass or other abnormality. Other: No abdominal wall hernia or abnormality. No abdominopelvic ascites. Musculoskeletal: No acute or significant osseous findings. IMPRESSION: 1.  Interval postoperative findings of rectal resection. 2. No definite evidence of metastatic disease in the chest, abdomen, or pelvis. 3. No change in small prominent retroperitoneal lymph nodes. Attention on follow-up. 4. There is a subtle subcapsular hyperdensity of the dome of the liver, likely focal fatty sparing (series 2, image 49). Attention on follow-up. Electronically Signed   By: Eddie Candle M.D.   On: 03/20/2019 10:52   Ct Abdomen Pelvis W Contrast  Result Date: 03/20/2019 CLINICAL DATA:  Colon cancer, status post colectomy, chemotherapy EXAM: CT CHEST, ABDOMEN, AND PELVIS WITH CONTRAST TECHNIQUE: Multidetector CT imaging of the chest, abdomen and pelvis was performed following the standard protocol during bolus administration of intravenous contrast. CONTRAST:  13m OMNIPAQUE IOHEXOL 300 MG/ML SOLN, additional oral enteric contrast COMPARISON:  07/10/2018 FINDINGS: CT CHEST FINDINGS Cardiovascular: No significant vascular findings. Normal heart size. No pericardial effusion. Right chest port catheter. Mediastinum/Nodes: No enlarged mediastinal, hilar, or axillary lymph nodes. Thyroid gland, trachea, and esophagus demonstrate no significant findings. Lungs/Pleura: Lungs are clear. No pleural effusion or pneumothorax. Musculoskeletal: No chest wall mass or suspicious bone  lesions identified. CT ABDOMEN PELVIS FINDINGS Hepatobiliary: There is a subtle subcapsular hyperdensity of the dome of the liver, likely focal fatty sparing (series 2, image 49). No gallstones, gallbladder wall thickening, or biliary dilatation. Pancreas: Unremarkable. No pancreatic ductal dilatation or surrounding inflammatory changes. Spleen: Normal in size without focal abnormality. Adrenals/Urinary Tract: Adrenal glands are unremarkable. Kidneys are normal, without renal calculi, focal lesion, or hydronephrosis. Bladder is unremarkable. Stomach/Bowel: Stomach is within normal limits. Appendix appears normal. No evidence of bowel wall thickening, distention, or inflammatory changes. Status post interval rectal resection. Vascular/Lymphatic: No significant vascular findings are present. No change in small prominent retroperitoneal lymph nodes (series 2, image 81). Reproductive: No mass or other abnormality. Other: No abdominal wall hernia or abnormality. No abdominopelvic ascites. Musculoskeletal: No acute or significant osseous findings. IMPRESSION: 1.  Interval postoperative findings of rectal resection. 2. No definite evidence of metastatic disease in the chest, abdomen, or pelvis. 3. No change in small prominent retroperitoneal lymph nodes. Attention on follow-up. 4. There is a subtle subcapsular hyperdensity of the dome of the liver, likely focal fatty sparing (series 2, image 49). Attention on follow-up. Electronically Signed   By: AEddie CandleM.D.   On: 03/20/2019 10:52     ASSESSMENT & PLAN:  Jacob GULINOis a 38y.o. male with   1. Rectosigmoid Cancer,Stage IIIB(pT3N1M0), MMR normal -He was diagnosed in 06/2018.He is s/p anterior resectionwith clear margins.Due to the tumor location mainly in sigmoid colon, radiation was not recommended. -Due to the high risk of recurrence, he started adjuvant chemotherapy with CAPOX. However he toleratedOxaliplatinpoorly and was stopped after 2 cycles.  He is toleratingXelodawell,plan for atotal of 8cycles (including CAPOX).He increased dose to Xeloda 20024min the AM and 250055mn the PM starting with cycle 5. He tolerated moderately well with Hand-foot syndrome. He completed 02/26/19.  -I personally We discussed his CT CAP from 03/20/19 which shows no evidence of cancer recurrence. There are prominent stable retroperitoneal LN, likely not related  to his cancer. There is incidental findings of subtle subcapsular hyperdensity of the dome of the liver, likely benign. Will monitor in the future. -Labs from this week reviewed, CBC and CMP WNL except WBC 3.6, Ca 8.3. He still has significant hand-foot syndrome which has affected his finger function and balance. He fell 2 weeks ago and has pain over his right lower ribcage.  -I discussed given he recently completed chemo he is still at risk for infection. I discussed after 3 months the risk of bacteria infection is much lower and risk of virus infection is can last 1-3 years. Based on his overall health he, would be fine to return to work in 2-3 weeks. I strongly encouraged him to continue COVID-19 precautions and social distancing as much as possible.  -I discussed the risk of cancer recurrence in the future. I discussed the surveillance plan, which is a physical exam and lab test (including CBC, CMP and CEA) every 3 months for the first 2 years, then every 6-12 months, colonoscopy in 07/2019, and surveillance CT scan every 6-12 month for up to 5 year.  -He will consider port removal after next CT scan which is reasonable. Continue port flush every 6-8 weeks. -F/u in 3 months    2. HTN -On Losartan and HCTZ -BP controlled  -Continue to f/u with Dr. Domenic Polite.   3. Genetic Negative  -His testing cost $1500 which his insurance did not cover. [employer] contacted him about paying $100 of the cost as payment.  -I will discuss with our genetic counselor an appeal process with his insurance.   4. Hand/foot  syndrome  -He has developed significantskinerythema and mild skin crackling on his palms and bottom of feet, no blisters or skin peeling. This is still moderate as he did not dose reduce with cycle 6 He opted to continue current dose with cycle 7 and 8.  -He notes Urea cream is not as effective for him. I encouraged him to continue hand moisturizer such as Vaseline and hydrocortisone cream.  -Hand-foot syndrome still significant. Has led to a fall 2 weeks ago. He now has pain over lower right ribcage.    PLAN: -CT Scan reviewed, NED  -I wrote a letter for his work, and will mail it to him  -Port flush in 6 weeks  -Lab, flush and f/u in 3 months, plan to repeat next CT at the end of year    No problem-specific Assessment & Plan notes found for this encounter.   No orders of the defined types were placed in this encounter.  I discussed the assessment and treatment plan with the patient. The patient was provided an opportunity to ask questions and all were answered. The patient agreed with the plan and demonstrated an understanding of the instructions.  The patient was advised to call back or seek an in-person evaluation if the symptoms worsen or if the condition fails to improve as anticipated.  I provided 25 minutes of face-to-face video visit time during this encounter, and > 50% was spent counseling as documented under my assessment & plan.    Truitt Merle, MD 03/22/2019   I, Joslyn Devon, am acting as scribe for Truitt Merle, MD.   I have reviewed the above documentation for accuracy and completeness, and I agree with the above.

## 2019-03-22 ENCOUNTER — Inpatient Hospital Stay: Payer: Commercial Managed Care - PPO | Attending: Hematology | Admitting: Hematology

## 2019-03-22 ENCOUNTER — Encounter: Payer: Self-pay | Admitting: Hematology

## 2019-03-22 ENCOUNTER — Telehealth: Payer: Self-pay | Admitting: Hematology

## 2019-03-22 DIAGNOSIS — Z79899 Other long term (current) drug therapy: Secondary | ICD-10-CM

## 2019-03-22 DIAGNOSIS — L271 Localized skin eruption due to drugs and medicaments taken internally: Secondary | ICD-10-CM

## 2019-03-22 DIAGNOSIS — I10 Essential (primary) hypertension: Secondary | ICD-10-CM

## 2019-03-22 DIAGNOSIS — C187 Malignant neoplasm of sigmoid colon: Secondary | ICD-10-CM | POA: Diagnosis not present

## 2019-03-22 DIAGNOSIS — Z791 Long term (current) use of non-steroidal anti-inflammatories (NSAID): Secondary | ICD-10-CM | POA: Diagnosis not present

## 2019-03-22 NOTE — Telephone Encounter (Signed)
Scheduled appt per 5/1 los ° °A calendar will be mailed out. °

## 2019-04-03 ENCOUNTER — Encounter: Payer: Self-pay | Admitting: Hematology

## 2019-04-03 ENCOUNTER — Telehealth: Payer: Self-pay

## 2019-04-03 NOTE — Telephone Encounter (Signed)
Patient left voice message wanting Dr. Ernestina Penna advice on whether he should go back to work on 5/18.  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  He is around a lot of people at work.  He does have the option of working remotely.    [phone]   ___________________________________ Message placed on Dr. Ernestina Penna desk for review.

## 2019-04-03 NOTE — Telephone Encounter (Signed)
Mailed letter to patient's home address.

## 2019-04-29 ENCOUNTER — Other Ambulatory Visit: Payer: Self-pay | Admitting: Hematology

## 2019-04-29 ENCOUNTER — Encounter: Payer: Self-pay | Admitting: Hematology

## 2019-04-29 DIAGNOSIS — E7800 Pure hypercholesterolemia, unspecified: Secondary | ICD-10-CM

## 2019-04-29 DIAGNOSIS — Z1329 Encounter for screening for other suspected endocrine disorder: Secondary | ICD-10-CM

## 2019-05-03 ENCOUNTER — Other Ambulatory Visit: Payer: Self-pay

## 2019-05-03 ENCOUNTER — Inpatient Hospital Stay: Payer: Commercial Managed Care - PPO | Attending: Hematology

## 2019-05-03 DIAGNOSIS — C187 Malignant neoplasm of sigmoid colon: Secondary | ICD-10-CM

## 2019-05-03 DIAGNOSIS — C19 Malignant neoplasm of rectosigmoid junction: Secondary | ICD-10-CM | POA: Insufficient documentation

## 2019-05-03 DIAGNOSIS — Z95828 Presence of other vascular implants and grafts: Secondary | ICD-10-CM

## 2019-05-03 MED ORDER — SODIUM CHLORIDE 0.9% FLUSH
10.0000 mL | Freq: Once | INTRAVENOUS | Status: AC
  Administered 2019-05-03: 10:00:00 10 mL
  Filled 2019-05-03: qty 10

## 2019-05-03 MED ORDER — HEPARIN SOD (PORK) LOCK FLUSH 100 UNIT/ML IV SOLN
500.0000 [IU] | Freq: Once | INTRAVENOUS | Status: AC
  Administered 2019-05-03: 10:00:00 500 [IU]
  Filled 2019-05-03: qty 5

## 2019-05-03 NOTE — Patient Instructions (Signed)

## 2019-06-07 ENCOUNTER — Encounter: Payer: Self-pay | Admitting: Gastroenterology

## 2019-06-07 ENCOUNTER — Telehealth: Payer: Self-pay

## 2019-06-07 ENCOUNTER — Other Ambulatory Visit: Payer: Self-pay

## 2019-06-07 ENCOUNTER — Ambulatory Visit: Payer: Commercial Managed Care - PPO | Admitting: Gastroenterology

## 2019-06-07 VITALS — BP 135/88 | HR 69 | Temp 96.8°F | Ht 75.0 in | Wt 223.2 lb

## 2019-06-07 DIAGNOSIS — C187 Malignant neoplasm of sigmoid colon: Secondary | ICD-10-CM

## 2019-06-07 NOTE — Assessment & Plan Note (Signed)
Pleasant 38 year old gentleman with history of stage IIIb adenocarcinoma of the rectosigmoid junction diagnosed August 2019, status post low anterior resection September 2019, completed adjuvant chemotherapy April 2020.  He presents for 1 year surveillance colonoscopy.  Overall doing well from a GI standpoint.  He does note some change in stool size/caliber since his surgery.  He has some tenderness located along the right side of his low anterior resection scar without obvious hernia.  We discussed that this can be reevaluated at time of his upcoming CT and that he should notify Dr. Burr Medico so she can include it in the clinical notes.  We will plan on updating colonoscopy in August or September, plan for deep sedation given previous failed conscious sedation.  I have discussed the risks, alternatives, benefits with regards to but not limited to the risk of reaction to medication, bleeding, infection, perforation and the patient is agreeable to proceed. Written consent to be obtained.

## 2019-06-07 NOTE — Progress Notes (Signed)
Primary Care Physician:  Chesley Noon, MD  Primary Gastroenterologist:  Garfield Cornea, MD  Chief Complaint  Patient presents with  . colorectal cancer    soreness right side of colectomy scar    HPI:  Jacob Hayes is a 38 y.o. male with history of stage IIIb (pT3, pN1a, cM0) adenocarcinoma of the rectosigmoid junction diagnosed at time of colonoscopy August 2019.  Underwent low anterior resection September 2019.  He had clear margins.  Due to the location of his tumor, radiation was not recommended.  Chemotherapy October 2019 through April 2020.  Received CAPOX/Xeloda but due to poor intolerance to Oxaliplatin (peripheral neuropathy), it was D/C'd after second cycle. He developed hand-foot syndrome on Xeloda. CT imaging March 20, 2019 with interval postoperative findings of rectal resection, no definitive evidence of metastatic disease in the chest, abdomen, pelvis.  No change in small prominent retroperitoneal lymph node, attention on follow-up.  Subtle subcapsular hyperdensity of the dome of the liver, likely focal fatty sparing.  Attention on follow-up.  Patient's last visit, via telemetry visit, with his oncologist was Mar 22, 2019.  Clinically he is doing fairly well.  Bowel function is different postoperatively.  Initially had more loose stools especially while on chemo.  After completion of chemo his stools started to solidify.  He has 4-5 "Fun size Twix" BMs daily.  No notable blood in the stool.  No melena.  He has had some discomfort towards the right side of his low anterior resection incision.  Feels sharp, stabbing pain, somewhat fleeting, comes and goes.  Concerned he may developed a hernia.  Has not seen an obvious bulge.  Possibly aggravated when he started trying to do abdominal crunches.  No upper GI symptoms on pantoprazole.  Becoming more active.  Weight is stable over the last 4 months.   Current Outpatient Medications  Medication Sig Dispense Refill  . acetaminophen  (TYLENOL) 500 MG tablet Take 500 mg by mouth every 6 (six) hours as needed for moderate pain or headache.     . Ascorbic Acid (VITAMIN C) 1000 MG tablet Take 1,000 mg by mouth daily.    Marland Kitchen b complex vitamins capsule Take 1 capsule by mouth daily.    . cetirizine (ZYRTEC) 10 MG tablet Take 10 mg by mouth daily as needed for allergies.     . diazepam (VALIUM) 5 MG tablet Take 2.5 mg by mouth at bedtime.   1  . diphenhydrAMINE (BENADRYL) 25 mg capsule Take 25 mg by mouth daily as needed for allergies.     . fluticasone (FLONASE) 50 MCG/ACT nasal spray Place 2 sprays into the nose daily. (Patient taking differently: Place 2 sprays into the nose daily as needed (for seasonal allergies.). ) 16 g 11  . ibuprofen (ADVIL,MOTRIN) 400 MG tablet Take 1-2 tablets (400-800 mg total) by mouth every 6 (six) hours as needed for fever, headache, mild pain or moderate pain. 30 tablet 0  . lidocaine-prilocaine (EMLA) cream Apply small amount over port site and cover with plastic wrap one hour prior to appointment. 30 g 3  . LORazepam (ATIVAN) 1 MG tablet Take 1 tablet (1 mg total) by mouth every 8 (eight) hours as needed for anxiety. 30 tablet 0  . losartan-hydrochlorothiazide (HYZAAR) 100-12.5 MG tablet Take 1 tablet by mouth daily.    . Magnesium 250 MG TABS Take 250 mg by mouth 2 (two) times daily.     . methimazole (TAPAZOLE) 5 MG tablet Take 5 mg by mouth 2 (  two) times daily.     . Multiple Vitamin (MULTIVITAMIN WITH MINERALS) TABS tablet Take 1 tablet by mouth daily. Men's One-A-Day    . ondansetron (ZOFRAN) 8 MG tablet Take 1 tablet (8 mg total) by mouth every 8 (eight) hours as needed for nausea or vomiting. 30 tablet 1  . pantoprazole (PROTONIX) 40 MG tablet TAKE ONE TABLET BY MOUTH TWICE DAILY BEFORE A MEAL (Patient taking differently: Take 40 mg by mouth 2 (two) times daily. ) 60 tablet 0  . prochlorperazine (COMPAZINE) 10 MG tablet Take 1 tablet (10 mg total) by mouth every 6 (six) hours as needed (Nausea or  vomiting). 30 tablet 1  . simvastatin (ZOCOR) 40 MG tablet Take 40 mg by mouth every evening.      No current facility-administered medications for this visit.     Allergies as of 06/07/2019  . (No Known Allergies)    Past Medical History:  Diagnosis Date  . Allergic rhinitis   . Anxiety   . Anxiety and depression    DENIES   . Bruxism   . Colon cancer ([place])   . Essential hypertension, benign   . Family history of breast cancer   . Family history of melanoma   . GERD   . Hyperlipidemia    Since age 72  . Meniere's disease     Past Surgical History:  Procedure Laterality Date  . BIOPSY N/A 03/20/2013   Procedure: BIOPSY;  Surgeon: [name], MD;  Location: [facility];  Service: Endoscopy;  Laterality: N/A;  Possible small bowel biopsy  . BIOPSY  07/05/2018   Procedure: BIOPSY;  Surgeon: [name], MD;  Location: [facility];  Service: Endoscopy;;  rectal mass  . COLONOSCOPY WITH PROPOFOL N/A 07/05/2018   Procedure: COLONOSCOPY WITH PROPOFOL;  Surgeon: [name], MD;  Location: [facility];  Service: Endoscopy;  Laterality: N/A;  8:30am  . ESOPHAGOGASTRODUODENOSCOPY N/A 03/20/2013   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  One slightly nodular.  Small hiatal hernia.  Multiple fundic gland appearing polyps. no evidence of celiac disease, malignancy, h.pylori, barrett's.   . None    . POLYPECTOMY  07/05/2018   Procedure: POLYPECTOMY;  Surgeon: [name], MD;  Location: [facility];  Service: Endoscopy;;  hepatic flexurex2;  . PORTACATH PLACEMENT N/A 08/23/2018   Procedure: INSERTION PORT-A-CATH;  Surgeon: [name], MD;  Location: [facility];  Service: General;  Laterality: N/A;  LMA  . XI ROBOTIC ASSISTED LOWER ANTERIOR RESECTION      Family History  Problem Relation Age of Onset  . Breast cancer Mother 61  . Thyroid disease Mother         Thyroid removed  . Lung cancer Mother   . Crohn's disease Mother        ???  . Heart disease Father   . Stroke Father 47  . Hyperlipidemia Father   . AAA (abdominal aortic aneurysm) Father   . Valvular heart disease Half-Brother 34       Bicuspid aortic valve  . Lung cancer Maternal Aunt   . Heart disease Paternal Grandfather 21       d. 32  . Other Half-Sister        lump in her breastw  . Melanoma Nephew        pat 1/2 sisters' son dx 5x  . Colon cancer Neg Hx  Social History   Socioeconomic History  . Marital status: Divorced    Spouse name: Not on file  . Number of children: 2  . Years of education: Not on file  . Highest education level: Not on file  Occupational History  . Occupation: works with Sales executive at [employer]  . Financial resource strain: Not on file  . Food insecurity    Worry: Not on file    Inability: Not on file  . Transportation needs    Medical: Not on file    Non-medical: Not on file  Tobacco Use  . Smoking status: Never Smoker  . Smokeless tobacco: Never Used  Substance and Sexual Activity  . Alcohol use: No  . Drug use: No  . Sexual activity: Yes  Lifestyle  . Physical activity    Days per week: Not on file    Minutes per session: Not on file  . Stress: Not on file  Relationships  . Social Herbalist on phone: Not on file    Gets together: Not on file    Attends religious service: Not on file    Active member of club or organization: Not on file    Attends meetings of clubs or organizations: Not on file    Relationship status: Not on file  . Intimate partner violence    Fear of current or ex partner: Not on file    Emotionally abused: Not on file    Physically abused: Not on file    Forced sexual activity: Not on file  Other Topics Concern  . Not on file  Social History Narrative   Unable to ask intimate partner questions.      ROS:  General: Negative for anorexia, weight loss, fever,  chills, fatigue, weakness. Eyes: Negative for vision changes.  ENT: Negative for hoarseness, difficulty swallowing , nasal congestion. CV: Negative for chest pain, angina, palpitations, dyspnea on exertion, peripheral edema.  Respiratory: Negative for dyspnea at rest, dyspnea on exertion, cough, sputum, wheezing.  GI: See history of present illness. GU:  Negative for dysuria, hematuria, urinary incontinence, urinary frequency, nocturnal urination.  MS: Negative for joint pain, low back pain.  Derm: Negative for rash or itching.  Neuro: Negative for weakness,  seizure, frequent headaches, memory loss, confusion.  Psych: Negative for anxiety, depression, suicidal ideation, hallucinations.  Endo: Negative for unusual weight change.  Heme: Negative for bruising or bleeding. Allergy: Negative for rash or hives.    Physical Examination:  BP 135/88   Pulse 69   Temp (!) 96.8 F (36 C) (Oral)   Ht 6\' 3"  (1.905 m)   Wt 223 lb 3.2 oz (101.2 kg)   BMI 27.90 kg/m    General: Well-nourished, well-developed in no acute distress.  Head: Normocephalic, atraumatic.   Eyes: Conjunctiva pink, no icterus. Mouth: Oropharyngeal mucosa moist and pink , no lesions erythema or exudate. Neck: Supple without thyromegaly, masses, or lymphadenopathy.  Lungs: Clear to auscultation bilaterally.  Heart: Regular rate and rhythm, no murmurs rubs or gallops.  Abdomen: Bowel sounds are normal, nontender, nondistended, no hepatosplenomegaly or masses, no abdominal bruits or    hernia , no rebound or guarding.  Point tenderness along low anterior resection scar right border without obvious abdominal wall defect  Rectal: Not performed Extremities: No lower extremity edema. No clubbing or deformities.  Neuro: Alert and oriented x 4 , grossly normal neurologically.  Skin: Warm and dry, no rash or jaundice.  Psych: Alert and cooperative, normal mood and affect.  Labs: Lab Results  Component Value Date    CREATININE 0.99 03/20/2019   BUN 12 03/20/2019   NA 140 03/20/2019   K 3.6 03/20/2019   CL 106 03/20/2019   CO2 25 03/20/2019   Lab Results  Component Value Date   ALT 27 03/20/2019   AST 25 03/20/2019   ALKPHOS 85 03/20/2019   BILITOT 0.6 03/20/2019   Lab Results  Component Value Date   WBC 3.6 (L) 03/20/2019   HGB 13.8 03/20/2019   HCT 39.2 03/20/2019   MCV 101.3 (H) 03/20/2019   PLT 206 03/20/2019       Imaging Studies: No results found.

## 2019-06-07 NOTE — Patient Instructions (Addendum)
1. Colonoscopy as scheduled. See separate instructions.  2. Speak with Dr. Burr Medico regarding your pain at incision site to make sure clinical info included at time of your next CT scans.

## 2019-06-07 NOTE — Telephone Encounter (Signed)
TCS w/Propofol w/RMR scheduled during OV for 08/15/19 at 2:15pm. Pt was given Clenpiq coupon. Pre-op appt 08/13/19 at 12:45pm. Appt letter mailed with procedure instructions.

## 2019-06-10 NOTE — Progress Notes (Signed)
cc'ed to pcp °

## 2019-06-12 ENCOUNTER — Other Ambulatory Visit: Payer: Self-pay

## 2019-06-12 ENCOUNTER — Telehealth: Payer: Self-pay

## 2019-06-12 MED ORDER — CLENPIQ 10-3.5-12 MG-GM -GM/160ML PO SOLN: 1.0000 | Freq: Once | ORAL | 0 refills | Status: AC

## 2019-06-12 NOTE — Telephone Encounter (Signed)
Called pt, TCS w/Propofol w/RMR moved up to 07/25/19 at 10:45am. LMOVM for endo scheduler.

## 2019-06-12 NOTE — Telephone Encounter (Signed)
Pre-op appt 07/23/19 at 1:45pm. Letter mailed with new procedure instructions.

## 2019-06-13 ENCOUNTER — Telehealth: Payer: Self-pay

## 2019-06-13 NOTE — Telephone Encounter (Addendum)
Called [facility], spoke to [employer]. PA submitted for TCS. Approved. PA# [id number]. Valid 06/13/19-08/21/19.

## 2019-06-17 NOTE — Progress Notes (Signed)
[place]   [phone] [phone]   Clinic Follow up Note   Patient Care Team: Chesley Noon, MD as PCP - General (Family Medicine) [name], MD (Cardiology) [name], MD as Attending Physician (Gastroenterology)  Date of Service:  06/21/2019  CHIEF COMPLAINT:  f/u colon cancer  SUMMARY OF ONCOLOGIC HISTORY: Oncology History Overview Note  Cancer Staging Cancer of sigmoid colon [facility]) Staging form: Colon and Rectum, AJCC 8th Edition - Pathologic stage from 08/07/2018: Stage IIIB (pT3, pN1a, cM0) - Signed by Truitt Merle, MD on 10/25/2018     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 07/05/18  IMPRESSION - Tumor in the rectum. Biopsied. - Diverticulosis in the entire examined colon. - Two 5 to 6 mm polyps, removed with a cold snare. Resected and retrieved. - The examination was otherwise normal on direct and retroflexion views.    07/05/2018 Initial Biopsy   Diagnosis 07/05/18 1. Colon, polyp(s), hepatic flexure - TUBULAR ADENOMA (ONE). - NO HIGH GRADE DYSPLASIA OR MALIGNANCY. 2. Rectum, biopsy, mass - INVASIVE MODERATELY DIFFERENTIATED COLORECTAL ADENOCARCINOMA.   07/10/2018 Imaging   CT CAP W Constrast 07/10/18  IMPRESSION: 1. Mass within the rectosigmoid colon is identified compatible with findings from recent colonoscopy. 2. No evidence for nodal metastasis or distant metastatic disease. 3. Left kidney cyst.   07/17/2018 Imaging   MRI PElvis 07/17/18  IMPRESSION: 4.2 cm polypoid lesion along the right lateral aspect of the upper rectum, corresponding to the patient's biopsy-proven rectal adenocarcinoma, as above. Rectal adenocarcinoma T stage:  T1 or T2 Rectal adenocarcinoma N stage:  N0 Distance from tumor to the anal sphincter is 14  cm.   08/07/2018 Initial Diagnosis   Rectosigmoid cancer ([place])   08/07/2018 Surgery   XI ROBOTIC LOW ANTERIOR RESECTION by Dr. Kieth Brightly and Dr. Marcello Moores    08/07/2018 Pathologic Stage   Diagnosis 08/07/18 1. Colon, segmental resection for tumor, rectosigmoid - INVASIVE MODERATELY DIFFERENTIATED COLORECTAL ADENOCARCINOMA, 3.5 CM. - CARCINOMA FOCALLY INVOLVES PERICOLONIC CONNECTIVE TISSUE. - METASTATIC CARCINOMA IN ONE OF FIFTEEN LYMPH NODES (1/15). - MARGINS NOT INVOLVED. 2. Colon, resection margin (donut), distal ring, sigmoid - BENIGN COLON. - NO EVIDENCE OF MALIGNANCY.   08/07/2018 Cancer Staging   Staging form: Colon and Rectum, AJCC 8th Edition - Pathologic stage from 08/07/2018: Stage IIIB (pT3, pN1a, cM0) - Signed by Truitt Merle, MD on 10/25/2018   08/29/2018 - 02/26/2019 Chemotherapy   Adjuvant CAPOX every 3 weeks with Xeloda 2063m twice daily 2 weeks on 1 week off starting 08/29/18. D/c Oxaliplatin after 09/28/18 (cycle 2) due to very poor toleration. Increased to 20029min AM and 250016mn PM starting with cycle 5.  Last 8th cycle dose was taken 02/26/19.    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  The Multi-Gene Panel offered by [facility] includes sequencing and/or deletion duplication testing of the following 84 genes: AIP, ALK, APC, ATM, AXIN2,BAP1,  BARD1, BLM, BMPR1A, BRCA1, BRCA2, BRIP1, CASR, CDC73, CDH1, CDK4, CDKN1B, CDKN1C, CDKN2A (p14ARF), CDKN2A (p16INK4a), CEBPA, CHEK2, CTNNA1, DICER1, DIS3L2, EGFR (c.2369C>T, p.Thr790Met variant only), EPCAM (Deletion/duplication testing only), FH, FLCN, GATA2, GPC3, GREM1 (Promoter region deletion/duplication testing only), HOXB13 (c.251G>A, p.Gly84Glu), HRAS, KIT, MAX, MEN1, MET, MITF (c.952G>A, p.Glu318Lys variant only), MLH1, MSH2, MSH3, MSH6, MUTYH, NBN, NF1, NF2, NTHL1, PALB2, PDGFRA,  PHOX2B, PMS2, POLD1, POLE, POT1, PRKAR1A, PTCH1, PTEN, RAD50, RAD51C, RAD51D, RB1, RECQL4, RET, RUNX1,  SDHAF2, SDHA (sequence changes only), SDHB, SDHC, SDHD, SMAD4, SMARCA4, SMARCB1, SMARCE1, STK11, SUFU, TERC, TERT, TMEM127, TP53, TSC1, TSC2, VHL, WRN and WT1.  The report date is 11/22/2018.   03/20/2019 Imaging   CT CAP 03/20/19  IMPRESSION: 1.  Interval postoperative findings of rectal resection.  2. No definite evidence of metastatic disease in the chest, abdomen, or pelvis.  3. No change in small prominent retroperitoneal lymph nodes. Attention on follow-up.  4. There is a subtle subcapsular hyperdensity of the dome of the liver, likely focal fatty sparing (series 2, image 49). Attention on follow-up.      CURRENT THERAPY:  Surveillance   INTERVAL HISTORY:  NIGIL BRAMAN is here for a follow up colon cancer. He presents to the clinic alone. He notes he is still tired energy wise. He is anxious about colonoscopy for 07/25/19. He notes his big toes nails are bothering him. He notes a marble size protrusion to the right of the incision. GI thinks it is scar tissue. This causes him sharp shooting pain based on his position. It will ache a little afterward and this dissipate.  He notes still having back pain from fall earlier this year. He feels he is overall out of shape. He tries to walk daily but feel she gets winded easily.    REVIEW OF SYSTEMS:    Constitutional: Denies fevers, chills or abnormal weight loss (+) fatigue  Eyes: Denies blurriness of vision Ears, nose, mouth, throat, and face: Denies mucositis or sore throat Respiratory: Denies cough, dyspnea or wheezes Cardiovascular: Denies palpitation, chest discomfort or lower extremity swelling Gastrointestinal:  Denies nausea, heartburn or change in bowel habits (+) Occasional sharp abdominal pain to the right of incision MSK: (+) Back pain or tenderness Skin: Denies abnormal skin rashes (+) Big toe nail pain Lymphatics: Denies new lymphadenopathy or easy bruising Neurological:Denies numbness, tingling or new weaknesses  Behavioral/Psych: Mood is stable, no new changes  All other systems were reviewed with the patient and are negative.  MEDICAL HISTORY:  Past Medical History:  Diagnosis Date  . Allergic rhinitis   . Anxiety   . Anxiety and depression    DENIES   . Bruxism   . Colon cancer ([place])   . Essential hypertension, benign   . Family history of breast cancer   . Family history of melanoma   . GERD   . Hyperlipidemia    Since age 48  . Meniere's disease     SURGICAL HISTORY: Past Surgical History:  Procedure Laterality Date  . BIOPSY N/A 03/20/2013   Procedure: BIOPSY;  Surgeon: [name], MD;  [facility];  Service: Endoscopy;  Laterality: N/A;  Possible small bowel biopsy  . BIOPSY  07/05/2018   Procedure: BIOPSY;  Surgeon: [name], MD;  Location: [facility];  Service: Endoscopy;;  rectal mass  . COLONOSCOPY WITH PROPOFOL N/A 07/05/2018   Procedure: COLONOSCOPY WITH PROPOFOL;  Surgeon: [name], MD;  Location: [facility];  Service: Endoscopy;  Laterality: N/A;  8:30am  . ESOPHAGOGASTRODUODENOSCOPY N/A 03/20/2013   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  One slightly nodular.  Small hiatal hernia.  Multiple fundic gland appearing polyps. no evidence of celiac disease, malignancy, h.pylori, barrett's.   . None    . POLYPECTOMY  07/05/2018  Procedure: POLYPECTOMY;  Surgeon: [name], MD;  Location: [facility];  Service: Endoscopy;;  hepatic flexurex2;  . PORTACATH PLACEMENT N/A 08/23/2018   Procedure: INSERTION PORT-A-CATH;  Surgeon: [name], MD;  Location: [facility];  Service: General;  Laterality: N/A;  LMA  . XI ROBOTIC ASSISTED LOWER ANTERIOR RESECTION      I have reviewed the social history and family history with the patient and they are unchanged from previous note.  ALLERGIES:  has No Known Allergies.  MEDICATIONS:   Current Outpatient Medications  Medication Sig Dispense Refill  . acetaminophen (TYLENOL) 500 MG tablet Take 500 mg by mouth every 6 (six) hours as needed for moderate pain or headache.     . Ascorbic Acid (VITAMIN C) 1000 MG tablet Take 1,000 mg by mouth daily.    Marland Kitchen b complex vitamins capsule Take 1 capsule by mouth daily.    . cetirizine (ZYRTEC) 10 MG tablet Take 10 mg by mouth daily as needed for allergies.     . diazepam (VALIUM) 5 MG tablet Take 2.5 mg by mouth at bedtime.   1  . diphenhydrAMINE (BENADRYL) 25 mg capsule Take 25 mg by mouth daily as needed for allergies.     . fluticasone (FLONASE) 50 MCG/ACT nasal spray Place 2 sprays into the nose daily. (Patient taking differently: Place 2 sprays into the nose daily as needed (for seasonal allergies.). ) 16 g 11  . ibuprofen (ADVIL,MOTRIN) 400 MG tablet Take 1-2 tablets (400-800 mg total) by mouth every 6 (six) hours as needed for fever, headache, mild pain or moderate pain. 30 tablet 0  . lidocaine-prilocaine (EMLA) cream Apply small amount over port site and cover with plastic wrap one hour prior to appointment. 30 g 3  . LORazepam (ATIVAN) 1 MG tablet Take 1 tablet (1 mg total) by mouth every 8 (eight) hours as needed for anxiety. 30 tablet 0  . losartan-hydrochlorothiazide (HYZAAR) 100-12.5 MG tablet Take 1 tablet by mouth daily.    . Magnesium 250 MG TABS Take 250 mg by mouth 2 (two) times daily.     . methimazole (TAPAZOLE) 5 MG tablet Take 5 mg by mouth 2 (two) times daily.     . Multiple Vitamin (MULTIVITAMIN WITH MINERALS) TABS tablet Take 1 tablet by mouth daily. Men's One-A-Day    . ondansetron (ZOFRAN) 8 MG tablet Take 1 tablet (8 mg total) by mouth every 8 (eight) hours as needed for nausea or vomiting. 30 tablet 1  . pantoprazole (PROTONIX) 40 MG tablet TAKE ONE TABLET BY MOUTH TWICE DAILY BEFORE A MEAL (Patient taking differently: Take 40 mg by mouth 2 (two) times daily. ) 60 tablet 0  . prochlorperazine (COMPAZINE) 10 MG  tablet Take 1 tablet (10 mg total) by mouth every 6 (six) hours as needed (Nausea or vomiting). 30 tablet 1  . simvastatin (ZOCOR) 40 MG tablet Take 40 mg by mouth every evening.      No current facility-administered medications for this visit.     PHYSICAL EXAMINATION: ECOG PERFORMANCE STATUS: 0 - Asymptomatic  Vitals:   06/21/19 1032  BP: 131/88  Pulse: 65  Resp: 18  Temp: 98.5 F (36.9 C)  SpO2: 99%   Filed Weights   06/21/19 1032  Weight: 223 lb (101.2 kg)    GENERAL:alert, no distress and comfortable SKIN: skin color, texture, turgor are normal, no rashes or significant lesions (+) dead toe nail of big toes EYES: normal, Conjunctiva are pink and non-injected, sclera clear  NECK: supple, thyroid normal size, non-tender, without nodularity LYMPH:  no palpable lymphadenopathy in the cervical, axillary  LUNGS: clear to auscultation and percussion with normal breathing effort HEART: regular rate & rhythm and no murmurs and no lower extremity edema ABDOMEN:abdomen soft, non-tender and normal bowel sounds (+) LRQ tenderness (+) Surgical incision healed well with mild scar tissue  Musculoskeletal:no cyanosis of digits and no clubbing  NEURO: alert & oriented x 3 with fluent speech, no focal motor/sensory deficits  LABORATORY DATA:  I have reviewed the data as listed CBC Latest Ref Rng & Units 06/21/2019 03/20/2019 02/08/2019  WBC 4.0 - 10.5 K/uL 3.6(L) 3.6(L) 4.3  Hemoglobin 13.0 - 17.0 g/dL 15.4 13.8 14.0  Hematocrit 39.0 - 52.0 % 44.8 39.2 38.9(L)  Platelets 150 - 400 K/uL 224 206 214     CMP Latest Ref Rng & Units 06/21/2019 03/20/2019 02/08/2019  Glucose 70 - 99 mg/dL 91 90 95  BUN 6 - 20 mg/dL _0 Creatinine 0.61 - 1.24 mg/dL 0.98 0.99 1.11  Sodium 135 - 145 mmol/L 139 140 142  Potassium 3.5 - 5.1 mmol/L 4.0 3.6 3.6  Chloride 98 - 111 mmol/L 106 106 107  CO2 22 - 32 mmol/L _1 Calcium 8.9 - 10.3 mg/dL 9.1 8.3(L) 9.0  Total Protein 6.5 - 8.1 g/dL 7.2 7.1  7.2  Total Bilirubin 0.3 - 1.2 mg/dL 0.5 0.6 0.7  Alkaline Phos 38 - 126 U/L 79 85 76  AST 15 - 41 U/L _2 ALT 0 - 44 U/L _3 RADIOGRAPHIC STUDIES: I have personally reviewed the radiological images as listed and agreed with the findings in the report. No results found.   ASSESSMENT & PLAN:  BERLE FITZ is a 38 y.o. male with   1. Rectosigmoid Cancer,Stage IIIB(pT3N1M0), MMR normal -He was diagnosed in 06/2018.He is s/p anterior resectionwith clear margins.Due to the tumor location mainly in sigmoid colon, radiation was not recommended. -Due to the high risk of recurrence, he started adjuvant chemotherapy with CAPOX. However he toleratedOxaliplatinpoorly and was stopped after 2 cycles. He is toleratingXelodawell and completed 8 cycles (including CAPOX). -I discussed the risk of cancer recurrence in the future. I discussed the surveillance plan, which is a physical exam and lab test (including CBC, CMP and CEA) every 3 months for the first 2 years, then every 6-12 months, colonoscopy in 07/2019, and surveillance CT scan every 6-12 month for up to 5 year.  -He will consider port removal after next CT scan which is reasonable. Continue port flush every 6-8 weeks. He does have aching over his port today (06/21/19). He is fine to have PAC removed sooner if needed.  -He has been recovering from treatment well. He has occasional positional sharp pain near incision site, secondary to surgery. Labs reviewed, CBC and CMP WNL except WBC 3.6. CEA, TSH and lipid panel still pending. Physical exam unremarkable, he has mild scar tissue of RLQ.  -Continue surveillance. Next CT CAP in 3 months. Next colonoscopy on 07/25/19.  -F/u in 3 months    2. HTN -On Losartan and HCTZ -BP controlled  -Continue to f/u with Dr. Domenic Polite.   3. Genetic Testing Negative    4. Back pain  -He had a fall in early 2020 on the stairs and landed on his back.  -He still has tenderness or pain  from his back  -He overall feels his body is out of shape  from this and his cancer treatments.  -I recommend he gradually increase his exercise level and not to start with strenuous activity yet.    PLAN: -He is clinically doing well  -Will send labs to his PCP  -F/u in 3 months with lab and CT CAP a few days before    No problem-specific Assessment & Plan notes found for this encounter.   Orders Placed This Encounter  Procedures  . CT Abdomen Pelvis W Contrast    Standing Status:   Future    Standing Expiration Date:   06/21/2020    Order Specific Question:   If indicated for the ordered procedure, I authorize the administration of contrast media per Radiology protocol    Answer:   Yes    Order Specific Question:   Preferred imaging location?    Answer:   [facility]    Order Specific Question:   Is Oral Contrast requested for this exam?    Answer:   Yes, Per Radiology protocol    Order Specific Question:   Radiology Contrast Protocol - do NOT remove file path    Answer:   _0 [email or web address]  . CT Chest W Contrast    Standing Status:   Future    Standing Expiration Date:   06/21/2020    Order Specific Question:   If indicated for the ordered procedure, I authorize the administration of contrast media per Radiology protocol    Answer:   Yes    Order Specific Question:   Preferred imaging location?    Answer:   [facility]    Order Specific Question:   Radiology Contrast Protocol - do NOT remove file path    Answer:   _1 [email or web address]   All questions were answered. The patient knows to call the clinic with any problems, questions or concerns. No barriers to learning was detected. I spent 15 minutes counseling the patient face to face. The total time spent in the appointment was 20 minutes and more than 50% was on counseling and review of test results     Truitt Merle, MD 06/21/2019   I, Joslyn Devon, am  acting as scribe for Truitt Merle, MD.   I have reviewed the above documentation for accuracy and completeness, and I agree with the above.

## 2019-06-21 ENCOUNTER — Inpatient Hospital Stay: Payer: Commercial Managed Care - PPO | Attending: Hematology

## 2019-06-21 ENCOUNTER — Encounter: Payer: Self-pay | Admitting: Hematology

## 2019-06-21 ENCOUNTER — Inpatient Hospital Stay (HOSPITAL_BASED_OUTPATIENT_CLINIC_OR_DEPARTMENT_OTHER): Payer: Commercial Managed Care - PPO | Admitting: Hematology

## 2019-06-21 ENCOUNTER — Other Ambulatory Visit: Payer: Self-pay

## 2019-06-21 ENCOUNTER — Telehealth: Payer: Self-pay | Admitting: Hematology

## 2019-06-21 ENCOUNTER — Inpatient Hospital Stay: Payer: Commercial Managed Care - PPO

## 2019-06-21 VITALS — BP 131/88 | HR 65 | Temp 98.5°F | Resp 18 | Ht 75.0 in | Wt 223.0 lb

## 2019-06-21 DIAGNOSIS — Z79899 Other long term (current) drug therapy: Secondary | ICD-10-CM | POA: Insufficient documentation

## 2019-06-21 DIAGNOSIS — I10 Essential (primary) hypertension: Secondary | ICD-10-CM | POA: Insufficient documentation

## 2019-06-21 DIAGNOSIS — C19 Malignant neoplasm of rectosigmoid junction: Secondary | ICD-10-CM | POA: Diagnosis not present

## 2019-06-21 DIAGNOSIS — Z9181 History of falling: Secondary | ICD-10-CM

## 2019-06-21 DIAGNOSIS — Z1329 Encounter for screening for other suspected endocrine disorder: Secondary | ICD-10-CM

## 2019-06-21 DIAGNOSIS — E7800 Pure hypercholesterolemia, unspecified: Secondary | ICD-10-CM

## 2019-06-21 DIAGNOSIS — C187 Malignant neoplasm of sigmoid colon: Secondary | ICD-10-CM

## 2019-06-21 DIAGNOSIS — M545 Low back pain: Secondary | ICD-10-CM

## 2019-06-21 DIAGNOSIS — Z95828 Presence of other vascular implants and grafts: Secondary | ICD-10-CM

## 2019-06-21 LAB — CBC WITH DIFFERENTIAL (CANCER CENTER ONLY)
Abs Immature Granulocytes: 0.01 10*3/uL (ref 0.00–0.07)
Basophils Absolute: 0 10*3/uL (ref 0.0–0.1)
Basophils Relative: 1 %
Eosinophils Absolute: 0.1 10*3/uL (ref 0.0–0.5)
Eosinophils Relative: 2 %
HCT: 44.8 % (ref 39.0–52.0)
Hemoglobin: 15.4 g/dL (ref 13.0–17.0)
Immature Granulocytes: 0 %
Lymphocytes Relative: 28 %
Lymphs Abs: 1 10*3/uL (ref 0.7–4.0)
MCH: 30.8 pg (ref 26.0–34.0)
MCHC: 34.4 g/dL (ref 30.0–36.0)
MCV: 89.6 fL (ref 80.0–100.0)
Monocytes Absolute: 0.3 10*3/uL (ref 0.1–1.0)
Monocytes Relative: 9 %
Neutro Abs: 2.2 10*3/uL (ref 1.7–7.7)
Neutrophils Relative %: 60 %
Platelet Count: 224 10*3/uL (ref 150–400)
RBC: 5 MIL/uL (ref 4.22–5.81)
RDW: 11.5 % (ref 11.5–15.5)
WBC Count: 3.6 10*3/uL — ABNORMAL LOW (ref 4.0–10.5)
nRBC: 0 % (ref 0.0–0.2)

## 2019-06-21 LAB — CMP (CANCER CENTER ONLY)
ALT: 19 U/L (ref 0–44)
AST: 18 U/L (ref 15–41)
Albumin: 4.1 g/dL (ref 3.5–5.0)
Alkaline Phosphatase: 79 U/L (ref 38–126)
Anion gap: 7 (ref 5–15)
BUN: 13 mg/dL (ref 6–20)
CO2: 26 mmol/L (ref 22–32)
Calcium: 9.1 mg/dL (ref 8.9–10.3)
Chloride: 106 mmol/L (ref 98–111)
Creatinine: 0.98 mg/dL (ref 0.61–1.24)
GFR, Est AFR Am: >60 mL/min (ref >60)
GFR, Estimated: >60 mL/min (ref >60)
Glucose, Bld: 91 mg/dL (ref 70–99)
Potassium: 4 mmol/L (ref 3.5–5.1)
Sodium: 139 mmol/L (ref 135–145)
Total Bilirubin: 0.5 mg/dL (ref 0.3–1.2)
Total Protein: 7.2 g/dL (ref 6.5–8.1)

## 2019-06-21 LAB — CEA (IN HOUSE-CHCC): CEA (CHCC-In House): 3.13 ng/mL (ref 0.00–5.00)

## 2019-06-21 LAB — LIPID PANEL
Cholesterol: 196 mg/dL (ref 0–200)
HDL: 43 mg/dL (ref >40)
LDL Cholesterol: 121 mg/dL — ABNORMAL HIGH (ref 0–99)
Total CHOL/HDL Ratio: 4.6 RATIO
Triglycerides: 161 mg/dL — ABNORMAL HIGH (ref <150)
VLDL: 32 mg/dL (ref 0–40)

## 2019-06-21 LAB — TSH: TSH: 1.442 u[IU]/mL (ref 0.320–4.118)

## 2019-06-21 MED ORDER — HEPARIN SOD (PORK) LOCK FLUSH 100 UNIT/ML IV SOLN
500.0000 [IU] | Freq: Once | INTRAVENOUS | Status: AC
  Administered 2019-06-21: 10:00:00 500 [IU]
  Filled 2019-06-21: qty 5

## 2019-06-21 MED ORDER — SODIUM CHLORIDE 0.9% FLUSH
10.0000 mL | Freq: Once | INTRAVENOUS | Status: AC
  Administered 2019-06-21: 10 mL
  Filled 2019-06-21: qty 10

## 2019-06-21 NOTE — Telephone Encounter (Signed)
Gave avs, calendar, and contrast ° °

## 2019-06-22 ENCOUNTER — Encounter: Payer: Self-pay | Admitting: Hematology

## 2019-06-22 IMAGING — CT CT ABD-PELV W/ CM
2 of 3 series · 13 of 36 positions shown, 16 images · IV contrast (Isovue)
Comparison: None

CLINICAL DATA: Staging colorectal carcinoma

EXAM:
CT CHEST, ABDOMEN, AND PELVIS WITH CONTRAST
TECHNIQUE: Multidetector CT imaging of the chest, abdomen and pelvis was
performed following the standard protocol during bolus
administration of intravenous contrast.
CONTRAST:  100mL VHJJ4Q-0CC IOPAMIDOL (VHJJ4Q-0CC) INJECTION 61%

[Series 2: cap with · axial · 0.78mm/px · z∈[+911,+1536]mm · 10 of 147 slices shown, 13 images]
[im 11/147  mediastinal]
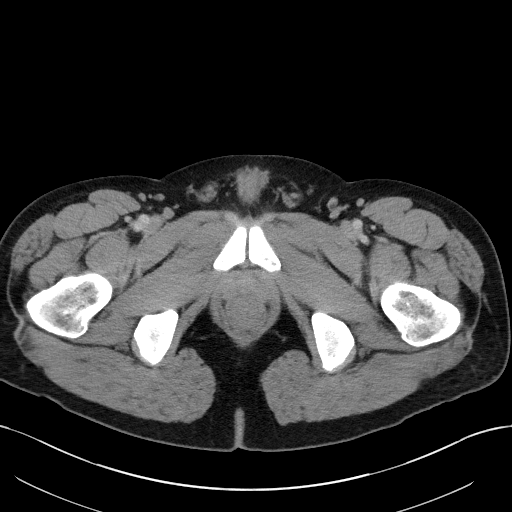
[im 11/147  lung]
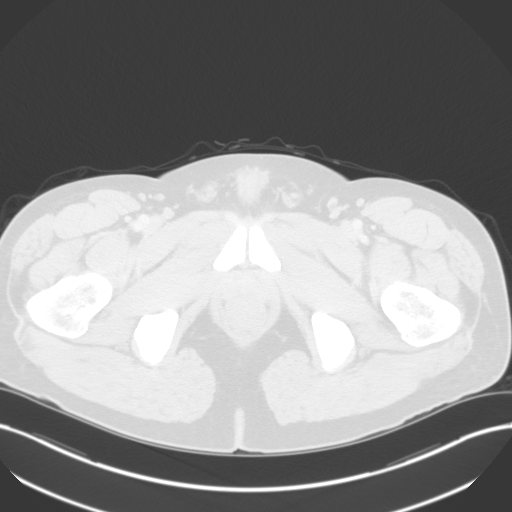
[im 22/147  lung]
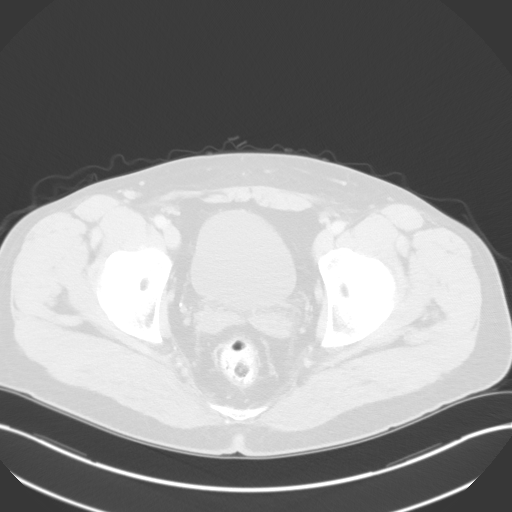
[im 38/147  lung]
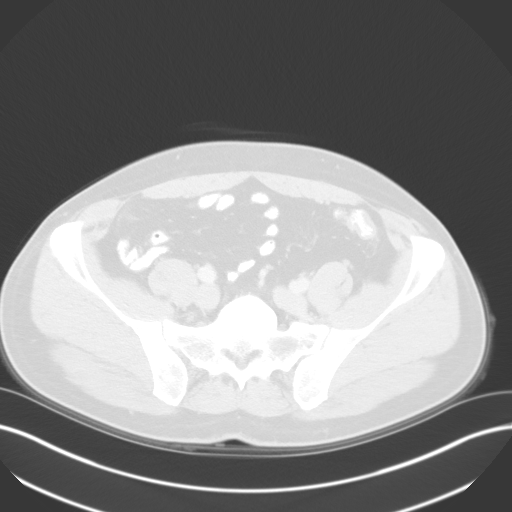
[im 55/147  lung]
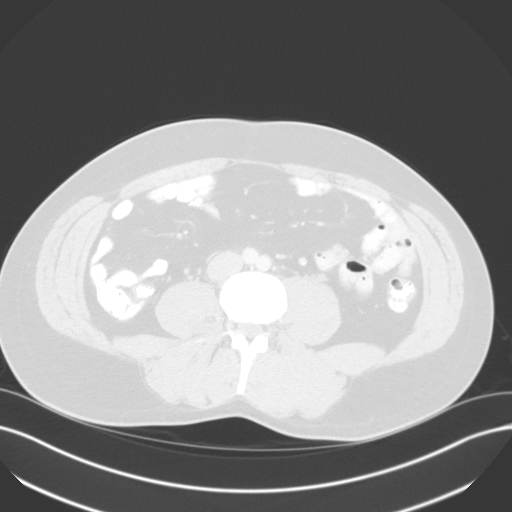
[im 65/147  mediastinal]
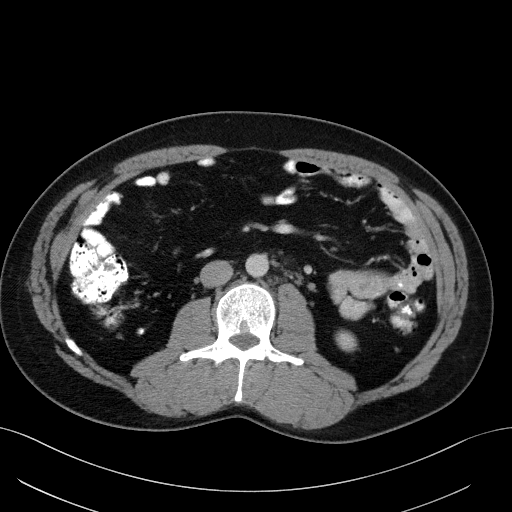
[im 65/147  lung]
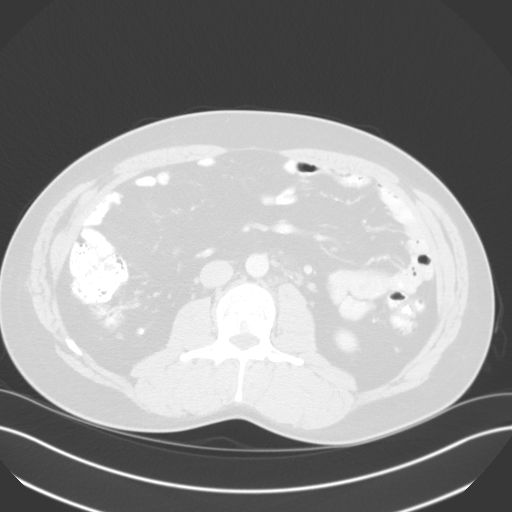
[im 82/147  lung]
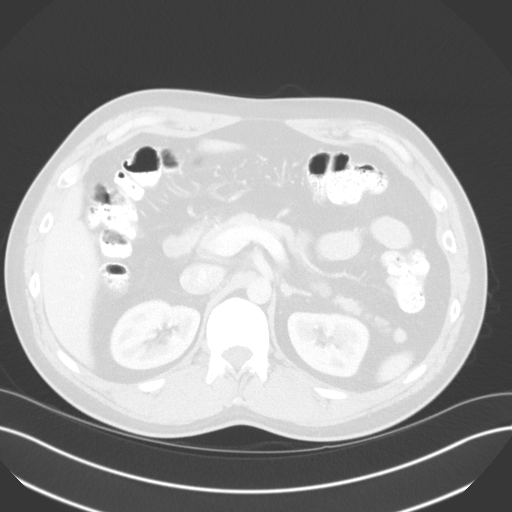
[im 92/147  lung]
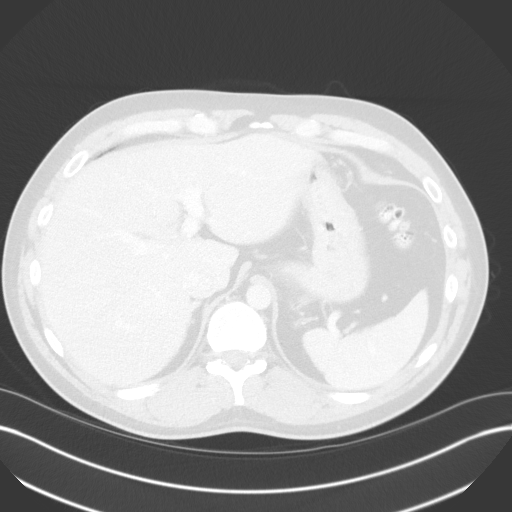
[im 109/147  lung]
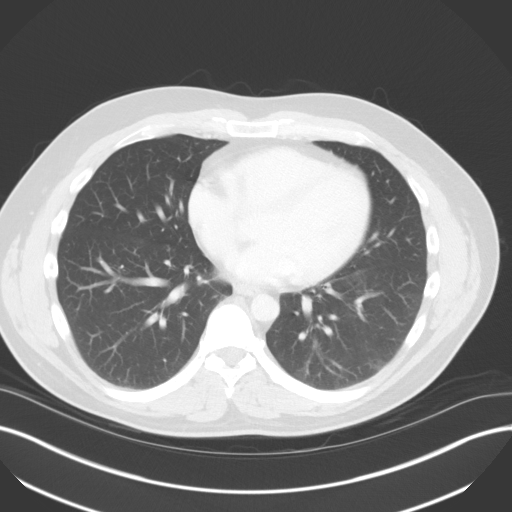
[im 125/147  mediastinal]
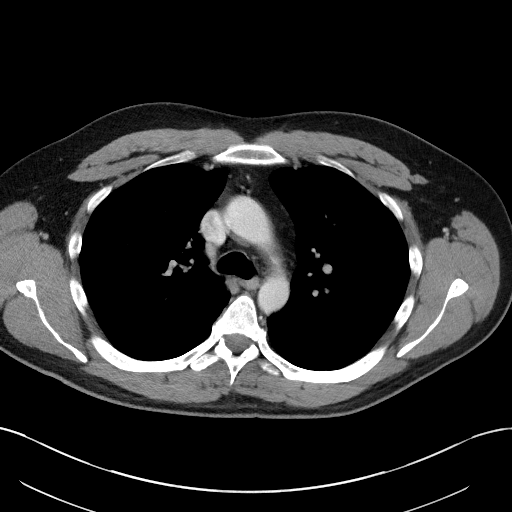
[im 125/147  lung]
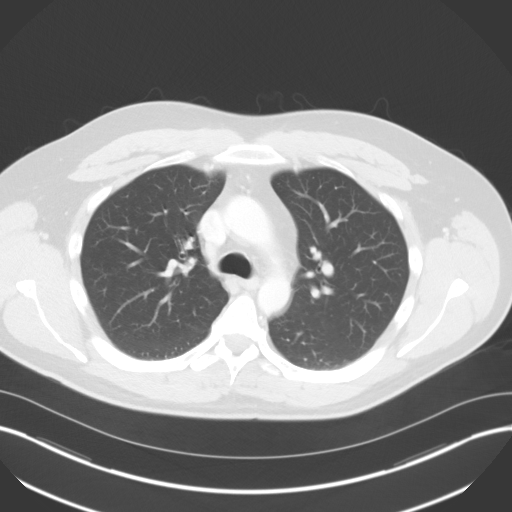
[im 136/147  lung]
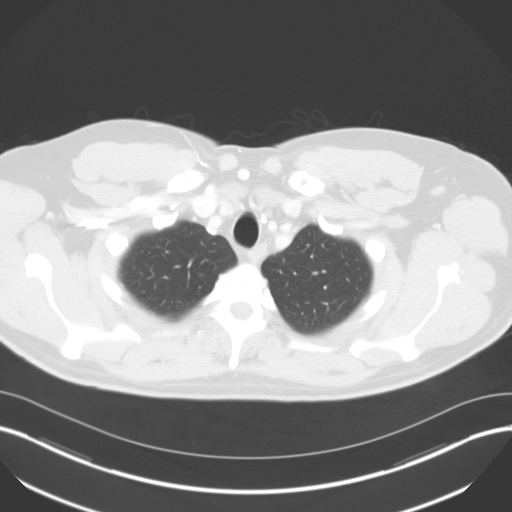

[Series 4: coronals · coronal · 0.88mm/px · 3 of 137 slices shown]
[im 28/137  lung]
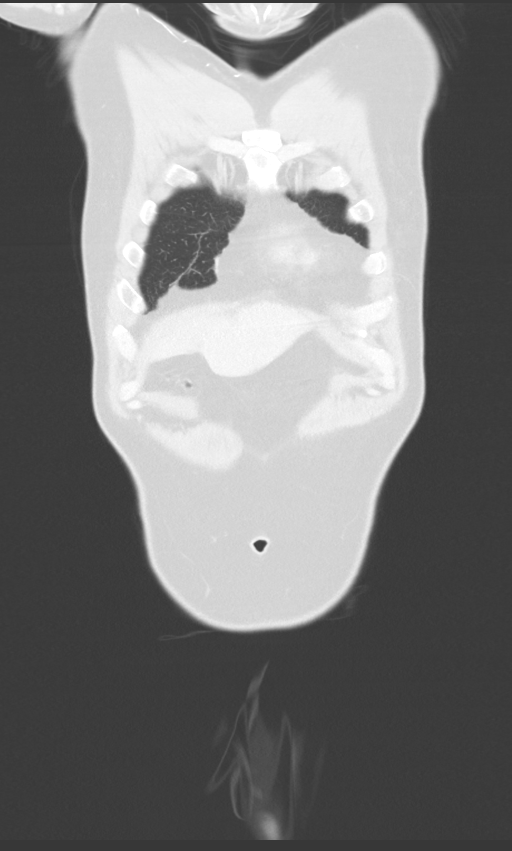
[im 55/137  lung]
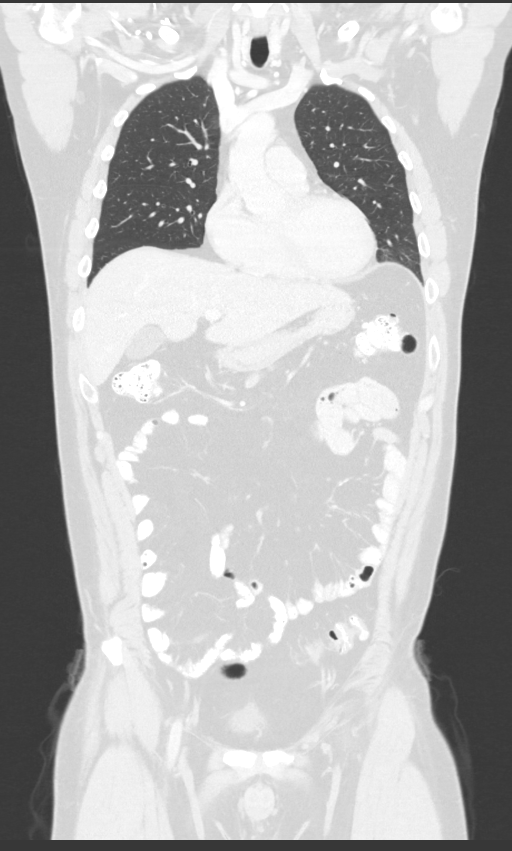
[im 82/137  lung]
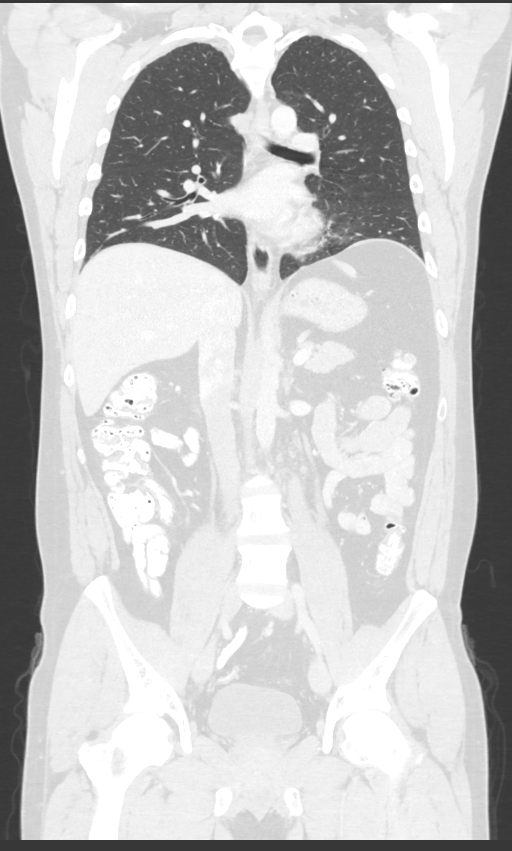

[13 of 36 positions shown; findings below may reference images not displayed]

FINDINGS: CT CHEST FINDINGS

Cardiovascular: No significant vascular findings. Normal heart size.
No pericardial effusion.

Mediastinum/Nodes: No enlarged mediastinal, hilar, or axillary lymph
nodes. Thyroid gland, trachea, and esophagus demonstrate no
significant findings.

Lungs/Pleura: No pleural effusion. No airspace consolidation,
atelectasis or pneumothorax. No suspicious pulmonary nodules or
masses.

Musculoskeletal: No aggressive lytic or sclerotic bone lesions
identified.

CT ABDOMEN PELVIS FINDINGS

Hepatobiliary: No focal liver abnormality is seen. No gallstones,
gallbladder wall thickening, or biliary dilatation.

Pancreas: Unremarkable. No pancreatic ductal dilatation or
surrounding inflammatory changes.

Spleen: Normal in size without focal abnormality.

Adrenals/Urinary Tract: The adrenal glands appear within normal
limits. Unremarkable appearance of the right kidney. Left kidney
cyst is noted measuring 1.2 cm. No mass or hydronephrosis
identified. Urinary bladder appears normal.

Stomach/Bowel: Normal appearance of the stomach. The small bowel
loops have a normal course and caliber. The appendix is visualized
and appears normal. No pathologic dilatation of the colon. The
sigmoid colon and rectum are decompressed. Mass involving the
rectosigmoid colon is identified measuring 3.7 cm in length with a
diameter of approximately 2.3 cm. Nonobstructing, image, image 121/2
and image 98/4.

Vascular/Lymphatic: Normal appearance of the abdominal aorta. No
enlarged lymph nodes within the upper abdomen. No pelvic or inguinal
adenopathy. No significant perirectal adenopathy.

Reproductive: Prostate is unremarkable.

Other: No ascites.  No peritoneal nodularity or mass.

Musculoskeletal: No aggressive appearing bone lesions.
IMPRESSION: 1. Mass within the rectosigmoid colon is identified compatible with
findings from recent colonoscopy.
2. No evidence for nodal metastasis or distant metastatic disease.
3. Left kidney cyst.

## 2019-06-26 ENCOUNTER — Telehealth: Payer: Self-pay

## 2019-06-26 NOTE — Telephone Encounter (Signed)
Left voice message for patient concerning lab results.  Per Dr. Burr Medico notified him CBC, CMP, TSH and CEA were all WNL, we will forward lipid panel results to his PCP.

## 2019-06-26 NOTE — Telephone Encounter (Signed)
Patient called with concern on CEA trending upward.  Per Dr. Burr Medico still in the normal range of no concern.  Reassured patient.

## 2019-06-26 NOTE — Telephone Encounter (Signed)
-----   Message from Truitt Merle, MD sent at 06/26/2019  8:05 AM EDT ----- Please let pt know his lab results, CBC, CMP, TSH and CEA were all WNL, please forward his Lipid panel results to his PCP, thanks   Truitt Merle  06/26/2019

## 2019-06-26 NOTE — Telephone Encounter (Signed)
Left voice message for this patient regarding lab results, per Dr. Burr Medico CBC, CMP, TSH, CEA all within normal limits Lipid panel results will be faxed to his PCP Dr. Clearence Cheek.    Lab results were faxed to Dr. Melford Aase at [phone]

## 2019-06-27 ENCOUNTER — Telehealth: Payer: Self-pay

## 2019-06-27 NOTE — Telephone Encounter (Signed)
Faxed lab results to Dr. Anastasia Pall for his review, sent to HIM for scan to chart.

## 2019-07-23 ENCOUNTER — Other Ambulatory Visit (HOSPITAL_COMMUNITY)
Admission: RE | Admit: 2019-07-23 | Discharge: 2019-07-23 | Disposition: A | Discharge status: Home / Self Care | Payer: Commercial Managed Care - PPO | Source: Ambulatory Visit | Attending: Internal Medicine | Admitting: Internal Medicine

## 2019-07-23 ENCOUNTER — Encounter (HOSPITAL_COMMUNITY)
Admission: RE | Admit: 2019-07-23 | Discharge: 2019-07-23 | Disposition: A | Discharge status: Home / Self Care | Payer: Commercial Managed Care - PPO | Source: Ambulatory Visit | Attending: Internal Medicine | Admitting: Internal Medicine

## 2019-07-23 ENCOUNTER — Other Ambulatory Visit: Payer: Self-pay

## 2019-07-23 DIAGNOSIS — Z20828 Contact with and (suspected) exposure to other viral communicable diseases: Secondary | ICD-10-CM | POA: Insufficient documentation

## 2019-07-23 DIAGNOSIS — Z01812 Encounter for preprocedural laboratory examination: Secondary | ICD-10-CM | POA: Insufficient documentation

## 2019-07-23 LAB — SARS CORONAVIRUS 2 (TAT 6-24 HRS): SARS Coronavirus 2: NEGATIVE

## 2019-07-24 ENCOUNTER — Telehealth: Payer: Self-pay

## 2019-07-24 NOTE — Telephone Encounter (Signed)
Pt called office, TCS for tomorrow moved up to 7:30am. Advised him to arrive at 6:15am. Start drinking 2nd half of prep tomorrow morning at 2:30am and NPO after 4:30am. Endo scheduler informed.

## 2019-07-24 NOTE — Telephone Encounter (Signed)
Called pt, asked him if he would like to move TCS up earlier tomorrow d/t a cancellation on schedule. He will call office back.

## 2019-07-25 ENCOUNTER — Other Ambulatory Visit: Payer: Self-pay

## 2019-07-25 ENCOUNTER — Ambulatory Visit (HOSPITAL_COMMUNITY)
Admission: RE | Admit: 2019-07-25 | Discharge: 2019-07-25 | Disposition: A | Discharge status: Home / Self Care | Payer: Commercial Managed Care - PPO | Attending: Internal Medicine | Admitting: Internal Medicine

## 2019-07-25 ENCOUNTER — Ambulatory Visit (HOSPITAL_COMMUNITY): Payer: Commercial Managed Care - PPO | Admitting: Anesthesiology

## 2019-07-25 ENCOUNTER — Encounter (HOSPITAL_COMMUNITY): Payer: Self-pay | Admitting: *Deleted

## 2019-07-25 ENCOUNTER — Encounter (HOSPITAL_COMMUNITY)
Admission: RE | Disposition: A | Discharge status: Home / Self Care | Payer: Self-pay | Source: Home / Self Care | Attending: Internal Medicine

## 2019-07-25 DIAGNOSIS — E785 Hyperlipidemia, unspecified: Secondary | ICD-10-CM | POA: Diagnosis not present

## 2019-07-25 DIAGNOSIS — Z980 Intestinal bypass and anastomosis status: Secondary | ICD-10-CM | POA: Diagnosis not present

## 2019-07-25 DIAGNOSIS — Z9221 Personal history of antineoplastic chemotherapy: Secondary | ICD-10-CM | POA: Insufficient documentation

## 2019-07-25 DIAGNOSIS — Z801 Family history of malignant neoplasm of trachea, bronchus and lung: Secondary | ICD-10-CM | POA: Insufficient documentation

## 2019-07-25 DIAGNOSIS — Z823 Family history of stroke: Secondary | ICD-10-CM | POA: Diagnosis not present

## 2019-07-25 DIAGNOSIS — I10 Essential (primary) hypertension: Secondary | ICD-10-CM | POA: Insufficient documentation

## 2019-07-25 DIAGNOSIS — K219 Gastro-esophageal reflux disease without esophagitis: Secondary | ICD-10-CM | POA: Insufficient documentation

## 2019-07-25 DIAGNOSIS — K5730 Diverticulosis of large intestine without perforation or abscess without bleeding: Secondary | ICD-10-CM | POA: Insufficient documentation

## 2019-07-25 DIAGNOSIS — Z808 Family history of malignant neoplasm of other organs or systems: Secondary | ICD-10-CM | POA: Diagnosis not present

## 2019-07-25 DIAGNOSIS — Z8249 Family history of ischemic heart disease and other diseases of the circulatory system: Secondary | ICD-10-CM | POA: Diagnosis not present

## 2019-07-25 DIAGNOSIS — Z79899 Other long term (current) drug therapy: Secondary | ICD-10-CM | POA: Diagnosis not present

## 2019-07-25 DIAGNOSIS — F419 Anxiety disorder, unspecified: Secondary | ICD-10-CM | POA: Insufficient documentation

## 2019-07-25 DIAGNOSIS — Z1211 Encounter for screening for malignant neoplasm of colon: Secondary | ICD-10-CM | POA: Diagnosis present

## 2019-07-25 DIAGNOSIS — Z86010 Personal history of colonic polyps: Secondary | ICD-10-CM | POA: Insufficient documentation

## 2019-07-25 DIAGNOSIS — Z85038 Personal history of other malignant neoplasm of large intestine: Secondary | ICD-10-CM | POA: Insufficient documentation

## 2019-07-25 DIAGNOSIS — H8109 Meniere's disease, unspecified ear: Secondary | ICD-10-CM | POA: Insufficient documentation

## 2019-07-25 DIAGNOSIS — F329 Major depressive disorder, single episode, unspecified: Secondary | ICD-10-CM | POA: Insufficient documentation

## 2019-07-25 DIAGNOSIS — Z803 Family history of malignant neoplasm of breast: Secondary | ICD-10-CM | POA: Insufficient documentation

## 2019-07-25 DIAGNOSIS — C187 Malignant neoplasm of sigmoid colon: Secondary | ICD-10-CM

## 2019-07-25 HISTORY — PX: COLONOSCOPY WITH PROPOFOL: SHX5780

## 2019-07-25 SURGERY — COLONOSCOPY WITH PROPOFOL
Anesthesia: General

## 2019-07-25 MED ORDER — KETAMINE HCL 50 MG/5ML IJ SOSY
PREFILLED_SYRINGE | INTRAMUSCULAR | Status: AC
  Filled 2019-07-25: qty 5

## 2019-07-25 MED ORDER — PROPOFOL 10 MG/ML IV BOLUS
INTRAVENOUS | Status: DC | PRN
  Administered 2019-07-25 (×2): 20 mg via INTRAVENOUS
  Administered 2019-07-25 (×2): 30 mg via INTRAVENOUS

## 2019-07-25 MED ORDER — CHLORHEXIDINE GLUCONATE CLOTH 2 % EX PADS: 6.0000 | MEDICATED_PAD | Freq: Once | CUTANEOUS | Status: DC

## 2019-07-25 MED ORDER — LACTATED RINGERS IV SOLN
INTRAVENOUS | Status: DC
  Administered 2019-07-25: 1000 mL via INTRAVENOUS

## 2019-07-25 MED ORDER — SODIUM CHLORIDE 0.9% FLUSH: 10.0000 mL | INTRAVENOUS | Status: DC | PRN

## 2019-07-25 MED ORDER — HYDROMORPHONE HCL 1 MG/ML IJ SOLN: 0.2500 mg | INTRAMUSCULAR | Status: DC | PRN

## 2019-07-25 MED ORDER — HEPARIN SOD (PORK) LOCK FLUSH 100 UNIT/ML IV SOLN
500.0000 [IU] | INTRAVENOUS | Status: DC | PRN
  Filled 2019-07-25: qty 5

## 2019-07-25 MED ORDER — PROPOFOL 500 MG/50ML IV EMUL
INTRAVENOUS | Status: DC | PRN
  Administered 2019-07-25: 175 ug/kg/min via INTRAVENOUS

## 2019-07-25 MED ORDER — PROPOFOL 10 MG/ML IV BOLUS
INTRAVENOUS | Status: AC
  Filled 2019-07-25: qty 60

## 2019-07-25 MED ORDER — MIDAZOLAM HCL 2 MG/2ML IJ SOLN: 0.5000 mg | Freq: Once | INTRAMUSCULAR | Status: DC | PRN

## 2019-07-25 MED ORDER — HYDROCODONE-ACETAMINOPHEN 7.5-325 MG PO TABS: 1.0000 | ORAL_TABLET | Freq: Once | ORAL | Status: DC | PRN

## 2019-07-25 MED ORDER — PROMETHAZINE HCL 25 MG/ML IJ SOLN: 6.2500 mg | INTRAMUSCULAR | Status: DC | PRN

## 2019-07-25 NOTE — Discharge Instructions (Signed)
Diverticulitis  Diverticulitis is when small pockets in your large intestine (colon) get infected or swollen. This causes stomach pain and watery poop (diarrhea). These pouches are called diverticula. They form in people who have a condition called diverticulosis. Follow these instructions at home: Medicines Take over-the-counter and prescription medicines only as told by your doctor. These include: Antibiotics. Pain medicines. Fiber pills. Probiotics. Stool softeners. Do not drive or use heavy machinery while taking prescription pain medicine. If you were prescribed an antibiotic, take it as told. Do not stop taking it even if you feel better. General instructions  Follow a diet as told by your doctor. When you feel better, your doctor may tell you to change your diet. You may need to eat a lot of fiber. Fiber makes it easier to poop (have bowel movements). Healthy foods with fiber include: Berries. Beans. Lentils. Green vegetables. Exercise 3 or more times a week. Aim for 30 minutes each time. Exercise enough to sweat and make your heart beat faster. Keep all follow-up visits as told. This is important. You may need to have an exam of the large intestine. This is called a colonoscopy. Contact a doctor if: Your pain does not get better. You have a hard time eating or drinking. You are not pooping like normal. Get help right away if: Your pain gets worse. Your problems do not get better. Your problems get worse very fast. You have a fever. You throw up (vomit) more than one time. You have poop that is: Bloody. Black. Tarry. Summary Diverticulitis is when small pockets in your large intestine (colon) get infected or swollen. Take medicines only as told by your doctor. Follow a diet as told by your doctor. This information is not intended to replace advice given to you by your health care provider. Make sure you discuss any questions you have with your health care  provider. Document Released: 04/25/2008 Document Revised: 10/20/2017 Document Reviewed: 11/24/2016 Elsevier Patient Education  2020 [employer].  Colonoscopy Discharge Instructions  Read the instructions outlined below and refer to this sheet in the next few weeks. These discharge instructions provide you with general information on caring for yourself after you leave the hospital. Your doctor may also give you specific instructions. While your treatment has been planned according to the most current medical practices available, unavoidable complications occasionally occur. If you have any problems or questions after discharge, call [name] at [phone]. ACTIVITY  You may resume your regular activity, but move at a slower pace for the next 24 hours.   Take frequent rest periods for the next 24 hours.   Walking will help get rid of the air and reduce the bloated feeling in your belly (abdomen).   No driving for 24 hours (because of the medicine (anesthesia) used during the test).    Do not sign any important legal documents or operate any machinery for 24 hours (because of the anesthesia used during the test).  NUTRITION  Drink plenty of fluids.   You may resume your normal diet as instructed by your doctor.   Begin with a light meal and progress to your normal diet. Heavy or fried foods are harder to digest and may make you feel sick to your stomach (nauseated).   Avoid alcoholic beverages for 24 hours or as instructed.  MEDICATIONS  You may resume your normal medications unless your doctor tells you otherwise.  WHAT YOU CAN EXPECT TODAY  Some feelings of bloating in the abdomen.  Passage of more gas than usual.   Spotting of blood in your stool or on the toilet paper.  IF YOU HAD POLYPS REMOVED DURING THE COLONOSCOPY:  No aspirin products for 7 days or as instructed.   No alcohol for 7 days or as instructed.   Eat a soft diet for the next 24 hours.  FINDING OUT THE  RESULTS OF YOUR TEST Not all test results are available during your visit. If your test results are not back during the visit, make an appointment with your caregiver to find out the results. Do not assume everything is normal if you have not heard from your caregiver or the medical facility. It is important for you to follow up on all of your test results.  SEEK IMMEDIATE MEDICAL ATTENTION IF:  You have more than a spotting of blood in your stool.   Your belly is swollen (abdominal distention).   You are nauseated or vomiting.   You have a temperature over 101.   You have abdominal pain or discomfort that is severe or gets worse throughout the day.   Diverticulosis information provided  No sign of cancer  Repeat colonoscopy in 3 years   Monitored Anesthesia Care, Care After These instructions provide you with information about caring for yourself after your procedure. Your health care provider may also give you more specific instructions. Your treatment has been planned according to current medical practices, but problems sometimes occur. Call your health care provider if you have any problems or questions after your procedure. What can I expect after the procedure? After your procedure, you may: Feel sleepy for several hours. Feel clumsy and have poor balance for several hours. Feel forgetful about what happened after the procedure. Have poor judgment for several hours. Feel nauseous or vomit. Have a sore throat if you had a breathing tube during the procedure. Follow these instructions at home: For at least 24 hours after the procedure:     Have a responsible adult stay with you. It is important to have someone help care for you until you are awake and alert. Rest as needed. Do not: Participate in activities in which you could fall or become injured. Drive. Use heavy machinery. Drink alcohol. Take sleeping pills or medicines that cause drowsiness. Make important  decisions or sign legal documents. Take care of children on your own. Eating and drinking Follow the diet that is recommended by your health care provider. If you vomit, drink water, juice, or soup when you can drink without vomiting. Make sure you have little or no nausea before eating solid foods. General instructions Take over-the-counter and prescription medicines only as told by your health care provider. If you have sleep apnea, surgery and certain medicines can increase your risk for breathing problems. Follow instructions from your health care provider about wearing your sleep device: Anytime you are sleeping, including during daytime naps. While taking prescription pain medicines, sleeping medicines, or medicines that make you drowsy. If you smoke, do not smoke without supervision. Keep all follow-up visits as told by your health care provider. This is important. Contact a health care provider if: You keep feeling nauseous or you keep vomiting. You feel light-headed. You develop a rash. You have a fever. Get help right away if: You have trouble breathing. Summary For several hours after your procedure, you may feel sleepy and have poor judgment. Have a responsible adult stay with you for at least 24 hours or until you are awake and alert.  This information is not intended to replace advice given to you by your health care provider. Make sure you discuss any questions you have with your health care provider. Document Released: 02/28/2016 Document Revised: 02/05/2018 Document Reviewed: 02/28/2016 Elsevier Patient Education  2020 [employer].   I called and left a message for Kirstie Mirza at [phone], at patient's request.

## 2019-07-25 NOTE — Transfer of Care (Signed)
Immediate Anesthesia Transfer of Care Note  Patient: Jacob Hayes  Procedure(s) Performed: COLONOSCOPY WITH PROPOFOL (N/A )  Patient Location: PACU  Anesthesia Type:MAC  Level of Consciousness: awake, alert  and oriented  Airway & Oxygen Therapy: Patient Spontanous Breathing and Patient connected to nasal cannula oxygen  Post-op Assessment: Report given to RN and Post -op Vital signs reviewed and stable  Post vital signs: Reviewed and stable  Last Vitals:  Vitals Value Taken Time  BP    Temp    Pulse    Resp    SpO2      Last Pain:  Vitals:   07/25/19 0733  TempSrc:   PainSc: 0-No pain      Patients Stated Pain Goal: 7 (55/73/22 0254)  Complications: No apparent anesthesia complications

## 2019-07-25 NOTE — H&P (Signed)
@LOGO @   Primary Care Physician:  Chesley Noon, MD Primary Gastroenterologist:  [name]  Pre-Procedure History & Physical: HPI:  Jacob Hayes is a 38 y.o. 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  Took partial course of chemotherapy.  Clinically, doing well from a GI standpoint.  Past Medical History:  Diagnosis Date  . Allergic rhinitis   . Anxiety   . Anxiety and depression    DENIES   . Bruxism   . Colon cancer ([place])   . Essential hypertension, benign   . Family history of breast cancer   . Family history of melanoma   . GERD   . Hyperlipidemia    Since age 18  . Meniere's disease     Past Surgical History:  Procedure Laterality Date  . BIOPSY N/A 03/20/2013   Procedure: BIOPSY;  Surgeon: [name], MD;  Location: [facility];  Service: Endoscopy;  Laterality: N/A;  Possible small bowel biopsy  . BIOPSY  07/05/2018   Procedure: BIOPSY;  Surgeon: [name], MD;  Location: [facility];  Service: Endoscopy;;  rectal mass  . COLONOSCOPY WITH PROPOFOL N/A 07/05/2018   Procedure: COLONOSCOPY WITH PROPOFOL;  Surgeon: [name], MD;  Location: [facility];  Service: Endoscopy;  Laterality: N/A;  8:30am  . ESOPHAGOGASTRODUODENOSCOPY N/A 03/20/2013   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  One slightly nodular.  Small hiatal hernia.  Multiple fundic gland appearing polyps. no evidence of celiac disease, malignancy, h.pylori, barrett's.   . None    . POLYPECTOMY  07/05/2018   Procedure: POLYPECTOMY;  Surgeon: [name], MD;  Location: [facility];  Service: Endoscopy;;  hepatic flexurex2;  . PORTACATH PLACEMENT N/A 08/23/2018   Procedure: INSERTION PORT-A-CATH;  Surgeon: [name], MD;  Location: [facility];  Service: General;  Laterality: N/A;  LMA  . XI ROBOTIC ASSISTED  LOWER ANTERIOR RESECTION      Prior to Admission medications   Medication Sig Start Date End Date Taking? Authorizing Provider  acetaminophen (TYLENOL) 500 MG tablet Take 1,000 mg by mouth every 6 (six) hours as needed for moderate pain or headache.    Yes [provider]  Ascorbic Acid (VITAMIN C) 1000 MG tablet Take 1,000 mg by mouth daily.   Yes [provider]  b complex vitamins capsule Take 1 capsule by mouth daily.   Yes [provider]  diazepam (VALIUM) 5 MG tablet Take 2.5 mg by mouth at bedtime.  05/03/18  Yes [provider]  diphenhydrAMINE (BENADRYL) 25 MG tablet Take 25-50 mg by mouth 2 (two) times daily as needed for allergies.   Yes [provider]  diphenhydramine-acetaminophen (TYLENOL PM) 25-500 MG TABS tablet Take 1 tablet by mouth at bedtime as needed (sleep).   Yes [provider]  lidocaine-prilocaine (EMLA) cream Apply small amount over port site and cover with plastic wrap one hour prior to appointment. 08/22/18  Yes Derek Jack, MD  losartan-hydrochlorothiazide (HYZAAR) 100-12.5 MG tablet Take 0.5 tablets by mouth 2 (two) times daily.  05/02/19  Yes [provider]  Magnesium 250 MG TABS Take 400 mg by mouth daily.    Yes [provider]  methimazole (TAPAZOLE) 5 MG tablet Take 5 mg by mouth daily.  05/30/18  Yes [provider]  Multiple Vitamin (MULTIVITAMIN WITH MINERALS) TABS tablet  Take 1 tablet by mouth daily. Men's One-A-Day   Yes [provider]  pantoprazole (PROTONIX) 40 MG tablet TAKE ONE TABLET BY MOUTH TWICE DAILY BEFORE A MEAL Patient taking differently: Take 40 mg by mouth daily.  03/02/17  Yes Kynisha Memon, [name], MD  simvastatin (ZOCOR) 40 MG tablet Take 40 mg by mouth every evening.  12/07/12  Yes [provider]  cetirizine (ZYRTEC) 10 MG tablet Take 10 mg by mouth daily as needed for allergies.     [provider]  fluticasone (FLONASE) 50  MCG/ACT nasal spray Place 2 sprays into the nose daily. Patient taking differently: Place 2 sprays into the nose daily as needed (for seasonal allergies.).  10/19/11   Biagio Borg, MD  LORazepam (ATIVAN) 1 MG tablet Take 1 mg by mouth every 8 (eight) hours as needed for anxiety.    [provider]  ondansetron (ZOFRAN) 8 MG tablet Take 1 tablet (8 mg total) by mouth every 8 (eight) hours as needed for nausea or vomiting. 08/22/18   Derek Jack, MD  prochlorperazine (COMPAZINE) 10 MG tablet Take 10 mg by mouth every 6 (six) hours as needed for nausea or vomiting.    [provider]    Allergies as of 06/07/2019  . (No Known Allergies)    Family History  Problem Relation Age of Onset  . Breast cancer Mother 25  . Thyroid disease Mother        Thyroid removed  . Lung cancer Mother   . Crohn's disease Mother        ???  . Heart disease Father   . Stroke Father 22  . Hyperlipidemia Father   . AAA (abdominal aortic aneurysm) Father   . Valvular heart disease Half-Brother 34       Bicuspid aortic valve  . Lung cancer Maternal Aunt   . Heart disease Paternal Grandfather 57       d. 67  . Other Half-Sister        lump in her breastw  . Melanoma Nephew        pat 1/2 sisters' son dx 5x  . Colon cancer Neg Hx     Social History   Socioeconomic History  . Marital status: Divorced    Spouse name: Not on file  . Number of children: 2  . Years of education: Not on file  . Highest education level: Not on file  Occupational History  . Occupation: works with Sales executive at [employer]  . Financial resource strain: Not on file  . Food insecurity    Worry: Not on file    Inability: Not on file  . Transportation needs    Medical: Not on file    Non-medical: Not on file  Tobacco Use  . Smoking status: Never Smoker  . Smokeless tobacco: Never Used  Substance and Sexual Activity  . Alcohol use: No  . Drug use: No  . Sexual activity: Yes   Lifestyle  . Physical activity    Days per week: Not on file    Minutes per session: Not on file  . Stress: Not on file  Relationships  . Social Herbalist on phone: Not on file    Gets together: Not on file    Attends religious service: Not on file    Active member of club or organization: Not on file    Attends meetings of clubs or organizations: Not on file  Relationship status: Not on file  . Intimate partner violence    Fear of current or ex partner: Not on file    Emotionally abused: Not on file    Physically abused: Not on file    Forced sexual activity: Not on file  Other Topics Concern  . Not on file  Social History Narrative   Unable to ask intimate partner questions.    Review of Systems: See HPI, otherwise negative ROS  Physical Exam: BP 132/86   Pulse 75   Temp 98.3 F (36.8 C) (Oral)   Resp 18   Ht 6\' 3"  (1.905 m)   Wt 99.8 kg   SpO2 98%   BMI 27.50 kg/m  General:   Alert,  Well-developed, well-nourished, pleasant and cooperative in NAD Mouth:  No deformity or lesions. Neck:  Supple; no masses or thyromegaly. No significant cervical adenopathy. Lungs:  Clear throughout to auscultation.   No wheezes, crackles, or rhonchi. No acute distress. Heart:  Regular rate and rhythm; no murmurs, clicks, rubs,  or gallops. Abdomen: Non-distended, normal bowel sounds.  Soft and nontender without appreciable mass or hepatosplenomegaly.  Pulses:  Normal pulses noted. Extremities:  Without clubbing or edema.  Impression/Plan: History of stage III colorectal cancer restless leg anterior resection.  Clinically doing well.  Here for 1 year follow-up colonoscopy per plan.  The risks, benefits, limitations, alternatives and imponderables have been reviewed with the patient. Questions have been answered. All parties are agreeable.      Notice: This dictation was prepared with Dragon dictation along with smaller phrase technology. Any transcriptional errors  that result from this process are unintentional and may not be corrected upon review.

## 2019-07-25 NOTE — Op Note (Signed)
[facility] Patient Name: Jacob Hayes Procedure Date: 07/25/2019 7:17 AM MRN: [id number] Date of Birth: 06/10/1981 Attending MD: Norvel Richards , MD CSN: [id number] Age: 38 Admit Type: Outpatient Procedure:                Colonoscopy Indications:              High risk colon cancer surveillance: Personal                            history of colon cancer Providers:                Norvel Richards, MD, Otis Peak B. Sharon Seller, RN,                            Aram Candela Referring MD:              Medicines:                Propofol per Anesthesia Complications:            No immediate complications. Estimated Blood Loss:     Estimated blood loss: none. Procedure:                Pre-Anesthesia Assessment:                           - Prior to the procedure, a History and Physical                            was performed, and patient medications and                            allergies were reviewed. The patient's tolerance of                            previous anesthesia was also reviewed. The risks                            and benefits of the procedure and the sedation                            options and risks were discussed with the patient.                            All questions were answered, and informed consent                            was obtained. Prior Anticoagulants: The patient has                            taken no previous anticoagulant or antiplatelet                            agents. ASA Grade Assessment: II - A patient with  mild systemic disease. After reviewing the risks                            and benefits, the patient was deemed in                            satisfactory condition to undergo the procedure.                           After obtaining informed consent, the colonoscope                            was passed under direct vision. Throughout the                            procedure, the patient's blood pressure,  pulse, and                            oxygen saturations were monitored continuously. The                            [id number]) scope was introduced through                            the anus and advanced to the the cecum, identified                            by appendiceal orifice and ileocecal valve. The                            ileocecal valve, appendiceal orifice, and rectum                            were photographed. Scope In: 7:41:57 AM Scope Out: 7:55:06 AM Scope Withdrawal Time: 0 hours 8 minutes 58 seconds  Total Procedure Duration: 0 hours 13 minutes 9 seconds  Findings:      The perianal and digital rectal examinations were normal. Anastomosis at       10 cm from the anal verge.. Normal.      Multiple medium-mouthed diverticula were found in distal colon.      The exam was otherwise without abnormality on direct and retroflexion       views. Impression:               - Diverticulosis. Surgical anastomosis at 10 cm.                            Evidence of neoplasm.                           - The examination was otherwise normal on direct                            and retroflexion views.                           -  No specimens collected. Moderate Sedation:      Moderate (conscious) sedation was personally administered by an       anesthesia professional. The following parameters were monitored: oxygen       saturation, heart rate, blood pressure, respiratory rate, EKG, adequacy       of pulmonary ventilation, and response to care. Recommendation:           - Patient has a contact number available for                            emergencies. The signs and symptoms of potential                            delayed complications were discussed with the                            patient. Return to normal activities tomorrow.                            Written discharge instructions were provided to the                            patient.                            - Resume previous diet.                           - Continue present medications.                           - Repeat colonoscopy in 3 years for surveillance.                           - Return to GI office (date not yet determined). Procedure Code(s):        --- Professional ---                           [phone], Colonoscopy, flexible; diagnostic, including                            collection of specimen(s) by brushing or washing,                            when performed (separate procedure) Diagnosis Code(s):        --- Professional ---                           [id number], Personal history of other malignant                            neoplasm of large intestine                           K57.30, Diverticulosis of large intestine without  perforation or abscess without bleeding CPT copyright 2019 American Medical Association. All rights reserved. The codes documented in this report are preliminary and upon coder review may  be revised to meet current compliance requirements. [name]. Newell Wafer, MD Norvel Richards, MD 07/25/2019 8:06:47 AM This report has been signed electronically. Number of Addenda: 0

## 2019-07-25 NOTE — Anesthesia Postprocedure Evaluation (Signed)
Anesthesia Post Note  Patient: Jacob Hayes  Procedure(s) Performed: COLONOSCOPY WITH PROPOFOL (N/A )  Patient location during evaluation: PACU Anesthesia Type: MAC Level of consciousness: awake, oriented and awake and alert Pain management: pain level controlled Vital Signs Assessment: post-procedure vital signs reviewed and stable Respiratory status: spontaneous breathing, respiratory function stable and nonlabored ventilation Cardiovascular status: stable Postop Assessment: no apparent nausea or vomiting Anesthetic complications: no     Last Vitals:  Vitals:   07/25/19 0647  BP: 132/86  Pulse: 75  Resp: 18  Temp: 36.8 C  SpO2: 98%    Last Pain:  Vitals:   07/25/19 0733  TempSrc:   PainSc: 0-No pain                 Shadai Mcclane

## 2019-07-25 NOTE — Anesthesia Preprocedure Evaluation (Signed)
Anesthesia Evaluation  Patient identified by MRN, date of birth, ID band Patient awake    Reviewed: Allergy & Precautions, NPO status , Patient's Chart, lab work & pertinent test results  Airway Mallampati: I  TM Distance: >3 FB Neck ROM: Full    Dental no notable dental hx. (+) Teeth Intact   Pulmonary neg pulmonary ROS,    Pulmonary exam normal breath sounds clear to auscultation       Cardiovascular Exercise Tolerance: Good hypertension, Pt. on medications negative cardio ROS Normal cardiovascular examI Rhythm:Regular Rate:Normal     Neuro/Psych Anxiety Depression negative neurological ROS  negative psych ROS   GI/Hepatic Neg liver ROS, GERD  Medicated and Controlled,Here for surveillance of Cancer Last CT 03/18/19   Endo/Other  negative endocrine ROS  Renal/GU negative Renal ROS  negative genitourinary   Musculoskeletal negative musculoskeletal ROS (+)   Abdominal   Peds negative pediatric ROS (+)  Hematology negative hematology ROS (+)   Anesthesia Other Findings   Reproductive/Obstetrics negative OB ROS                             Anesthesia Physical Anesthesia Plan  ASA: II  Anesthesia Plan: General   Post-op Pain Management:    Induction: Intravenous  PONV Risk Score and Plan: 2 and TIVA, Propofol infusion and Treatment may vary due to age or medical condition  Airway Management Planned: Nasal Cannula and Simple Face Mask  Additional Equipment:   Intra-op Plan:   Post-operative Plan:   Informed Consent: I have reviewed the patients History and Physical, chart, labs and discussed the procedure including the risks, benefits and alternatives for the proposed anesthesia with the patient or authorized representative who has indicated his/her understanding and acceptance.     Dental advisory given  Plan Discussed with: CRNA  Anesthesia Plan Comments: (Plan Full  PPE use  Plan GA with GETA as needed d/w pt -WTP with same after Q&A)        Anesthesia Quick Evaluation

## 2019-07-31 ENCOUNTER — Encounter (HOSPITAL_COMMUNITY): Payer: Self-pay | Admitting: Internal Medicine

## 2019-08-13 ENCOUNTER — Other Ambulatory Visit (HOSPITAL_COMMUNITY): Payer: Commercial Managed Care - PPO

## 2019-09-18 NOTE — Progress Notes (Signed)
[place]   [phone] [phone]   Clinic Follow up Note   Patient Care Team: Chesley Noon, MD as PCP - General (Family Medicine) [name], MD (Cardiology) [name], MD as Attending Physician (Gastroenterology)  Date of Service:  09/23/2019  CHIEF COMPLAINT: f/u colon cancer  SUMMARY OF ONCOLOGIC HISTORY: Oncology History Overview Note  Cancer Staging Cancer of sigmoid colon [facility]) Staging form: Colon and Rectum, AJCC 8th Edition - Pathologic stage from 08/07/2018: Stage IIIB (pT3, pN1a, cM0) - Signed by Truitt Merle, MD on 10/25/2018     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 07/05/18  IMPRESSION - Tumor in the rectum. Biopsied. - Diverticulosis in the entire examined colon. - Two 5 to 6 mm polyps, removed with a cold snare. Resected and retrieved. - The examination was otherwise normal on direct and retroflexion views.    07/05/2018 Initial Biopsy   Diagnosis 07/05/18 1. Colon, polyp(s), hepatic flexure - TUBULAR ADENOMA (ONE). - NO HIGH GRADE DYSPLASIA OR MALIGNANCY. 2. Rectum, biopsy, mass - INVASIVE MODERATELY DIFFERENTIATED COLORECTAL ADENOCARCINOMA.   07/10/2018 Imaging   CT CAP W Constrast 07/10/18  IMPRESSION: 1. Mass within the rectosigmoid colon is identified compatible with findings from recent colonoscopy. 2. No evidence for nodal metastasis or distant metastatic disease. 3. Left kidney cyst.   07/17/2018 Imaging   MRI PElvis 07/17/18  IMPRESSION: 4.2 cm polypoid lesion along the right lateral aspect of the upper rectum, corresponding to the patient's biopsy-proven rectal adenocarcinoma, as above. Rectal adenocarcinoma T stage:  T1 or T2 Rectal adenocarcinoma N stage:  N0 Distance from tumor to the anal sphincter is 14  cm.   08/07/2018 Initial Diagnosis   Rectosigmoid cancer ([place])   08/07/2018 Surgery   XI ROBOTIC LOW ANTERIOR RESECTION by Dr. Kieth Brightly and Dr. Marcello Moores    08/07/2018 Pathologic Stage   Diagnosis 08/07/18 1. Colon, segmental resection for tumor, rectosigmoid - INVASIVE MODERATELY DIFFERENTIATED COLORECTAL ADENOCARCINOMA, 3.5 CM. - CARCINOMA FOCALLY INVOLVES PERICOLONIC CONNECTIVE TISSUE. - METASTATIC CARCINOMA IN ONE OF FIFTEEN LYMPH NODES (1/15). - MARGINS NOT INVOLVED. 2. Colon, resection margin (donut), distal ring, sigmoid - BENIGN COLON. - NO EVIDENCE OF MALIGNANCY.   08/07/2018 Cancer Staging   Staging form: Colon and Rectum, AJCC 8th Edition - Pathologic stage from 08/07/2018: Stage IIIB (pT3, pN1a, cM0) - Signed by Truitt Merle, MD on 10/25/2018   08/29/2018 -  Chemotherapy   Adjuvant CAPOX every 3 weeks with Xeloda 2039m twice daily 2 weeks on 1 week off starting 08/29/18. D/c Oxaliplatin after 09/28/18 (cycle 2) due to very poor toleration. Increased to 20056min AM and 250064mn PM starting with cycle 5.  Last 8th cycle dose was taken 02/26/19.    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  The Multi-Gene Panel offered by [facility] includes sequencing and/or deletion duplication testing of the following 84 genes: AIP, ALK, APC, ATM, AXIN2,BAP1,  BARD1, BLM, BMPR1A, BRCA1, BRCA2, BRIP1, CASR, CDC73, CDH1, CDK4, CDKN1B, CDKN1C, CDKN2A (p14ARF), CDKN2A (p16INK4a), CEBPA, CHEK2, CTNNA1, DICER1, DIS3L2, EGFR (c.2369C>T, p.Thr790Met variant only), EPCAM (Deletion/duplication testing only), FH, FLCN, GATA2, GPC3, GREM1 (Promoter region deletion/duplication testing only), HOXB13 (c.251G>A, p.Gly84Glu), HRAS, KIT, MAX, MEN1, MET, MITF (c.952G>A, p.Glu318Lys variant only), MLH1, MSH2, MSH3, MSH6, MUTYH, NBN, NF1, NF2, NTHL1, PALB2, PDGFRA, PHOX2B,  PMS2, POLD1, POLE, POT1, PRKAR1A, PTCH1, PTEN, RAD50, RAD51C, RAD51D, RB1, RECQL4, RET, RUNX1, SDHAF2, SDHA  (sequence changes only), SDHB, SDHC, SDHD, SMAD4, SMARCA4, SMARCB1, SMARCE1, STK11, SUFU, TERC, TERT, TMEM127, TP53, TSC1, TSC2, VHL, WRN and WT1.  The report date is 11/22/2018.   03/20/2019 Imaging   CT CAP 03/20/19  IMPRESSION: 1.  Interval postoperative findings of rectal resection.  2. No definite evidence of metastatic disease in the chest, abdomen, or pelvis.  3. No change in small prominent retroperitoneal lymph nodes. Attention on follow-up.  4. There is a subtle subcapsular hyperdensity of the dome of the liver, likely focal fatty sparing (series 2, image 49). Attention on follow-up.   07/25/2019 Procedure   Colonoscopy by [name] 07/25/19  IMPRESSION - Diverticulosis. Surgical anastomosis at 10 cm. Evidence of neoplasm. - The examination was otherwise normal on direct and retroflexion views. - No specimens collected.   09/20/2019 Imaging   CT AP W Contrast 09/20/19  IMPRESSION: Redemonstrated postoperative findings of rectosigmoid resection and reanastomosis. No evidence of recurrent or metastatic disease in the abdomen or pelvis.      CURRENT THERAPY:  Surveillance  INTERVAL HISTORY:  Jacob Hayes is here for a follow up of colon cancer. He presents to the clinic alone. He notes he is doing well. He has been back to work since 05/2019. He notes his neuropathy is resolving slowly, now mainly in his fingernails. He notes the cold or leaving his hands in a fixed position for a long period of time he will have cramps.    REVIEW OF SYSTEMS:   Constitutional: Denies fevers, chills or abnormal weight loss Eyes: Denies blurriness of vision Ears, nose, mouth, throat, and face: Denies mucositis or sore throat Respiratory: Denies cough, dyspnea or wheezes Cardiovascular: Denies palpitation, chest discomfort or lower extremity swelling Gastrointestinal:  Denies nausea, heartburn or change in bowel habits Skin: Denies abnormal skin rashes  MSK: (+) Joint cramps    Lymphatics: Denies new lymphadenopathy or easy bruising Neurological: (+) Cold sensitivity and resolving neuropathy Behavioral/Psych: Mood is stable, no new changes  All other systems were reviewed with the patient and are negative.  MEDICAL HISTORY:  Past Medical History:  Diagnosis Date   Allergic rhinitis    Anxiety    Anxiety and depression    DENIES    Bruxism    Colon cancer ([place])    Essential hypertension, benign    Family history of breast cancer    Family history of melanoma    GERD    Hyperlipidemia    Since age 21   Meniere's disease     SURGICAL HISTORY: Past Surgical History:  Procedure Laterality Date   BIOPSY N/A 03/20/2013   Procedure: BIOPSY;  Surgeon: [name], MD;  [facility];  Service: Endoscopy;  Laterality: N/A;  Possible small bowel biopsy   BIOPSY  07/05/2018   Procedure: BIOPSY;  Surgeon: [name], MD;  [facility];  Service: Endoscopy;;  rectal mass   COLONOSCOPY WITH PROPOFOL N/A 07/05/2018   Procedure: COLONOSCOPY WITH PROPOFOL;  Surgeon: [name], MD;  [facility];  Service: Endoscopy;  Laterality: N/A;  8:30am   COLONOSCOPY WITH PROPOFOL N/A 07/25/2019   Procedure: COLONOSCOPY WITH PROPOFOL;  Surgeon: [name], MD;  [facility];  Service: Endoscopy;  Laterality: N/A;  2:15pm   ESOPHAGOGASTRODUODENOSCOPY N/A 03/20/2013   [name], in the upper esophagus, through the upper esophageal sphincter, multiple areas  of salmon-colored epithelium consistent with inlet patches.  One slightly nodular.  Small hiatal hernia.  Multiple fundic gland appearing polyps. no evidence of celiac disease, malignancy, h.pylori, barrett's.    None     POLYPECTOMY  07/05/2018   Procedure: POLYPECTOMY;  Surgeon: [name], MD;  [facility];  Service: Endoscopy;;  hepatic flexurex2;   PORTACATH PLACEMENT N/A 08/23/2018   Procedure: INSERTION PORT-A-CATH;  Surgeon:  [name], MD;  Location: [facility];  Service: General;  Laterality: N/A;  LMA   XI ROBOTIC ASSISTED LOWER ANTERIOR RESECTION      I have reviewed the social history and family history with the patient and they are unchanged from previous note.  ALLERGIES:  has No Known Allergies.  MEDICATIONS:  Current Outpatient Medications  Medication Sig Dispense Refill   acetaminophen (TYLENOL) 500 MG tablet Take 1,000 mg by mouth every 6 (six) hours as needed for moderate pain or headache.      Ascorbic Acid (VITAMIN C) 1000 MG tablet Take 1,000 mg by mouth daily.     b complex vitamins capsule Take 1 capsule by mouth daily.     cetirizine (ZYRTEC) 10 MG tablet Take 10 mg by mouth daily as needed for allergies.      diazepam (VALIUM) 5 MG tablet Take 2.5 mg by mouth at bedtime.   1   diphenhydrAMINE (BENADRYL) 25 MG tablet Take 25-50 mg by mouth 2 (two) times daily as needed for allergies.     diphenhydramine-acetaminophen (TYLENOL PM) 25-500 MG TABS tablet Take 1 tablet by mouth at bedtime as needed (sleep).     fluticasone (FLONASE) 50 MCG/ACT nasal spray Place 2 sprays into the nose daily. (Patient taking differently: Place 2 sprays into the nose daily as needed (for seasonal allergies.). ) 16 g 11   lidocaine-prilocaine (EMLA) cream Apply small amount over port site and cover with plastic wrap one hour prior to appointment. 30 g 3   LORazepam (ATIVAN) 1 MG tablet Take 1 mg by mouth every 8 (eight) hours as needed for anxiety.     losartan-hydrochlorothiazide (HYZAAR) 100-12.5 MG tablet Take 0.5 tablets by mouth 2 (two) times daily.      Magnesium 250 MG TABS Take 400 mg by mouth daily.      methimazole (TAPAZOLE) 5 MG tablet Take 5 mg by mouth daily.      Multiple Vitamin (MULTIVITAMIN WITH MINERALS) TABS tablet Take 1 tablet by mouth daily. Men's One-A-Day     ondansetron (ZOFRAN) 8 MG tablet Take 1 tablet (8 mg total) by mouth every 8 (eight) hours as needed for nausea or  vomiting. 30 tablet 1   pantoprazole (PROTONIX) 40 MG tablet TAKE ONE TABLET BY MOUTH TWICE DAILY BEFORE A MEAL (Patient taking differently: Take 40 mg by mouth daily. ) 60 tablet 0   prochlorperazine (COMPAZINE) 10 MG tablet Take 10 mg by mouth every 6 (six) hours as needed for nausea or vomiting.     simvastatin (ZOCOR) 40 MG tablet Take 40 mg by mouth every evening.      No current facility-administered medications for this visit.     PHYSICAL EXAMINATION: ECOG PERFORMANCE STATUS: 0 - Asymptomatic  Vitals:   09/23/19 1338  BP: 119/87  Pulse: 81  Resp: 18  Temp: 98 F (36.7 C)  SpO2: 99%   Filed Weights   09/23/19 1338  Weight: 226 lb (102.5 kg)    GENERAL:alert, no distress and comfortable SKIN: skin color, texture, turgor are normal,  no rashes or significant lesions EYES: normal, Conjunctiva are pink and non-injected, sclera clear  NECK: supple, thyroid normal size, non-tender, without nodularity LYMPH:  no palpable lymphadenopathy in the cervical, axillary  LUNGS: clear to auscultation and percussion with normal breathing effort HEART: regular rate & rhythm and no murmurs and no lower extremity edema ABDOMEN:abdomen soft, non-tender and normal bowel sounds Musculoskeletal:no cyanosis of digits and no clubbing  NEURO: alert & oriented x 3 with fluent speech, no focal motor/sensory deficits  LABORATORY DATA:  I have reviewed the data as listed CBC Latest Ref Rng & Units 09/20/2019 06/21/2019 03/20/2019  WBC 4.0 - 10.5 K/uL 4.1 3.6(L) 3.6(L)  Hemoglobin 13.0 - 17.0 g/dL 14.6 15.4 13.8  Hematocrit 39.0 - 52.0 % 42.4 44.8 39.2  Platelets 150 - 400 K/uL 235 224 206     CMP Latest Ref Rng & Units 09/20/2019 06/21/2019 03/20/2019  Glucose 70 - 99 mg/dL 86 91 90  BUN 6 - 20 mg/dL '14 13 12  ' Creatinine 0.61 - 1.24 mg/dL 1.00 0.98 0.99  Sodium 135 - 145 mmol/L 136 139 140  Potassium 3.5 - 5.1 mmol/L 3.8 4.0 3.6  Chloride 98 - 111 mmol/L 104 106 106  CO2 22 - 32 mmol/L '24  26 25  ' Calcium 8.9 - 10.3 mg/dL 8.5(L) 9.1 8.3(L)  Total Protein 6.5 - 8.1 g/dL 6.8 7.2 7.1  Total Bilirubin 0.3 - 1.2 mg/dL 0.6 0.5 0.6  Alkaline Phos 38 - 126 U/L 73 79 85  AST 15 - 41 U/L '16 18 25  ' ALT 0 - 44 U/L '16 19 27    ' Colonoscopy by [name] 07/25/19  IMPRESSION - Diverticulosis. Surgical anastomosis at 10 cm. Evidence of neoplasm. - The examination was otherwise normal on direct and retroflexion views. - No specimens collected.   RADIOGRAPHIC STUDIES: I have personally reviewed the radiological images as listed and agreed with the findings in the report. No results found.   ASSESSMENT & PLAN:  Jacob Hayes is a 38 y.o. male with   1. Rectosigmoid Cancer,Stage IIIB(pT3N1M0), MMR normal -He was diagnosed in 06/2018.He is s/p anterior resectionwith clear margins.Due to the tumor location mainly in sigmoid colon, radiation was not recommended. -Due to the high risk of recurrence, he started adjuvant chemotherapy withCAPOX. However he toleratedOxaliplatinpoorly and was stopped after 2 cycles. He is toleratingXelodawell and completed 8 cycles (including CAPOX). -We discussed his Colonoscopy from 07/25/19 which was benign  -We discussed his CT AP from 09/20/19 which shows no evidence of disease. Previous findings have resolved.  -He is clinically doing well. His neuropathy is resolving and still has occasional joint cramping. Labs reviewed from last week, CBC and CMP except 8.5. CEA WNL. I encouraged him to increase dairy in diet or take oral calcium.  -I discussed he is over 1 year from diagnosis. Will repeat scan in 6 months then yearly during the first 3 years of surveillance. I discussed his risk of recurrence is highest the first 2-3 years, will monitor for 5 years.  -He will be fine to remove port later next year, will continue flushes every 8 weeks.  -F/u in 4 months    2. HTN -On Losartan and HCTZ -BP controlled  -Continue to f/u with Dr.  Domenic Polite.  3. Genetic Testing Negative    PLAN: -Flu shot today  -CT scan reviewed, NED. He is clinically doing well  -Flush in 2 and 4 months -Labs and f/u in 4 months  -plan to repeat CT CAP  in 6 months    No problem-specific Assessment & Plan notes found for this encounter.   No orders of the defined types were placed in this encounter.  All questions were answered. The patient knows to call the clinic with any problems, questions or concerns. No barriers to learning was detected. I spent 20 minutes counseling the patient face to face. The total time spent in the appointment was 25 minutes and more than 50% was on counseling and review of test results     Truitt Merle, MD 09/23/2019   I, Joslyn Devon, am acting as scribe for Truitt Merle, MD.   I have reviewed the above documentation for accuracy and completeness, and I agree with the above.

## 2019-09-20 ENCOUNTER — Ambulatory Visit (HOSPITAL_COMMUNITY)
Admission: RE | Admit: 2019-09-20 | Discharge: 2019-09-20 | Disposition: A | Discharge status: Home / Self Care | Payer: Commercial Managed Care - PPO | Source: Ambulatory Visit | Attending: Hematology | Admitting: Hematology

## 2019-09-20 ENCOUNTER — Inpatient Hospital Stay: Payer: Commercial Managed Care - PPO | Attending: Hematology

## 2019-09-20 ENCOUNTER — Encounter (HOSPITAL_COMMUNITY): Payer: Self-pay

## 2019-09-20 ENCOUNTER — Encounter: Payer: Self-pay | Admitting: Hematology

## 2019-09-20 ENCOUNTER — Other Ambulatory Visit: Payer: Self-pay

## 2019-09-20 ENCOUNTER — Inpatient Hospital Stay: Payer: Commercial Managed Care - PPO

## 2019-09-20 DIAGNOSIS — C19 Malignant neoplasm of rectosigmoid junction: Secondary | ICD-10-CM | POA: Insufficient documentation

## 2019-09-20 DIAGNOSIS — C187 Malignant neoplasm of sigmoid colon: Secondary | ICD-10-CM | POA: Diagnosis present

## 2019-09-20 LAB — CBC WITH DIFFERENTIAL (CANCER CENTER ONLY)
Abs Immature Granulocytes: 0.02 10*3/uL (ref 0.00–0.07)
Basophils Absolute: 0 10*3/uL (ref 0.0–0.1)
Basophils Relative: 1 %
Eosinophils Absolute: 0 10*3/uL (ref 0.0–0.5)
Eosinophils Relative: 1 %
HCT: 42.4 % (ref 39.0–52.0)
Hemoglobin: 14.6 g/dL (ref 13.0–17.0)
Immature Granulocytes: 1 %
Lymphocytes Relative: 29 %
Lymphs Abs: 1.2 10*3/uL (ref 0.7–4.0)
MCH: 31.1 pg (ref 26.0–34.0)
MCHC: 34.4 g/dL (ref 30.0–36.0)
MCV: 90.4 fL (ref 80.0–100.0)
Monocytes Absolute: 0.4 10*3/uL (ref 0.1–1.0)
Monocytes Relative: 9 %
Neutro Abs: 2.4 10*3/uL (ref 1.7–7.7)
Neutrophils Relative %: 59 %
Platelet Count: 235 10*3/uL (ref 150–400)
RBC: 4.69 MIL/uL (ref 4.22–5.81)
RDW: 11.9 % (ref 11.5–15.5)
WBC Count: 4.1 10*3/uL (ref 4.0–10.5)
nRBC: 0 % (ref 0.0–0.2)

## 2019-09-20 LAB — CMP (CANCER CENTER ONLY)
ALT: 16 U/L (ref 0–44)
AST: 16 U/L (ref 15–41)
Albumin: 4 g/dL (ref 3.5–5.0)
Alkaline Phosphatase: 73 U/L (ref 38–126)
Anion gap: 8 (ref 5–15)
BUN: 14 mg/dL (ref 6–20)
CO2: 24 mmol/L (ref 22–32)
Calcium: 8.5 mg/dL — ABNORMAL LOW (ref 8.9–10.3)
Chloride: 104 mmol/L (ref 98–111)
Creatinine: 1 mg/dL (ref 0.61–1.24)
GFR, Est AFR Am: >60 mL/min (ref >60)
GFR, Estimated: >60 mL/min (ref >60)
Glucose, Bld: 86 mg/dL (ref 70–99)
Potassium: 3.8 mmol/L (ref 3.5–5.1)
Sodium: 136 mmol/L (ref 135–145)
Total Bilirubin: 0.6 mg/dL (ref 0.3–1.2)
Total Protein: 6.8 g/dL (ref 6.5–8.1)

## 2019-09-20 LAB — CEA (IN HOUSE-CHCC): CEA (CHCC-In House): 1.56 ng/mL (ref 0.00–5.00)

## 2019-09-20 MED ORDER — SODIUM CHLORIDE (PF) 0.9 % IJ SOLN
INTRAMUSCULAR | Status: AC
  Filled 2019-09-20: qty 50

## 2019-09-20 MED ORDER — HEPARIN SOD (PORK) LOCK FLUSH 100 UNIT/ML IV SOLN
INTRAVENOUS | Status: AC
  Filled 2019-09-20: qty 5

## 2019-09-20 MED ORDER — HEPARIN SOD (PORK) LOCK FLUSH 100 UNIT/ML IV SOLN
500.0000 [IU] | Freq: Once | INTRAVENOUS | Status: AC
  Administered 2019-09-20: 500 [IU] via INTRAVENOUS

## 2019-09-20 MED ORDER — IOHEXOL 300 MG/ML  SOLN
100.0000 mL | Freq: Once | INTRAMUSCULAR | Status: AC | PRN
  Administered 2019-09-20: 100 mL via INTRAVENOUS

## 2019-09-23 ENCOUNTER — Telehealth: Payer: Self-pay | Admitting: Hematology

## 2019-09-23 ENCOUNTER — Other Ambulatory Visit: Payer: Self-pay

## 2019-09-23 ENCOUNTER — Inpatient Hospital Stay: Payer: Commercial Managed Care - PPO | Attending: Hematology | Admitting: Hematology

## 2019-09-23 ENCOUNTER — Encounter: Payer: Self-pay | Admitting: Hematology

## 2019-09-23 VITALS — BP 119/87 | HR 81 | Temp 98.0°F | Resp 18 | Ht 75.0 in | Wt 226.0 lb

## 2019-09-23 DIAGNOSIS — C187 Malignant neoplasm of sigmoid colon: Secondary | ICD-10-CM

## 2019-09-23 DIAGNOSIS — I10 Essential (primary) hypertension: Secondary | ICD-10-CM

## 2019-09-23 DIAGNOSIS — G629 Polyneuropathy, unspecified: Secondary | ICD-10-CM | POA: Insufficient documentation

## 2019-09-23 DIAGNOSIS — C19 Malignant neoplasm of rectosigmoid junction: Secondary | ICD-10-CM | POA: Diagnosis present

## 2019-09-23 DIAGNOSIS — Z23 Encounter for immunization: Secondary | ICD-10-CM

## 2019-09-23 MED ORDER — INFLUENZA VAC SPLIT QUAD 0.5 ML IM SUSY
0.5000 mL | PREFILLED_SYRINGE | Freq: Once | INTRAMUSCULAR | Status: AC
  Administered 2019-09-23: 14:00:00 0.5 mL via INTRAMUSCULAR

## 2019-09-23 MED ORDER — INFLUENZA VAC SPLIT QUAD 0.5 ML IM SUSY
PREFILLED_SYRINGE | INTRAMUSCULAR | Status: AC
  Filled 2019-09-23: qty 0.5

## 2019-09-23 NOTE — Telephone Encounter (Signed)
Scheduled appt per 11/2 los - pt aware of appts scheduled. My chart active. Per pt no print out needed. Pt also request port flush be in December instead of two months so that it can be on this years insurance.

## 2019-11-11 ENCOUNTER — Telehealth: Payer: Self-pay | Admitting: Hematology

## 2019-11-11 NOTE — Telephone Encounter (Signed)
Returned patient's phone call regarding rescheduling an appointment, left a voicemal.

## 2019-11-11 NOTE — Telephone Encounter (Signed)
Returned patient's phone call regarding rescheduling 12/29 appointment, per patient's request appointment has moved to 12/30.

## 2019-11-20 ENCOUNTER — Inpatient Hospital Stay: Payer: Commercial Managed Care - PPO | Attending: Hematology

## 2019-11-20 ENCOUNTER — Other Ambulatory Visit: Payer: Self-pay

## 2019-11-20 DIAGNOSIS — Z452 Encounter for adjustment and management of vascular access device: Secondary | ICD-10-CM | POA: Insufficient documentation

## 2019-11-20 DIAGNOSIS — C187 Malignant neoplasm of sigmoid colon: Secondary | ICD-10-CM

## 2019-11-20 DIAGNOSIS — C19 Malignant neoplasm of rectosigmoid junction: Secondary | ICD-10-CM | POA: Insufficient documentation

## 2019-11-20 DIAGNOSIS — Z95828 Presence of other vascular implants and grafts: Secondary | ICD-10-CM

## 2019-11-20 MED ORDER — HEPARIN SOD (PORK) LOCK FLUSH 100 UNIT/ML IV SOLN
500.0000 [IU] | Freq: Once | INTRAVENOUS | Status: AC
  Administered 2019-11-20: 10:00:00 500 [IU]
  Filled 2019-11-20: qty 5

## 2019-11-20 MED ORDER — SODIUM CHLORIDE 0.9% FLUSH
10.0000 mL | Freq: Once | INTRAVENOUS | Status: AC
  Administered 2019-11-20: 10:00:00 10 mL
  Filled 2019-11-20: qty 10

## 2020-01-20 NOTE — Progress Notes (Signed)
Jacob Hayes   [phone] [phone]   Clinic Follow up Note   Patient Care Team: Chesley Noon, MD as PCP - General (Family Medicine) [name], MD (Cardiology) [name], MD as Attending Physician (Gastroenterology)  Date of Service:  01/24/2020  CHIEF COMPLAINT: f/u colon cancer  SUMMARY OF ONCOLOGIC HISTORY: Oncology History Overview Note  Cancer Staging Cancer of sigmoid colon [facility]) Staging form: Colon and Rectum, AJCC 8th Edition - Pathologic stage from 08/07/2018: Stage IIIB (pT3, pN1a, cM0) - Signed by Truitt Merle, MD on 10/25/2018     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 07/05/18  IMPRESSION - Tumor in the rectum. Biopsied. - Diverticulosis in the entire examined colon. - Two 5 to 6 mm polyps, removed with a cold snare. Resected and retrieved. - The examination was otherwise normal on direct and retroflexion views.    07/05/2018 Initial Biopsy   Diagnosis 07/05/18 1. Colon, polyp(s), hepatic flexure - TUBULAR ADENOMA (ONE). - NO HIGH GRADE DYSPLASIA OR MALIGNANCY. 2. Rectum, biopsy, mass - INVASIVE MODERATELY DIFFERENTIATED COLORECTAL ADENOCARCINOMA.   07/10/2018 Imaging   CT CAP W Constrast 07/10/18  IMPRESSION: 1. Mass within the rectosigmoid colon is identified compatible with findings from recent colonoscopy. 2. No evidence for nodal metastasis or distant metastatic disease. 3. Left kidney cyst.   07/17/2018 Imaging   MRI PElvis 07/17/18  IMPRESSION: 4.2 cm polypoid lesion along the right lateral aspect of the upper rectum, corresponding to the patient's biopsy-proven rectal adenocarcinoma, as above. Rectal adenocarcinoma T stage:  T1 or T2 Rectal adenocarcinoma N stage:  N0 Distance from tumor to the anal sphincter is 14  cm.   08/07/2018 Initial Diagnosis   Rectosigmoid cancer ([place])   08/07/2018 Surgery   XI ROBOTIC LOW ANTERIOR RESECTION by Dr. Kieth Brightly and Dr. Marcello Moores    08/07/2018 Pathologic Stage   Diagnosis 08/07/18 1. Colon, segmental resection for tumor, rectosigmoid - INVASIVE MODERATELY DIFFERENTIATED COLORECTAL ADENOCARCINOMA, 3.5 CM. - CARCINOMA FOCALLY INVOLVES PERICOLONIC CONNECTIVE TISSUE. - METASTATIC CARCINOMA IN ONE OF FIFTEEN LYMPH NODES (1/15). - MARGINS NOT INVOLVED. 2. Colon, resection margin (donut), distal ring, sigmoid - BENIGN COLON. - NO EVIDENCE OF MALIGNANCY.   08/07/2018 Cancer Staging   Staging form: Colon and Rectum, AJCC 8th Edition - Pathologic stage from 08/07/2018: Stage IIIB (pT3, pN1a, cM0) - Signed by Truitt Merle, MD on 10/25/2018   08/29/2018 -  Chemotherapy   Adjuvant CAPOX every 3 weeks with Xeloda 2038m twice daily 2 weeks on 1 week off starting 08/29/18. D/c Oxaliplatin after 09/28/18 (cycle 2) due to very poor toleration. Increased to 20067min AM and 250040mn PM starting with cycle 5.  Last 8th cycle dose was taken 02/26/19.    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  The Multi-Gene Panel offered by [facility] includes sequencing and/or deletion duplication testing of the following 84 genes: AIP, ALK, APC, ATM, AXIN2,BAP1,  BARD1, BLM, BMPR1A, BRCA1, BRCA2, BRIP1, CASR, CDC73, CDH1, CDK4, CDKN1B, CDKN1C, CDKN2A (p14ARF), CDKN2A (p16INK4a), CEBPA, CHEK2, CTNNA1, DICER1, DIS3L2, EGFR (c.2369C>T, p.Thr790Met variant only), EPCAM (Deletion/duplication testing only), FH, FLCN, GATA2, GPC3, GREM1 (Promoter region deletion/duplication testing only), HOXB13 (c.251G>A, p.Gly84Glu), HRAS, KIT, MAX, MEN1, MET, MITF (c.952G>A, p.Glu318Lys variant only), MLH1, MSH2, MSH3, MSH6, MUTYH, NBN, NF1, NF2, NTHL1, PALB2, PDGFRA, PHOX2B,  PMS2, POLD1, POLE, POT1, PRKAR1A, PTCH1, PTEN, RAD50, RAD51C, RAD51D, RB1, RECQL4, RET, RUNX1, SDHAF2, SDHA  (sequence changes only), SDHB, SDHC, SDHD, SMAD4, SMARCA4, SMARCB1, SMARCE1, STK11, SUFU, TERC, TERT, TMEM127, TP53, TSC1, TSC2, VHL, WRN and WT1.  The report date is 11/22/2018.   03/20/2019 Imaging   CT CAP 03/20/19  IMPRESSION: 1.  Interval postoperative findings of rectal resection.  2. No definite evidence of metastatic disease in the chest, abdomen, or pelvis.  3. No change in small prominent retroperitoneal lymph nodes. Attention on follow-up.  4. There is a subtle subcapsular hyperdensity of the dome of the liver, likely focal fatty sparing (series 2, image 49). Attention on follow-up.   07/25/2019 Procedure   Colonoscopy by [name] 07/25/19  IMPRESSION - Diverticulosis. Surgical anastomosis at 10 cm. Evidence of neoplasm. - The examination was otherwise normal on direct and retroflexion views. - No specimens collected.   09/20/2019 Imaging   CT AP W Contrast 09/20/19  IMPRESSION: Redemonstrated postoperative findings of rectosigmoid resection and reanastomosis. No evidence of recurrent or metastatic disease in the abdomen or pelvis.      CURRENT THERAPY:  Surveillance  INTERVAL HISTORY:  Jacob Hayes is here for a follow up of colon cancer. He presents to the clinic alone. He notes he is doing well and still working a lot. 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 He notes he has gained baseline weight back. He notes he does not feel comfortable doing care exercises but will still be able to do light lifting. He notes he is still fatigued. He is not as active as he was in the past.  He notes he has been experiencing a lot more occipital headaches lately. He notes he has an arachnoid cyst in 2014 which he notes was compressing his cerebellum. He notes he will get vertigo or motion sickness easily.     REVIEW OF SYSTEMS:   Constitutional: Denies fevers, chills or abnormal weight loss (+) Fatigue (+)Headaches and dizziness intermittent  Eyes: Denies blurriness of  vision Ears, nose, mouth, throat, and face: Denies mucositis or sore throat Respiratory: Denies cough, dyspnea or wheezes Cardiovascular: Denies palpitation, chest discomfort or lower extremity swelling Gastrointestinal:  Denies nausea, heartburn or change in bowel habits Skin: Denies abnormal skin rashes Lymphatics: Denies new lymphadenopathy or easy bruising Neurological:Denies numbness, tingling or new weaknesses Behavioral/Psych: Mood is stable, no new changes  All other systems were reviewed with the patient and are negative.  MEDICAL HISTORY:  Past Medical History:  Diagnosis Date  . Allergic rhinitis   . Anxiety   . Anxiety and depression    DENIES   . Bruxism   . Colon cancer ([place])   . Essential hypertension, benign   . Family history of breast cancer   . Family history of melanoma   . GERD   . Hyperlipidemia    Since age 70  . Meniere's disease     SURGICAL HISTORY: Past Surgical History:  Procedure Laterality Date  . BIOPSY N/A 03/20/2013   Procedure: BIOPSY;  Surgeon: [name], MD;  [facility];  Service: Endoscopy;  Laterality: N/A;  Possible small bowel biopsy  . BIOPSY  07/05/2018   Procedure: BIOPSY;  Surgeon: [name], MD;  [facility];  Service: Endoscopy;;  rectal mass  . COLONOSCOPY WITH PROPOFOL N/A 07/05/2018   Procedure: COLONOSCOPY WITH PROPOFOL;  Surgeon: [name], MD;  [facility];  Service: Endoscopy;  Laterality: N/A;  8:30am  .  COLONOSCOPY WITH PROPOFOL N/A 07/25/2019   Procedure: COLONOSCOPY WITH PROPOFOL;  Surgeon: [name], MD;  [facility];  Service: Endoscopy;  Laterality: N/A;  2:15pm  . ESOPHAGOGASTRODUODENOSCOPY N/A 03/20/2013   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  One slightly nodular.  Small hiatal hernia.  Multiple fundic gland appearing polyps. no evidence  of celiac disease, malignancy, h.pylori, barrett's.   . None    . POLYPECTOMY  07/05/2018   Procedure: POLYPECTOMY;  Surgeon: [name], MD;  [facility];  Service: Endoscopy;;  hepatic flexurex2;  . PORTACATH PLACEMENT N/A 08/23/2018   Procedure: INSERTION PORT-A-CATH;  Surgeon: [name], MD;  Location: [facility];  Service: General;  Laterality: N/A;  LMA  . XI ROBOTIC ASSISTED LOWER ANTERIOR RESECTION      I have reviewed the social history and family history with the patient and they are unchanged from previous note.  ALLERGIES:  has No Known Allergies.  MEDICATIONS:  Current Outpatient Medications  Medication Sig Dispense Refill  . acetaminophen (TYLENOL) 500 MG tablet Take 1,000 mg by mouth every 6 (six) hours as needed for moderate pain or headache.     . Ascorbic Acid (VITAMIN C) 1000 MG tablet Take 1,000 mg by mouth daily.    Marland Kitchen b complex vitamins capsule Take 1 capsule by mouth daily.    . cetirizine (ZYRTEC) 10 MG tablet Take 10 mg by mouth daily as needed for allergies.     . diazepam (VALIUM) 5 MG tablet Take 2.5 mg by mouth at bedtime.   1  . diphenhydrAMINE (BENADRYL) 25 MG tablet Take 25-50 mg by mouth 2 (two) times daily as needed for allergies.    . diphenhydramine-acetaminophen (TYLENOL PM) 25-500 MG TABS tablet Take 1 tablet by mouth at bedtime as needed (sleep).    . fluticasone (FLONASE) 50 MCG/ACT nasal spray Place 2 sprays into the nose daily. (Patient taking differently: Place 2 sprays into the nose daily as needed (for seasonal allergies.). ) 16 g 11  . lidocaine-prilocaine (EMLA) cream Apply small amount over port site and cover with plastic wrap one hour prior to appointment. 30 g 3  . LORazepam (ATIVAN) 1 MG tablet Take 1 mg by mouth every 8 (eight) hours as needed for anxiety.    Marland Kitchen losartan-hydrochlorothiazide (HYZAAR) 100-12.5 MG tablet Take 0.5 tablets by mouth 2 (two) times daily.     . Magnesium 250 MG TABS Take 400 mg by mouth daily.       . methimazole (TAPAZOLE) 5 MG tablet Take 5 mg by mouth daily.     . Multiple Vitamin (MULTIVITAMIN WITH MINERALS) TABS tablet Take 1 tablet by mouth daily. Men's One-A-Day    . ondansetron (ZOFRAN) 8 MG tablet Take 1 tablet (8 mg total) by mouth every 8 (eight) hours as needed for nausea or vomiting. 30 tablet 1  . pantoprazole (PROTONIX) 40 MG tablet TAKE ONE TABLET BY MOUTH TWICE DAILY BEFORE A MEAL (Patient taking differently: Take 40 mg by mouth daily. ) 60 tablet 0  . prochlorperazine (COMPAZINE) 10 MG tablet Take 10 mg by mouth every 6 (six) hours as needed for nausea or vomiting.    . simvastatin (ZOCOR) 40 MG tablet Take 40 mg by mouth every evening.      No current facility-administered medications for this visit.    PHYSICAL EXAMINATION: ECOG PERFORMANCE STATUS: 0 - Asymptomatic  Vitals:  01/24/20 1530 01/24/20 1531  BP: (!) 155/93 131/90  Pulse: 84   Resp: 18   Temp: 97.6 F (36.4 C)   SpO2: 97%    Filed Weights   01/24/20 1530  Weight: 229 lb 14.4 oz (104.3 kg)    GENERAL:alert, no distress and comfortable SKIN: skin color, texture, turgor are normal, no rashes or significant lesions EYES: normal, Conjunctiva are pink and non-injected, sclera clear  NECK: supple, thyroid normal size, non-tender, without nodularity LYMPH:  no palpable lymphadenopathy in the cervical, axillary  LUNGS: clear to auscultation and percussion with normal breathing effort HEART: regular rate & rhythm and no murmurs and no lower extremity edema ABDOMEN:abdomen soft, non-tender and normal bowel sounds (+) Lower midline surgical incision healed well (+) RUQ tenderness Musculoskeletal:no cyanosis of digits and no clubbing  NEURO: alert & oriented x 3 with fluent speech, no focal motor/sensory deficits  LABORATORY DATA:  I have reviewed the data as listed CBC Latest Ref Rng & Units 01/24/2020 09/20/2019 06/21/2019  WBC 4.0 - 10.5 K/uL 5.0 4.1 3.6(L)  Hemoglobin 13.0 - 17.0 g/dL 14.5 14.6  15.4  Hematocrit 39.0 - 52.0 % 41.6 42.4 44.8  Platelets 150 - 400 K/uL 265 235 224     CMP Latest Ref Rng & Units 01/24/2020 09/20/2019 06/21/2019  Glucose 70 - 99 mg/dL 93 86 91  BUN 6 - 20 mg/dL _0 Creatinine 0.61 - 1.24 mg/dL 1.12 1.00 0.98  Sodium 135 - 145 mmol/L 140 136 139  Potassium 3.5 - 5.1 mmol/L 3.8 3.8 4.0  Chloride 98 - 111 mmol/L 104 104 106  CO2 22 - 32 mmol/L _1 Calcium 8.9 - 10.3 mg/dL 8.8(L) 8.5(L) 9.1  Total Protein 6.5 - 8.1 g/dL 7.2 6.8 7.2  Total Bilirubin 0.3 - 1.2 mg/dL 0.5 0.6 0.5  Alkaline Phos 38 - 126 U/L 67 73 79  AST 15 - 41 U/L _2 ALT 0 - 44 U/L _3 RADIOGRAPHIC STUDIES: I have personally reviewed the radiological images as listed and agreed with the findings in the report. No results found.   ASSESSMENT & PLAN:  Jacob Hayes is a 39 y.o. male with    1. Rectosigmoid Cancer,Stage IIIB(pT3N1M0), MMR normal -He was diagnosed in 06/2018.He is s/p anterior resectionwith clear margins.Due to the tumor location mainly in sigmoid colon, radiation was not recommended. -Due to the high risk of recurrence, he startedadjuvant chemotherapy withCAPOX. However he toleratedOxaliplatinpoorly and was stopped after 2 cycles. He was able to complete 8 cycles Xeloda(including CAPOX). -His repeat Colonoscopy from 07/25/19 which was benign. Repeat in 2023. Continue Surveillance.  -He is clinically doing well. He notes having fatigue. 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 Labs reviewed, CBC and CMP WNL except Ca 8.8. CEA still pending.  -He is 1.5 years since diagnosis. The further away he is from diagnosis the lower risk of recurrence. Next scan in 06/2020. Afterward he is fine to have port removed.  -F/u in 5 months  -I encouraged him to proceed with COVID19 vaccine when it becomes available to him. He is interested.    2. HTN -On Losartan and HCTZ -BP controlled  -Continue to f/u with Dr. Domenic Polite.  3. Genetic  TestingNegative  4. Fatigue  -He has fatigue as he is not as active since cancer diagnosis.  -I recommend he increase his exercise as long as he is not limited.   5. Headaches, dizziness  -  He notes he has an arachnoid cyst in 2014 seen on CT head. This was worked up at [facility].   -He notes he easily gets motion sickness.  -I will f/u with CT head with next body scan or sooner if HA's worsen. He is agreeable.  -He also notes he has Mnire's disease, on Valium for this.     PLAN: -He is clinically doing well  -F/u in 5 months with lab and CT CAP and Head w contrast a few days before    No problem-specific Assessment & Plan notes found for this encounter.   Orders Placed This Encounter  Procedures  . CT Abdomen Pelvis W Contrast    Standing Status:   Future    Standing Expiration Date:   01/23/2021    Order Specific Question:   If indicated for the ordered procedure, I authorize the administration of contrast media per Radiology protocol    Answer:   Yes    Order Specific Question:   Preferred imaging location?    Answer:   [facility]    Order Specific Question:   Is Oral Contrast requested for this exam?    Answer:   Yes, Per Radiology protocol    Order Specific Question:   Radiology Contrast Protocol - do NOT remove file path    Answer:   \\[email or web address]  . CT Chest W Contrast    Standing Status:   Future    Standing Expiration Date:   01/23/2021    Order Specific Question:   If indicated for the ordered procedure, I authorize the administration of contrast media per Radiology protocol    Answer:   Yes    Order Specific Question:   Preferred imaging location?    Answer:   [facility]    Order Specific Question:   Radiology Contrast Protocol - do NOT remove file path    Answer:   \\[email or web address]  . CT Head W Wo Contrast    Standing Status:   Future    Standing Expiration Date:   01/24/2021     Order Specific Question:   If indicated for the ordered procedure, I authorize the administration of contrast media per Radiology protocol    Answer:   Yes    Order Specific Question:   Preferred imaging location?    Answer:   [facility]    Order Specific Question:   Radiology Contrast Protocol - do NOT remove file path    Answer:   \\[email or web address]   All questions were answered. The patient knows to call the clinic with any problems, questions or concerns. No barriers to learning was detected. The total time spent in the appointment was 30 minutes.     Truitt Merle, MD 01/24/2020   I, Joslyn Devon, am acting as scribe for Truitt Merle, MD.   I have reviewed the above documentation for accuracy and completeness, and I agree with the above.

## 2020-01-23 ENCOUNTER — Encounter: Payer: Self-pay | Admitting: Hematology

## 2020-01-24 ENCOUNTER — Inpatient Hospital Stay: Payer: Commercial Managed Care - PPO

## 2020-01-24 ENCOUNTER — Encounter: Payer: Self-pay | Admitting: Hematology

## 2020-01-24 ENCOUNTER — Inpatient Hospital Stay: Payer: Commercial Managed Care - PPO | Attending: Hematology | Admitting: Hematology

## 2020-01-24 ENCOUNTER — Other Ambulatory Visit: Payer: Self-pay

## 2020-01-24 VITALS — BP 131/90 | HR 84 | Temp 97.6°F | Resp 18 | Ht 75.0 in | Wt 229.9 lb

## 2020-01-24 DIAGNOSIS — C187 Malignant neoplasm of sigmoid colon: Secondary | ICD-10-CM | POA: Diagnosis present

## 2020-01-24 DIAGNOSIS — G930 Cerebral cysts: Secondary | ICD-10-CM

## 2020-01-24 DIAGNOSIS — I10 Essential (primary) hypertension: Secondary | ICD-10-CM

## 2020-01-24 DIAGNOSIS — R42 Dizziness and giddiness: Secondary | ICD-10-CM | POA: Diagnosis not present

## 2020-01-24 DIAGNOSIS — R5383 Other fatigue: Secondary | ICD-10-CM | POA: Diagnosis not present

## 2020-01-24 DIAGNOSIS — R519 Headache, unspecified: Secondary | ICD-10-CM | POA: Diagnosis not present

## 2020-01-24 DIAGNOSIS — Z95828 Presence of other vascular implants and grafts: Secondary | ICD-10-CM

## 2020-01-24 LAB — CMP (CANCER CENTER ONLY)
ALT: 18 U/L (ref 0–44)
AST: 19 U/L (ref 15–41)
Albumin: 4.3 g/dL (ref 3.5–5.0)
Alkaline Phosphatase: 67 U/L (ref 38–126)
Anion gap: 8 (ref 5–15)
BUN: 11 mg/dL (ref 6–20)
CO2: 28 mmol/L (ref 22–32)
Calcium: 8.8 mg/dL — ABNORMAL LOW (ref 8.9–10.3)
Chloride: 104 mmol/L (ref 98–111)
Creatinine: 1.12 mg/dL (ref 0.61–1.24)
GFR, Est AFR Am: >60 mL/min (ref >60)
GFR, Estimated: >60 mL/min (ref >60)
Glucose, Bld: 93 mg/dL (ref 70–99)
Potassium: 3.8 mmol/L (ref 3.5–5.1)
Sodium: 140 mmol/L (ref 135–145)
Total Bilirubin: 0.5 mg/dL (ref 0.3–1.2)
Total Protein: 7.2 g/dL (ref 6.5–8.1)

## 2020-01-24 LAB — CBC WITH DIFFERENTIAL (CANCER CENTER ONLY)
Abs Immature Granulocytes: 0 10*3/uL (ref 0.00–0.07)
Basophils Absolute: 0 10*3/uL (ref 0.0–0.1)
Basophils Relative: 1 %
Eosinophils Absolute: 0 10*3/uL (ref 0.0–0.5)
Eosinophils Relative: 0 %
HCT: 41.6 % (ref 39.0–52.0)
Hemoglobin: 14.5 g/dL (ref 13.0–17.0)
Immature Granulocytes: 0 %
Lymphocytes Relative: 24 %
Lymphs Abs: 1.2 10*3/uL (ref 0.7–4.0)
MCH: 31.5 pg (ref 26.0–34.0)
MCHC: 34.9 g/dL (ref 30.0–36.0)
MCV: 90.4 fL (ref 80.0–100.0)
Monocytes Absolute: 0.4 10*3/uL (ref 0.1–1.0)
Monocytes Relative: 8 %
Neutro Abs: 3.4 10*3/uL (ref 1.7–7.7)
Neutrophils Relative %: 67 %
Platelet Count: 265 10*3/uL (ref 150–400)
RBC: 4.6 MIL/uL (ref 4.22–5.81)
RDW: 11.9 % (ref 11.5–15.5)
WBC Count: 5 10*3/uL (ref 4.0–10.5)
nRBC: 0 % (ref 0.0–0.2)

## 2020-01-24 MED ORDER — HEPARIN SOD (PORK) LOCK FLUSH 100 UNIT/ML IV SOLN
500.0000 [IU] | Freq: Once | INTRAVENOUS | Status: AC
  Administered 2020-01-24: 15:00:00 500 [IU]
  Filled 2020-01-24: qty 5

## 2020-01-24 MED ORDER — SODIUM CHLORIDE 0.9% FLUSH
10.0000 mL | Freq: Once | INTRAVENOUS | Status: AC
  Administered 2020-01-24: 15:00:00 10 mL
  Filled 2020-01-24: qty 10

## 2020-01-24 NOTE — Patient Instructions (Signed)

## 2020-01-25 ENCOUNTER — Encounter: Payer: Self-pay | Admitting: Hematology

## 2020-01-27 ENCOUNTER — Telehealth: Payer: Self-pay | Admitting: Hematology

## 2020-01-27 ENCOUNTER — Telehealth: Payer: Self-pay

## 2020-01-27 LAB — CEA (IN HOUSE-CHCC): CEA (CHCC-In House): 1.54 ng/mL (ref 0.00–5.00)

## 2020-01-27 NOTE — Telephone Encounter (Signed)
I was contacted by the scheduling department the patient was requesting his CEA level.  I called Mr Jacob Hayes and told him it was WNL.  He verbalized understanding.

## 2020-01-27 NOTE — Telephone Encounter (Signed)
Scheduled appt per 3/5 los.  Spoke with pt and he is aware of his appt date and time.

## 2020-03-06 ENCOUNTER — Inpatient Hospital Stay: Payer: Commercial Managed Care - PPO | Attending: Hematology

## 2020-03-06 ENCOUNTER — Other Ambulatory Visit: Payer: Self-pay

## 2020-03-06 DIAGNOSIS — C19 Malignant neoplasm of rectosigmoid junction: Secondary | ICD-10-CM | POA: Insufficient documentation

## 2020-03-06 DIAGNOSIS — Z95828 Presence of other vascular implants and grafts: Secondary | ICD-10-CM

## 2020-03-06 DIAGNOSIS — C187 Malignant neoplasm of sigmoid colon: Secondary | ICD-10-CM

## 2020-03-06 DIAGNOSIS — Z452 Encounter for adjustment and management of vascular access device: Secondary | ICD-10-CM | POA: Diagnosis present

## 2020-03-06 MED ORDER — HEPARIN SOD (PORK) LOCK FLUSH 100 UNIT/ML IV SOLN
500.0000 [IU] | Freq: Once | INTRAVENOUS | Status: AC
  Administered 2020-03-06: 500 [IU]
  Filled 2020-03-06: qty 5

## 2020-03-06 MED ORDER — SODIUM CHLORIDE 0.9% FLUSH
10.0000 mL | Freq: Once | INTRAVENOUS | Status: AC
  Administered 2020-03-06: 10 mL
  Filled 2020-03-06: qty 10

## 2020-04-24 ENCOUNTER — Other Ambulatory Visit: Payer: Self-pay

## 2020-04-24 ENCOUNTER — Inpatient Hospital Stay: Payer: Commercial Managed Care - PPO | Attending: Hematology

## 2020-04-24 DIAGNOSIS — Z452 Encounter for adjustment and management of vascular access device: Secondary | ICD-10-CM | POA: Diagnosis present

## 2020-04-24 DIAGNOSIS — C19 Malignant neoplasm of rectosigmoid junction: Secondary | ICD-10-CM | POA: Diagnosis present

## 2020-04-24 DIAGNOSIS — C187 Malignant neoplasm of sigmoid colon: Secondary | ICD-10-CM

## 2020-04-24 DIAGNOSIS — Z95828 Presence of other vascular implants and grafts: Secondary | ICD-10-CM

## 2020-04-24 MED ORDER — SODIUM CHLORIDE 0.9% FLUSH
10.0000 mL | Freq: Once | INTRAVENOUS | Status: AC
  Administered 2020-04-24: 10 mL
  Filled 2020-04-24: qty 10

## 2020-04-24 MED ORDER — HEPARIN SOD (PORK) LOCK FLUSH 100 UNIT/ML IV SOLN
500.0000 [IU] | Freq: Once | INTRAVENOUS | Status: AC
  Administered 2020-04-24: 500 [IU]
  Filled 2020-04-24: qty 5

## 2020-06-22 NOTE — Progress Notes (Signed)
[place]   Telephone:([phone]) [phone] Fax:(336) [phone]   Clinic Follow up Note   Patient Care Team: Chesley Noon, MD as PCP - General (Family Medicine) [name], MD (Cardiology) [name], MD as Attending Physician (Gastroenterology)  Date of Service:  06/26/2020  CHIEF COMPLAINT: f/u colon cancer  SUMMARY OF ONCOLOGIC HISTORY: Oncology History Overview Note  Cancer Staging Cancer of sigmoid colon [facility]) Staging form: Colon and Rectum, AJCC 8th Edition - Pathologic stage from 08/07/2018: Stage IIIB (pT3, pN1a, cM0) - Signed by Truitt Merle, MD on 10/25/2018     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 07/05/18  IMPRESSION - Tumor in the rectum. Biopsied. - Diverticulosis in the entire examined colon. - Two 5 to 6 mm polyps, removed with a cold snare. Resected and retrieved. - The examination was otherwise normal on direct and retroflexion views.    07/05/2018 Initial Biopsy   Diagnosis 07/05/18 1. Colon, polyp(s), hepatic flexure - TUBULAR ADENOMA (ONE). - NO HIGH GRADE DYSPLASIA OR MALIGNANCY. 2. Rectum, biopsy, mass - INVASIVE MODERATELY DIFFERENTIATED COLORECTAL ADENOCARCINOMA.   07/10/2018 Imaging   CT CAP W Constrast 07/10/18  IMPRESSION: 1. Mass within the rectosigmoid colon is identified compatible with findings from recent colonoscopy. 2. No evidence for nodal metastasis or distant metastatic disease. 3. Left kidney cyst.   07/17/2018 Imaging   MRI PElvis 07/17/18  IMPRESSION: 4.2 cm polypoid lesion along the right lateral aspect of the upper rectum, corresponding to the patient's biopsy-proven rectal adenocarcinoma, as above. Rectal adenocarcinoma T stage:  T1 or T2 Rectal adenocarcinoma N stage:  N0 Distance from tumor to the anal sphincter is 14  cm.   08/07/2018 Initial Diagnosis   Rectosigmoid cancer ([place])   08/07/2018 Surgery   XI ROBOTIC LOW ANTERIOR RESECTION by Dr. Kieth Brightly and Dr. Marcello Moores    08/07/2018 Pathologic Stage   Diagnosis 08/07/18 1. Colon, segmental resection for tumor, rectosigmoid - INVASIVE MODERATELY DIFFERENTIATED COLORECTAL ADENOCARCINOMA, 3.5 CM. - CARCINOMA FOCALLY INVOLVES PERICOLONIC CONNECTIVE TISSUE. - METASTATIC CARCINOMA IN ONE OF FIFTEEN LYMPH NODES (1/15). - MARGINS NOT INVOLVED. 2. Colon, resection margin (donut), distal ring, sigmoid - BENIGN COLON. - NO EVIDENCE OF MALIGNANCY.   08/07/2018 Cancer Staging   Staging form: Colon and Rectum, AJCC 8th Edition - Pathologic stage from 08/07/2018: Stage IIIB (pT3, pN1a, cM0) - Signed by Truitt Merle, MD on 10/25/2018   08/29/2018 -  Chemotherapy   Adjuvant CAPOX every 3 weeks with Xeloda 2069m twice daily 2 weeks on 1 week off starting 08/29/18. D/c Oxaliplatin after 09/28/18 (cycle 2) due to very poor toleration. Increased to 20022min AM and 250048mn PM starting with cycle 5.  Last 8th cycle dose was taken 02/26/19.    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  The Multi-Gene Panel offered by [facility] includes sequencing and/or deletion duplication testing of the following 84 genes: AIP, ALK, APC, ATM, AXIN2,BAP1,  BARD1, BLM, BMPR1A, BRCA1, BRCA2, BRIP1, CASR, CDC73, CDH1, CDK4, CDKN1B, CDKN1C, CDKN2A (p14ARF), CDKN2A (p16INK4a), CEBPA, CHEK2, CTNNA1, DICER1, DIS3L2, EGFR (c.2369C>T, p.Thr790Met variant only), EPCAM (Deletion/duplication testing only), FH, FLCN, GATA2, GPC3, GREM1 (Promoter region deletion/duplication testing only), HOXB13 (c.251G>A, p.Gly84Glu), HRAS, KIT, MAX, MEN1, MET, MITF (c.952G>A, p.Glu318Lys variant only), MLH1, MSH2, MSH3, MSH6, MUTYH, NBN, NF1, NF2, NTHL1, PALB2, PDGFRA, PHOX2B,  PMS2, POLD1, POLE, POT1, PRKAR1A, PTCH1, PTEN, RAD50, RAD51C, RAD51D, RB1, RECQL4, RET, RUNX1, SDHAF2, SDHA  (sequence changes only), SDHB, SDHC, SDHD, SMAD4, SMARCA4, SMARCB1, SMARCE1, STK11, SUFU, TERC, TERT, TMEM127, TP53, TSC1, TSC2, VHL, WRN and WT1.  The report date is 11/22/2018.   03/20/2019 Imaging   CT CAP 03/20/19  IMPRESSION: 1.  Interval postoperative findings of rectal resection.  2. No definite evidence of metastatic disease in the chest, abdomen, or pelvis.  3. No change in small prominent retroperitoneal lymph nodes. Attention on follow-up.  4. There is a subtle subcapsular hyperdensity of the dome of the liver, likely focal fatty sparing (series 2, image 49). Attention on follow-up.   07/25/2019 Procedure   Colonoscopy by [name] 07/25/19  IMPRESSION - Diverticulosis. Surgical anastomosis at 10 cm. Evidence of neoplasm. - The examination was otherwise normal on direct and retroflexion views. - No specimens collected.   09/20/2019 Imaging   CT AP W Contrast 09/20/19  IMPRESSION: Redemonstrated postoperative findings of rectosigmoid resection and reanastomosis. No evidence of recurrent or metastatic disease in the abdomen or pelvis.   06/23/2020 Imaging   CT CAP W contrast  IMPRESSION: 1. Redemonstrated postoperative findings of rectosigmoid resection and reanastomosis. 2. No evidence of recurrent or metastatic disease in the chest, abdomen, or pelvis.   06/23/2020 Imaging   CT head  IMPRESSION: No evidence of acute intracranial abnormality or intracranial metastatic disease.   1.7 x 2.7 x 3.3 cm posterior fossa arachnoid cyst, unchanged in size as compared to the MRI of 08/29/2013.   Mild ethmoid sinus mucosal thickening.        CURRENT THERAPY:  Surveillance  INTERVAL HISTORY:  Jacob Hayes is here for a follow up of colon cancer. He was last seen by me 5 months ago. He presents to the clinic alone. He notes he is doing well but busy with work. He notes often having motion sickness and nausea easier. This has exacerbated his headaches. He notes low  dose valium at night help him some. He notes his vision and hearing is adequate. He notes his last port flush was $200 and coded as IV treatment.  He notes he received COVID19 vaccines. He continues his COVID19 precautions.    REVIEW OF SYSTEMS:   Constitutional: Denies fevers, chills or abnormal weight loss Eyes: Denies blurriness of vision Ears, nose, mouth, throat, and face: Denies mucositis or sore throat Respiratory: Denies cough, dyspnea or wheezes Cardiovascular: Denies palpitation, chest discomfort or lower extremity swelling Gastrointestinal:  Denies nausea, heartburn or change in bowel habits Skin: Denies abnormal skin rashes Lymphatics: Denies new lymphadenopathy or easy bruising Neurological:Denies numbness, tingling or new weaknesses Behavioral/Psych: Mood is stable, no new changes  All other systems were reviewed with the patient and are negative.   MEDICAL HISTORY:  Past Medical History:  Diagnosis Date  . Allergic rhinitis   . Anxiety   . Anxiety and depression    DENIES   . Bruxism   . Colon cancer ([place])   . Essential hypertension, benign   . Family history of breast cancer   . Family history of melanoma   . GERD   . Hyperlipidemia    Since age 74  . Meniere's disease     SURGICAL HISTORY: Past Surgical History:  Procedure Laterality Date  . BIOPSY N/A 03/20/2013   Procedure: BIOPSY;  Surgeon: [name], MD;  [facility];  Service: Endoscopy;  Laterality: N/A;  Possible small bowel biopsy  . BIOPSY  07/05/2018   Procedure: BIOPSY;  Surgeon: [name], MD;  [facility];  Service: Endoscopy;;  rectal mass  . COLONOSCOPY WITH PROPOFOL N/A 07/05/2018   Procedure: COLONOSCOPY WITH PROPOFOL;  Surgeon: [name], MD;  [facility]: [facility];  Service: Endoscopy;  Laterality: N/A;  8:30am  . COLONOSCOPY WITH PROPOFOL N/A 07/25/2019   Procedure: COLONOSCOPY WITH PROPOFOL;  Surgeon: [name], MD;  [facility];  Service: Endoscopy;  Laterality: N/A;  2:15pm  . ESOPHAGOGASTRODUODENOSCOPY N/A 03/20/2013   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  One slightly nodular.  Small hiatal hernia.  Multiple fundic gland appearing polyps. no evidence of celiac disease, malignancy, h.pylori, barrett's.   . None    . POLYPECTOMY  07/05/2018   Procedure: POLYPECTOMY;  Surgeon: [name], MD;  [facility];  Service: Endoscopy;;  hepatic flexurex2;  . PORTACATH PLACEMENT N/A 08/23/2018   Procedure: INSERTION PORT-A-CATH;  Surgeon: [name], MD;  Location: [facility];  Service: General;  Laterality: N/A;  LMA  . XI ROBOTIC ASSISTED LOWER ANTERIOR RESECTION      I have reviewed the social history and family history with the patient and they are unchanged from previous note.  ALLERGIES:  has No Known Allergies.  MEDICATIONS:  Current Outpatient Medications  Medication Sig Dispense Refill  . acetaminophen (TYLENOL) 500 MG tablet Take 1,000 mg by mouth every 6 (six) hours as needed for moderate pain or headache.     . Ascorbic Acid (VITAMIN C) 1000 MG tablet Take 1,000 mg by mouth daily.    Marland Kitchen b complex vitamins capsule Take 1 capsule by mouth daily.    . cetirizine (ZYRTEC) 10 MG tablet Take 10 mg by mouth daily as needed for allergies.     . diazepam (VALIUM) 5 MG tablet Take 2.5 mg by mouth at bedtime.   1  . diphenhydrAMINE (BENADRYL) 25 MG tablet Take 25-50 mg by mouth 2 (two) times daily as needed for allergies.    . diphenhydramine-acetaminophen (TYLENOL PM) 25-500 MG TABS tablet Take 1 tablet by mouth at bedtime as needed (sleep).    . fluticasone (FLONASE) 50 MCG/ACT nasal spray Place 2 sprays into the nose daily. (Patient taking differently: Place 2 sprays into the nose daily as needed (for seasonal allergies.). ) 16 g 11  . lidocaine-prilocaine (EMLA) cream Apply small amount  over port site and cover with plastic wrap one hour prior to appointment. 30 g 3  . LORazepam (ATIVAN) 1 MG tablet Take 1 mg by mouth every 8 (eight) hours as needed for anxiety.    Marland Kitchen losartan-hydrochlorothiazide (HYZAAR) 100-12.5 MG tablet Take 0.5 tablets by mouth 2 (two) times daily.     . Magnesium 250 MG TABS Take 400 mg by mouth daily.     . methimazole (TAPAZOLE) 5 MG tablet Take 5 mg by mouth daily.     . Multiple Vitamin (MULTIVITAMIN WITH MINERALS) TABS tablet Take 1 tablet by mouth daily. Men's One-A-Day    . ondansetron (ZOFRAN) 8 MG tablet Take 1 tablet (8 mg total) by mouth every 8 (eight) hours as needed for nausea or vomiting. 30 tablet 1  . pantoprazole (PROTONIX) 40 MG tablet TAKE ONE TABLET BY MOUTH TWICE DAILY BEFORE A MEAL (Patient taking differently: Take 40 mg by mouth daily. ) 60 tablet 0  . prochlorperazine (COMPAZINE) 10 MG tablet Take 10  mg by mouth every 6 (six) hours as needed for nausea or vomiting.    . simvastatin (ZOCOR) 40 MG tablet Take 40 mg by mouth every evening.      No current facility-administered medications for this visit.    PHYSICAL EXAMINATION: ECOG PERFORMANCE STATUS: 0 - Asymptomatic  Vitals:   06/26/20 1552  BP: 132/84  Pulse: 93  Resp: 18  Temp: 98.4 F (36.9 C)  SpO2: 98%   Filed Weights   06/26/20 1552  Weight: 229 lb (103.9 kg)    GENERAL:alert, no distress and comfortable SKIN: skin color, texture, turgor are normal, no rashes or significant lesions EYES: normal, Conjunctiva are pink and non-injected, sclera clear Musculoskeletal:no cyanosis of digits and no clubbing  NEURO: alert & oriented x 3 with fluent speech, no focal motor/sensory deficits  LABORATORY DATA:  I have reviewed the data as listed CBC Latest Ref Rng & Units 06/23/2020 01/24/2020 09/20/2019  WBC 4.0 - 10.5 K/uL 4.2 5.0 4.1  Hemoglobin 13.0 - 17.0 g/dL 15.4 14.5 14.6  Hematocrit 39 - 52 % 43.8 41.6 42.4  Platelets 150 - 400 K/uL 246 265 235     CMP  Latest Ref Rng & Units 06/23/2020 01/24/2020 09/20/2019  Glucose 70 - 99 mg/dL 91 93 86  BUN 6 - 20 mg/dL _0 Creatinine 0.61 - 1.24 mg/dL 1.03 1.12 1.00  Sodium 135 - 145 mmol/L 137 140 136  Potassium 3.5 - 5.1 mmol/L 3.9 3.8 3.8  Chloride 98 - 111 mmol/L 104 104 104  CO2 22 - 32 mmol/L _1 Calcium 8.9 - 10.3 mg/dL 9.6 8.8(L) 8.5(L)  Total Protein 6.5 - 8.1 g/dL 7.5 7.2 6.8  Total Bilirubin 0.3 - 1.2 mg/dL 0.6 0.5 0.6  Alkaline Phos 38 - 126 U/L 74 67 73  AST 15 - 41 U/L _2 ALT 0 - 44 U/L _3 RADIOGRAPHIC STUDIES: I have personally reviewed the radiological images as listed and agreed with the findings in the report. No results found.   ASSESSMENT & PLAN:  Jacob Hayes is a 39 y.o. male with    1. Rectosigmoid Cancer,Stage IIIB(pT3N1M0), MMR normal -He was diagnosed in 06/2018.He is s/p anterior resectionwith clear margins.Due to the tumor location mainly in sigmoid colon, radiation was not recommended. -Due to the high risk of recurrence, he startedadjuvant chemotherapy withCAPOX. However he toleratedOxaliplatinpoorly and was stopped after 2 cycles. He was able to complete 8 cycles Xeloda(including CAPOX). -Hisrepeat Colonoscopyfrom 07/25/19 which was benign. Repeat in 2023. Continue Surveillance.  -I personally reviewed and discussed his CT CAP from 06/23/20 which shows no evidence of recurrent or metastatic disease.  -He is clinically doing well. Labs reviewed from this week, CBC and CMP WNL. CEA is normal.  -He is 2 years since his cancer diagnosis. The further away he is from diagnosis the lower risk of recurrence. Plan for last scan in summer 2022. He is fine to have Port removed, I will send message to Dr. Marcello Moores -F/u in 4 months then every 6 months   2. HTN -On Losartan and HCTZ -BP controlled  -Continue to f/u with Dr. Domenic Polite.  3. Genetic TestingNegative  4. Fatigue  -He has fatigue as he is not as active since  cancer diagnosis.  -I recommend he increase his exercise as long as he is not limited.  -He notes he has energy to go but will tire faster now. He is still  active and goes to gym.   5. Arachnoid cyst, headaches, dizziness -He notes he has an arachnoid cyst in 2014 seen on CT head. This was worked up at [facility].   -He notes nausea and easily gets motion sickness even when still and his headaches continue to worsen. This is effecting his daily life.  -He also notes he has Mnire's disease, on Valium for this at night -His CT Head from 06/23/20 shows stable cyst and no evidence of acute intracranial abnormality or intracranial metastatic disease.  -I discussed the option of reviewing his case in Neurology Tumor Board. He is agreeable. I also discussed consulting with a neurologist to further evaluate, he is open to this.     PLAN: -CT CAP reviewed, NED -Copy note to [name] for [facility] removal  -Lab and F/u in 4 months  -will present his case in neuro tumor board    No problem-specific Assessment & Plan notes found for this encounter.   No orders of the defined types were placed in this encounter.  All questions were answered. The patient knows to call the clinic with any problems, questions or concerns. No barriers to learning was detected. The total time spent in the appointment was 30 minutes.     Truitt Merle, MD 06/26/2020   I, Joslyn Devon, am acting as scribe for Truitt Merle, MD.   I have reviewed the above documentation for accuracy and completeness, and I agree with the above.

## 2020-06-23 ENCOUNTER — Ambulatory Visit (HOSPITAL_COMMUNITY)
Admission: RE | Admit: 2020-06-23 | Discharge: 2020-06-23 | Disposition: A | Discharge status: Home / Self Care | Payer: Commercial Managed Care - PPO | Source: Ambulatory Visit | Attending: Hematology | Admitting: Hematology

## 2020-06-23 ENCOUNTER — Inpatient Hospital Stay: Payer: Commercial Managed Care - PPO

## 2020-06-23 ENCOUNTER — Inpatient Hospital Stay: Payer: Commercial Managed Care - PPO | Attending: Hematology

## 2020-06-23 ENCOUNTER — Encounter (HOSPITAL_COMMUNITY): Payer: Self-pay

## 2020-06-23 ENCOUNTER — Other Ambulatory Visit: Payer: Self-pay

## 2020-06-23 DIAGNOSIS — R42 Dizziness and giddiness: Secondary | ICD-10-CM | POA: Insufficient documentation

## 2020-06-23 DIAGNOSIS — G930 Cerebral cysts: Secondary | ICD-10-CM | POA: Diagnosis present

## 2020-06-23 DIAGNOSIS — C187 Malignant neoplasm of sigmoid colon: Secondary | ICD-10-CM | POA: Insufficient documentation

## 2020-06-23 DIAGNOSIS — I10 Essential (primary) hypertension: Secondary | ICD-10-CM | POA: Diagnosis not present

## 2020-06-23 DIAGNOSIS — R519 Headache, unspecified: Secondary | ICD-10-CM | POA: Insufficient documentation

## 2020-06-23 DIAGNOSIS — H8109 Meniere's disease, unspecified ear: Secondary | ICD-10-CM | POA: Insufficient documentation

## 2020-06-23 DIAGNOSIS — R110 Nausea: Secondary | ICD-10-CM | POA: Diagnosis not present

## 2020-06-23 DIAGNOSIS — C19 Malignant neoplasm of rectosigmoid junction: Secondary | ICD-10-CM | POA: Diagnosis present

## 2020-06-23 DIAGNOSIS — R5383 Other fatigue: Secondary | ICD-10-CM | POA: Insufficient documentation

## 2020-06-23 DIAGNOSIS — G43009 Migraine without aura, not intractable, without status migrainosus: Secondary | ICD-10-CM | POA: Insufficient documentation

## 2020-06-23 DIAGNOSIS — Z95828 Presence of other vascular implants and grafts: Secondary | ICD-10-CM

## 2020-06-23 LAB — CBC WITH DIFFERENTIAL (CANCER CENTER ONLY)
Abs Immature Granulocytes: 0.01 10*3/uL (ref 0.00–0.07)
Basophils Absolute: 0.1 10*3/uL (ref 0.0–0.1)
Basophils Relative: 1 %
Eosinophils Absolute: 0.2 10*3/uL (ref 0.0–0.5)
Eosinophils Relative: 4 %
HCT: 43.8 % (ref 39.0–52.0)
Hemoglobin: 15.4 g/dL (ref 13.0–17.0)
Immature Granulocytes: 0 %
Lymphocytes Relative: 28 %
Lymphs Abs: 1.2 10*3/uL (ref 0.7–4.0)
MCH: 31 pg (ref 26.0–34.0)
MCHC: 35.2 g/dL (ref 30.0–36.0)
MCV: 88.1 fL (ref 80.0–100.0)
Monocytes Absolute: 0.4 10*3/uL (ref 0.1–1.0)
Monocytes Relative: 10 %
Neutro Abs: 2.4 10*3/uL (ref 1.7–7.7)
Neutrophils Relative %: 57 %
Platelet Count: 246 10*3/uL (ref 150–400)
RBC: 4.97 MIL/uL (ref 4.22–5.81)
RDW: 11.8 % (ref 11.5–15.5)
WBC Count: 4.2 10*3/uL (ref 4.0–10.5)
nRBC: 0 % (ref 0.0–0.2)

## 2020-06-23 LAB — CEA (IN HOUSE-CHCC): CEA (CHCC-In House): 1.03 ng/mL (ref 0.00–5.00)

## 2020-06-23 LAB — CMP (CANCER CENTER ONLY)
ALT: 20 U/L (ref 0–44)
AST: 21 U/L (ref 15–41)
Albumin: 4.2 g/dL (ref 3.5–5.0)
Alkaline Phosphatase: 74 U/L (ref 38–126)
Anion gap: 7 (ref 5–15)
BUN: 12 mg/dL (ref 6–20)
CO2: 26 mmol/L (ref 22–32)
Calcium: 9.6 mg/dL (ref 8.9–10.3)
Chloride: 104 mmol/L (ref 98–111)
Creatinine: 1.03 mg/dL (ref 0.61–1.24)
GFR, Est AFR Am: >60 mL/min (ref >60)
GFR, Estimated: >60 mL/min (ref >60)
Glucose, Bld: 91 mg/dL (ref 70–99)
Potassium: 3.9 mmol/L (ref 3.5–5.1)
Sodium: 137 mmol/L (ref 135–145)
Total Bilirubin: 0.6 mg/dL (ref 0.3–1.2)
Total Protein: 7.5 g/dL (ref 6.5–8.1)

## 2020-06-23 MED ORDER — SODIUM CHLORIDE (PF) 0.9 % IJ SOLN
INTRAMUSCULAR | Status: AC
  Filled 2020-06-23: qty 50

## 2020-06-23 MED ORDER — HEPARIN SOD (PORK) LOCK FLUSH 100 UNIT/ML IV SOLN
500.0000 [IU] | Freq: Once | INTRAVENOUS | Status: AC
  Administered 2020-06-23: 500 [IU] via INTRAVENOUS

## 2020-06-23 MED ORDER — SODIUM CHLORIDE 0.9% FLUSH
10.0000 mL | Freq: Once | INTRAVENOUS | Status: AC
  Administered 2020-06-23: 10 mL
  Filled 2020-06-23: qty 10

## 2020-06-23 MED ORDER — IOHEXOL 300 MG/ML  SOLN
100.0000 mL | Freq: Once | INTRAMUSCULAR | Status: AC | PRN
  Administered 2020-06-23: 100 mL via INTRAVENOUS

## 2020-06-23 MED ORDER — HEPARIN SOD (PORK) LOCK FLUSH 100 UNIT/ML IV SOLN
INTRAVENOUS | Status: AC
  Filled 2020-06-23: qty 5

## 2020-06-26 ENCOUNTER — Other Ambulatory Visit: Payer: Self-pay

## 2020-06-26 ENCOUNTER — Telehealth: Payer: Self-pay | Admitting: Hematology

## 2020-06-26 ENCOUNTER — Inpatient Hospital Stay (HOSPITAL_BASED_OUTPATIENT_CLINIC_OR_DEPARTMENT_OTHER): Payer: Commercial Managed Care - PPO | Admitting: Hematology

## 2020-06-26 VITALS — BP 132/84 | HR 93 | Temp 98.4°F | Resp 18 | Ht 75.0 in | Wt 229.0 lb

## 2020-06-26 DIAGNOSIS — C187 Malignant neoplasm of sigmoid colon: Secondary | ICD-10-CM

## 2020-06-26 DIAGNOSIS — I10 Essential (primary) hypertension: Secondary | ICD-10-CM

## 2020-06-26 DIAGNOSIS — C19 Malignant neoplasm of rectosigmoid junction: Secondary | ICD-10-CM | POA: Diagnosis not present

## 2020-06-26 NOTE — Telephone Encounter (Signed)
Scheduled per 08/06 los, patient is notified.

## 2020-06-28 ENCOUNTER — Encounter: Payer: Self-pay | Admitting: Hematology

## 2020-06-29 ENCOUNTER — Ambulatory Visit: Payer: Self-pay | Admitting: General Surgery

## 2020-06-30 ENCOUNTER — Telehealth: Payer: Self-pay | Admitting: *Deleted

## 2020-06-30 NOTE — Telephone Encounter (Signed)
LM for patient that Dr Burr Medico has referred him to Dr Mickeal Skinner. Does not feel that surgery is an option.

## 2020-07-09 ENCOUNTER — Other Ambulatory Visit: Payer: Self-pay

## 2020-07-09 ENCOUNTER — Ambulatory Visit: Payer: Commercial Managed Care - PPO | Admitting: Internal Medicine

## 2020-07-09 ENCOUNTER — Inpatient Hospital Stay (HOSPITAL_BASED_OUTPATIENT_CLINIC_OR_DEPARTMENT_OTHER): Payer: Commercial Managed Care - PPO | Admitting: Internal Medicine

## 2020-07-09 VITALS — BP 141/84 | HR 82 | Temp 97.8°F | Resp 18 | Ht 75.0 in | Wt 227.5 lb

## 2020-07-09 DIAGNOSIS — C19 Malignant neoplasm of rectosigmoid junction: Secondary | ICD-10-CM | POA: Diagnosis not present

## 2020-07-09 DIAGNOSIS — R42 Dizziness and giddiness: Secondary | ICD-10-CM | POA: Diagnosis not present

## 2020-07-09 DIAGNOSIS — G930 Cerebral cysts: Secondary | ICD-10-CM | POA: Diagnosis not present

## 2020-07-09 DIAGNOSIS — G43009 Migraine without aura, not intractable, without status migrainosus: Secondary | ICD-10-CM

## 2020-07-10 ENCOUNTER — Telehealth: Payer: Self-pay | Admitting: Internal Medicine

## 2020-07-10 DIAGNOSIS — G930 Cerebral cysts: Secondary | ICD-10-CM | POA: Insufficient documentation

## 2020-07-10 NOTE — Progress Notes (Signed)
[facility] [place], [place] [address] [phone]   New Patient Evaluation  Date of Service: 07/10/20 Patient Name: Jacob Hayes Patient MRN: [id number] Patient DOB: [birth year] Provider: Ventura Sellers, MD  Identifying Statement:  Jacob Hayes is a 39 y.o. male with Migraine without aura and without status migrainosus, not intractable [G43.009] who presents for initial consultation and evaluation regarding cancer associated neurologic deficits.    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 07/05/18  IMPRESSION - Tumor in the rectum. Biopsied. - Diverticulosis in the entire examined colon. - Two 5 to 6 mm polyps, removed with a cold snare. Resected and retrieved. - The examination was otherwise normal on direct and retroflexion views.    07/05/2018 Initial Biopsy   Diagnosis 07/05/18 1. Colon, polyp(s), hepatic flexure - TUBULAR ADENOMA (ONE). - NO HIGH GRADE DYSPLASIA OR MALIGNANCY. 2. Rectum, biopsy, mass - INVASIVE MODERATELY DIFFERENTIATED COLORECTAL ADENOCARCINOMA.   07/10/2018 Imaging   CT CAP W Constrast 07/10/18  IMPRESSION: 1. Mass within the rectosigmoid colon is identified compatible with findings from recent colonoscopy. 2. No evidence for nodal metastasis or distant metastatic disease. 3. Left kidney cyst.   07/17/2018 Imaging   MRI PElvis 07/17/18   IMPRESSION: 4.2 cm polypoid lesion along the right lateral aspect of the upper rectum, corresponding to the patient's biopsy-proven rectal adenocarcinoma, as above. Rectal adenocarcinoma T stage:  T1 or T2 Rectal adenocarcinoma N stage:  N0 Distance from tumor to the anal sphincter is 14 cm.   08/07/2018 Initial Diagnosis   Rectosigmoid cancer ([place])   08/07/2018 Surgery   XI ROBOTIC LOW ANTERIOR RESECTION by Dr. Kieth Brightly and Dr. Marcello Moores    08/07/2018 Pathologic Stage   Diagnosis 08/07/18 1. Colon, segmental resection for tumor, rectosigmoid - INVASIVE MODERATELY DIFFERENTIATED COLORECTAL ADENOCARCINOMA, 3.5 CM. - CARCINOMA FOCALLY INVOLVES PERICOLONIC CONNECTIVE TISSUE. - METASTATIC CARCINOMA IN ONE OF FIFTEEN LYMPH NODES (1/15). - MARGINS NOT INVOLVED. 2. Colon, resection margin (donut), distal ring, sigmoid - BENIGN COLON. - NO EVIDENCE OF MALIGNANCY.   08/07/2018 Cancer Staging   Staging form: Colon and Rectum, AJCC 8th Edition - Pathologic stage from 08/07/2018: Stage IIIB (pT3, pN1a, cM0) - Signed by Truitt Merle, MD on 10/25/2018   08/29/2018 -  Chemotherapy   Adjuvant CAPOX every 3 weeks with Xeloda 2047m twice daily 2 weeks on 1 week off starting 08/29/18. D/c Oxaliplatin after 09/28/18 (cycle 2) due to very poor toleration. Increased to 20041min AM and 250053mn PM starting with cycle 5.  Last 8th cycle dose was taken 02/26/19.    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  The Multi-Gene Panel offered by [facility] includes sequencing and/or deletion duplication testing of the following 84 genes: AIP, ALK, APC, ATM, AXIN2,BAP1,  BARD1, BLM, BMPR1A, BRCA1, BRCA2, BRIP1, CASR, CDC73, CDH1, CDK4, CDKN1B, CDKN1C, CDKN2A (p14ARF), CDKN2A (p16INK4a), CEBPA, CHEK2, CTNNA1, DICER1, DIS3L2, EGFR (c.2369C>T, p.Thr790Met  variant only), EPCAM (Deletion/duplication testing only), FH, FLCN, GATA2, GPC3, GREM1 (Promoter region deletion/duplication  testing only), HOXB13 (c.251G>A, p.Gly84Glu), HRAS, KIT, MAX, MEN1, MET, MITF (c.952G>A, p.Glu318Lys variant only), MLH1, MSH2, MSH3, MSH6, MUTYH, NBN, NF1, NF2, NTHL1, PALB2, PDGFRA, PHOX2B, PMS2, POLD1, POLE, POT1, PRKAR1A, PTCH1, PTEN, RAD50, RAD51C, RAD51D, RB1, RECQL4, RET, RUNX1, SDHAF2, SDHA (sequence changes only), SDHB, SDHC, SDHD, SMAD4, SMARCA4, SMARCB1, SMARCE1, STK11, SUFU, TERC, TERT, TMEM127, TP53, TSC1, TSC2, VHL, WRN and WT1.  The report date is 11/22/2018.   03/20/2019 Imaging   CT CAP 03/20/19  IMPRESSION: 1.  Interval postoperative findings of rectal resection.  2. No definite evidence of metastatic disease in the chest, abdomen, or pelvis.  3. No change in small prominent retroperitoneal lymph nodes. Attention on follow-up.  4. There is a subtle subcapsular hyperdensity of the dome of the liver, likely focal fatty sparing (series 2, image 49). Attention on follow-up.   07/25/2019 Procedure   Colonoscopy by [name] 07/25/19  IMPRESSION - Diverticulosis. Surgical anastomosis at 10 cm. Evidence of neoplasm. - The examination was otherwise normal on direct and retroflexion views. - No specimens collected.   09/20/2019 Imaging   CT AP W Contrast 09/20/19  IMPRESSION: Redemonstrated postoperative findings of rectosigmoid resection and reanastomosis. No evidence of recurrent or metastatic disease in the abdomen or pelvis.   06/23/2020 Imaging   CT CAP W contrast  IMPRESSION: 1. Redemonstrated postoperative findings of rectosigmoid resection and reanastomosis. 2. No evidence of recurrent or metastatic disease in the chest, abdomen, or pelvis.   06/23/2020 Imaging   CT head  IMPRESSION: No evidence of acute intracranial abnormality or intracranial metastatic disease.   1.7 x 2.7 x 3.3 cm posterior fossa arachnoid cyst, unchanged in size as compared to the MRI of 08/29/2013.   Mild ethmoid sinus mucosal thickening.       History of Present Illness: The  patient's records from the referring physician were obtained and reviewed and the patient interviewed to confirm this HPI.  Jacob Hayes presents today to review neurologic complaints.  He describes 10 years history of headaches, described as "pain starting in the middle of back of neck, radiating up to back of the head".  This may be preceded by some neck stiffness.  This then evolves into a several hours long full migraine syndrome, with nausea, photophobia.  Frequency and severity increased when he started chemotherapy in 2019 and developed severe neuropathic symptoms primarily in his hands.  This has mostly resolved, although he does feel some fatigue and aching in his hands after a full day of work, increased from prior to cancer treatment.  For headaches he typically doses Aleve but not until several hours of pain.  He also describes moderate waxing and waning vertiginous symptoms that have been long standing and chronic.  Vertigo is described as "feeling like I'm on a ship" rather than room-spinning type. This has been worked up extensively including at [facility], he currently doses Valium 2.12m HS for clinical suppression of vertigo symptoms.  Recently underwent brain MRI ordered by Dr. FBurr Medicowhich demonstrated cyst in posterior fossa.  Medications: Current Outpatient Medications on File Prior to Visit  Medication Sig Dispense Refill  . diazepam (VALIUM) 5 MG tablet Take 2.5 mg by mouth at bedtime.   1  . acetaminophen (TYLENOL) 500 MG tablet Take 1,000 mg by mouth every 6 (six) hours as needed for moderate pain or headache.     . Ascorbic Acid (VITAMIN C) 1000 MG tablet Take  1,000 mg by mouth daily.    Marland Kitchen b complex vitamins capsule Take 1 capsule by mouth daily.    . cetirizine (ZYRTEC) 10 MG tablet Take 10 mg by mouth daily as needed for allergies.     . diphenhydrAMINE (BENADRYL) 25 MG tablet Take 25-50 mg by mouth 2 (two) times daily as needed for allergies.    . diphenhydramine-acetaminophen  (TYLENOL PM) 25-500 MG TABS tablet Take 1 tablet by mouth at bedtime as needed (sleep).    . fluticasone (FLONASE) 50 MCG/ACT nasal spray Place 2 sprays into the nose daily. (Patient taking differently: Place 2 sprays into the nose daily as needed (for seasonal allergies.). ) 16 g 11  . lidocaine-prilocaine (EMLA) cream Apply small amount over port site and cover with plastic wrap one hour prior to appointment. 30 g 3  . LORazepam (ATIVAN) 1 MG tablet Take 1 mg by mouth every 8 (eight) hours as needed for anxiety.    Marland Kitchen losartan-hydrochlorothiazide (HYZAAR) 100-12.5 MG tablet Take 0.5 tablets by mouth 2 (two) times daily.     . Magnesium 250 MG TABS Take 400 mg by mouth daily.     . methimazole (TAPAZOLE) 5 MG tablet Take 5 mg by mouth daily.     . Multiple Vitamin (MULTIVITAMIN WITH MINERALS) TABS tablet Take 1 tablet by mouth daily. Men's One-A-Day    . ondansetron (ZOFRAN) 8 MG tablet Take 1 tablet (8 mg total) by mouth every 8 (eight) hours as needed for nausea or vomiting. 30 tablet 1  . pantoprazole (PROTONIX) 40 MG tablet TAKE ONE TABLET BY MOUTH TWICE DAILY BEFORE A MEAL (Patient taking differently: Take 40 mg by mouth daily. ) 60 tablet 0  . prochlorperazine (COMPAZINE) 10 MG tablet Take 10 mg by mouth every 6 (six) hours as needed for nausea or vomiting.    . simvastatin (ZOCOR) 40 MG tablet Take 40 mg by mouth every evening.      No current facility-administered medications on file prior to visit.    Allergies: No Known Allergies Past Medical History:  Past Medical History:  Diagnosis Date  . Allergic rhinitis   . Anxiety   . Anxiety and depression    DENIES   . Bruxism   . Colon cancer ([place])   . Essential hypertension, benign   . Family history of breast cancer   . Family history of melanoma   . GERD   . Hyperlipidemia    Since age 53  . Meniere's disease    Past Surgical History:  Past Surgical History:  Procedure Laterality Date  . BIOPSY N/A 03/20/2013   Procedure:  BIOPSY;  Surgeon: [name], MD;  [facility];  Service: Endoscopy;  Laterality: N/A;  Possible small bowel biopsy  . BIOPSY  07/05/2018   Procedure: BIOPSY;  Surgeon: [name], MD;  [facility];  Service: Endoscopy;;  rectal mass  . COLONOSCOPY WITH PROPOFOL N/A 07/05/2018   Procedure: COLONOSCOPY WITH PROPOFOL;  Surgeon: [name], MD;  [facility];  Service: Endoscopy;  Laterality: N/A;  8:30am  . COLONOSCOPY WITH PROPOFOL N/A 07/25/2019   Procedure: COLONOSCOPY WITH PROPOFOL;  Surgeon: [name], MD;  [facility];  Service: Endoscopy;  Laterality: N/A;  2:15pm  . ESOPHAGOGASTRODUODENOSCOPY N/A 03/20/2013   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  One slightly nodular.  Small hiatal hernia.  Multiple fundic gland appearing polyps. no evidence of celiac disease, malignancy, h.pylori, barrett's.   . None    . POLYPECTOMY  07/05/2018   Procedure: POLYPECTOMY;  Surgeon: [name], MD;  [facility];  Service: Endoscopy;;  hepatic flexurex2;  . PORTACATH PLACEMENT N/A 08/23/2018   Procedure: INSERTION PORT-A-CATH;  Surgeon: [name], MD;  Location: [facility];  Service: General;  Laterality: N/A;  LMA  . XI ROBOTIC ASSISTED LOWER ANTERIOR RESECTION     Social History:  Social History   Socioeconomic History  . Marital status: Divorced    Spouse name: Not on file  . Number of children: 2  . Years of education: Not on file  . Highest education level: Not on file  Occupational History  . Occupation: works with Sales executive at [employer]  . Smoking status: Never Smoker  . Smokeless tobacco: Never Used  Vaping Use  . Vaping Use: Never used  Substance and Sexual Activity  . Alcohol use: No  . Drug use: No  . Sexual activity: Yes  Other Topics Concern  . Not on file  Social History  Narrative   Unable to ask intimate partner questions.   Social Determinants of Health   Financial Resource Strain:   . Difficulty of Paying Living Expenses: Not on file  Food Insecurity:   . Worried About Charity fundraiser in the Last Year: Not on file  . Ran Out of Food in the Last Year: Not on file  Transportation Needs:   . Lack of Transportation (Medical): Not on file  . Lack of Transportation (Non-Medical): Not on file  Physical Activity:   . Days of Exercise per Week: Not on file  . Minutes of Exercise per Session: Not on file  Stress:   . Feeling of Stress : Not on file  Social Connections:   . Frequency of Communication with Friends and Family: Not on file  . Frequency of Social Gatherings with Friends and Family: Not on file  . Attends Religious Services: Not on file  . Active Member of Clubs or Organizations: Not on file  . Attends Archivist Meetings: Not on file  . Marital Status: Not on file  Intimate Partner Violence:   . Fear of Current or Ex-Partner: Not on file  . Emotionally Abused: Not on file  . Physically Abused: Not on file  . Sexually Abused: Not on file   Family History:  Family History  Problem Relation Age of Onset  . Breast cancer Mother 85  . Thyroid disease Mother        Thyroid removed  . Lung cancer Mother   . Crohn's disease Mother        ???  . Heart disease Father   . Stroke Father 24  . Hyperlipidemia Father   . AAA (abdominal aortic aneurysm) Father   . Valvular heart disease Half-Brother 34       Bicuspid aortic valve  . Lung cancer Maternal Aunt   . Heart disease Paternal Grandfather 33       d. 49  . Other Half-Sister        lump in her breastw  . Melanoma Nephew        pat 1/2 sisters' son dx 5x  . Colon cancer Neg Hx     Review of Systems: Constitutional: Doesn't report fevers, chills or abnormal weight loss Eyes: Doesn't report blurriness of vision Ears, nose, mouth, throat, and face:  Doesn't report sore  throat Respiratory: Doesn't report cough, dyspnea or wheezes Cardiovascular: Doesn't report palpitation, chest discomfort  Gastrointestinal:  Doesn't report nausea, constipation, diarrhea GU: Doesn't report incontinence Skin: Doesn't report skin rashes Neurological: Per HPI Musculoskeletal: Doesn't report joint pain Behavioral/Psych: Doesn't report anxiety  Physical Exam: Vitals:   07/09/20 0922  BP: (!) 141/84  Pulse: 82  Resp: 18  Temp: 97.8 F (36.6 C)  SpO2: 100%   KPS: 90. General: Alert, cooperative, pleasant, in no acute distress Head: Normal EENT: No conjunctival injection or scleral icterus.  Lungs: Resp effort normal Cardiac: Regular rate Abdomen: Non-distended abdomen Skin: No rashes cyanosis or petechiae. Extremities: No clubbing or edema  Neurologic Exam: Mental Status: Awake, alert, attentive to examiner. Oriented to self and environment. Language is fluent with intact comprehension.  Cranial Nerves: Visual acuity is grossly normal. Visual fields are full. Extra-ocular movements intact. No ptosis. Face is symmetric Motor: Tone and bulk are normal. Power is full in both arms and legs. Reflexes are symmetric, no pathologic reflexes present.  Sensory: Intact to light touch Gait: Normal.   Labs: I have reviewed the data as listed    Component Value Date/Time   NA 137 06/23/2020 0813   K 3.9 06/23/2020 0813   CL 104 06/23/2020 0813   CO2 26 06/23/2020 0813   GLUCOSE 91 06/23/2020 0813   BUN 12 06/23/2020 0813   CREATININE 1.03 06/23/2020 0813   CALCIUM 9.6 06/23/2020 0813   PROT 7.5 06/23/2020 0813   ALBUMIN 4.2 06/23/2020 0813   AST 21 06/23/2020 0813   ALT 20 06/23/2020 0813   ALKPHOS 74 06/23/2020 0813   BILITOT 0.6 06/23/2020 0813   GFRNONAA >60 06/23/2020 0813   GFRAA >60 06/23/2020 0813   Lab Results  Component Value Date   WBC 4.2 06/23/2020   NEUTROABS 2.4 06/23/2020   HGB 15.4 06/23/2020   HCT 43.8 06/23/2020   MCV 88.1 06/23/2020    PLT 246 06/23/2020    Imaging:  CT Head W Wo Contrast  Result Date: 06/23/2020 CLINICAL DATA:  Arachnoid cyst. Additional provided: Stage 3 colorectal cancer with prior partial colectomy and chemotherapy, headaches and dizziness. EXAM: CT HEAD WITHOUT AND WITH CONTRAST TECHNIQUE: Contiguous axial images were obtained from the base of the skull through the vertex without and with intravenous contrast CONTRAST:  134m OMNIPAQUE IOHEXOL 300 MG/ML  SOLN COMPARISON:  Brain MRI 08/29/2013 FINDINGS: Brain: Cerebral volume is normal. There is no acute intracranial hemorrhage. No demarcated cortical infarct. No extra-axial fluid collection. No evidence of intracranial mass. No midline shift. No abnormal intracranial enhancement. Unchanged CSF density prominence within the retro cerebellar right posterior fossa, measuring 1.7 x 2.7 x 3.3 cm likely reflecting an arachnoid cyst. Vascular: No hyperdense vessel is demonstrated on pre-contrast imaging. Skull: Normal. Negative for fracture or focal lesion. Sinuses/Orbits: Visualized orbits show no acute finding. Mild ethmoid sinus mucosal thickening. No significant mastoid effusion. IMPRESSION: No evidence of acute intracranial abnormality or intracranial metastatic disease. 1.7 x 2.7 x 3.3 cm posterior fossa arachnoid cyst, unchanged in size as compared to the MRI of 08/29/2013. Mild ethmoid sinus mucosal thickening. Electronically Signed   By: KKellie SimmeringDO   On: 06/23/2020 10:58   CT Chest W Contrast  Result Date: 06/23/2020 CLINICAL DATA:  Colorectal cancer, status post colon resection and chemotherapy EXAM: CT CHEST, ABDOMEN, AND PELVIS WITH CONTRAST TECHNIQUE: Multidetector CT imaging of the chest, abdomen and pelvis was performed following the standard protocol during bolus administration of  intravenous contrast. CONTRAST:  131m OMNIPAQUE IOHEXOL 300 MG/ML SOLN common additional oral enteric contrast COMPARISON:  09/20/2019 FINDINGS: CT CHEST FINDINGS  Cardiovascular: Right chest port catheter normal heart size. No pericardial effusion. Mediastinum/Nodes: No enlarged mediastinal, hilar, or axillary lymph nodes. Thyroid gland, trachea, and esophagus demonstrate no significant findings. Lungs/Pleura: Lungs are clear. No pleural effusion or pneumothorax. Musculoskeletal: No chest wall mass or suspicious bone lesions identified. CT ABDOMEN PELVIS FINDINGS Hepatobiliary: No solid liver abnormality is seen. No gallstones, gallbladder wall thickening, or biliary dilatation. Pancreas: Unremarkable. No pancreatic ductal dilatation or surrounding inflammatory changes. Spleen: Normal in size without significant abnormality. Adrenals/Urinary Tract: Adrenal glands are unremarkable. Kidneys are normal, without renal calculi, solid lesion, or hydronephrosis. Bladder is unremarkable. Stomach/Bowel: Stomach is within normal limits. Appendix appears normal. Redemonstrated postoperative findings of rectosigmoid resection and reanastomosis. Vascular/Lymphatic: No significant vascular findings are present. No enlarged abdominal or pelvic lymph nodes. Reproductive: No mass or other abnormality. Other: No abdominal wall hernia or abnormality. No abdominopelvic ascites. Musculoskeletal: No acute or significant osseous findings. IMPRESSION: 1. Redemonstrated postoperative findings of rectosigmoid resection and reanastomosis. 2. No evidence of recurrent or metastatic disease in the chest, abdomen, or pelvis. Electronically Signed   By: AEddie CandleM.D.   On: 06/23/2020 15:26   CT Abdomen Pelvis W Contrast  Result Date: 06/23/2020 CLINICAL DATA:  Colorectal cancer, status post colon resection and chemotherapy EXAM: CT CHEST, ABDOMEN, AND PELVIS WITH CONTRAST TECHNIQUE: Multidetector CT imaging of the chest, abdomen and pelvis was performed following the standard protocol during bolus administration of intravenous contrast. CONTRAST:  1030mOMNIPAQUE IOHEXOL 300 MG/ML SOLN common  additional oral enteric contrast COMPARISON:  09/20/2019 FINDINGS: CT CHEST FINDINGS Cardiovascular: Right chest port catheter normal heart size. No pericardial effusion. Mediastinum/Nodes: No enlarged mediastinal, hilar, or axillary lymph nodes. Thyroid gland, trachea, and esophagus demonstrate no significant findings. Lungs/Pleura: Lungs are clear. No pleural effusion or pneumothorax. Musculoskeletal: No chest wall mass or suspicious bone lesions identified. CT ABDOMEN PELVIS FINDINGS Hepatobiliary: No solid liver abnormality is seen. No gallstones, gallbladder wall thickening, or biliary dilatation. Pancreas: Unremarkable. No pancreatic ductal dilatation or surrounding inflammatory changes. Spleen: Normal in size without significant abnormality. Adrenals/Urinary Tract: Adrenal glands are unremarkable. Kidneys are normal, without renal calculi, solid lesion, or hydronephrosis. Bladder is unremarkable. Stomach/Bowel: Stomach is within normal limits. Appendix appears normal. Redemonstrated postoperative findings of rectosigmoid resection and reanastomosis. Vascular/Lymphatic: No significant vascular findings are present. No enlarged abdominal or pelvic lymph nodes. Reproductive: No mass or other abnormality. Other: No abdominal wall hernia or abnormality. No abdominopelvic ascites. Musculoskeletal: No acute or significant osseous findings. IMPRESSION: 1. Redemonstrated postoperative findings of rectosigmoid resection and reanastomosis. 2. No evidence of recurrent or metastatic disease in the chest, abdomen, or pelvis. Electronically Signed   By: AlEddie Candle.D.   On: 06/23/2020 15:26     Assessment/Plan Migraine without aura and without status migrainosus, not intractable [G43.009]  JaNaasir Carreiraresents today with a clinical syndrome consistent with evolved migraine secondary to underlying occipital neuralgia.    We recommended trial of greater occipital nerve blockade for headache prophylaxis.  We  will place [facility] for evaluation and treatment.  In addition, we recommended more prompt utilization of analgesia (ok with Aleve or Ibuprofen) at the earliest sign of headache or neck stiffness.  For vertigo, etiology is unclear but appears to localize centrally rather than peripherally.  Recommended referral to physical therapy for focused vestibular rehabiltation.  Somewhat  doubt any relationship with arachnoid cyst which is chronic and stable over 7 year interval per imaging.   We spent twenty additional minutes teaching regarding the natural history, biology, and historical experience in the treatment of neurologic complications of cancer.   We appreciate the opportunity to participate in the care of Jacob Hayes.  He should return to clinic in 2 months for further evaluation following the above workup and intervention.  All questions were answered. The patient knows to call the clinic with any problems, questions or concerns. No barriers to learning were detected.  The total time spent in the encounter was 40 minutes and more than 50% was on counseling and review of test results   Ventura Sellers, MD Medical Director of Neuro-Oncology [facility] at [place] 07/10/20 9:10 AM

## 2020-07-10 NOTE — Telephone Encounter (Signed)
Scheduled appointment per 8/19 los. Patient is aware of appointment date and time.

## 2020-07-16 ENCOUNTER — Other Ambulatory Visit: Payer: Self-pay | Admitting: *Deleted

## 2020-07-16 ENCOUNTER — Telehealth: Payer: Self-pay | Admitting: Internal Medicine

## 2020-07-16 NOTE — Telephone Encounter (Signed)
Rescheduled appt on 10/22 to 10/29. Provider on PAL. Pt is aware of appt time and date.

## 2020-07-21 ENCOUNTER — Other Ambulatory Visit: Payer: Self-pay | Admitting: *Deleted

## 2020-07-21 DIAGNOSIS — R42 Dizziness and giddiness: Secondary | ICD-10-CM

## 2020-07-21 NOTE — Progress Notes (Signed)
[place] to provide vestibular rehab

## 2020-07-28 ENCOUNTER — Telehealth: Payer: Self-pay | Admitting: *Deleted

## 2020-07-28 ENCOUNTER — Other Ambulatory Visit: Payer: Self-pay

## 2020-07-28 ENCOUNTER — Encounter (HOSPITAL_BASED_OUTPATIENT_CLINIC_OR_DEPARTMENT_OTHER): Payer: Self-pay | Admitting: General Surgery

## 2020-07-28 ENCOUNTER — Other Ambulatory Visit (HOSPITAL_COMMUNITY)
Admission: RE | Admit: 2020-07-28 | Discharge: 2020-07-28 | Disposition: A | Discharge status: Home / Self Care | Payer: Commercial Managed Care - PPO | Source: Ambulatory Visit | Attending: General Surgery | Admitting: General Surgery

## 2020-07-28 DIAGNOSIS — Z20822 Contact with and (suspected) exposure to covid-19: Secondary | ICD-10-CM | POA: Insufficient documentation

## 2020-07-28 DIAGNOSIS — Z01812 Encounter for preprocedural laboratory examination: Secondary | ICD-10-CM | POA: Diagnosis not present

## 2020-07-28 NOTE — Telephone Encounter (Signed)
Received update regarding Vestibular rehab order.  It was not processed as patient reported he is receiving this service through an ENT MD.  Order was canceled with [place]

## 2020-07-28 NOTE — Progress Notes (Signed)
Spoke w/ via phone for pre-op interview--- PT Lab needs dos---- Istat and EKG              Lab results------ no COVID test ------ 07-28-2020 @ 1500 Arrive at ------- 0900 NPO after MN NO Solid Food.  Clear liquids from MN until--- 0800 Medications to take morning of surgery ----- Protonix, Methimazole  Diabetic medication ----- n/a Patient Special Instructions ----- n/a Pre-Op special Istructions ----- n/a Patient verbalized understanding of instructions that were given at this phone interview. Patient denies shortness of breath, chest pain, fever, cough at this phone interview.Anesthesia Review:  PCP: Dr Fabian November Oncologist:  Dr Burr Medico Chest x-ray :  CT 06-23-2020 epic EKG : 06-28-2018 epic Echo : 08-03-2012 epic Stress test: ETT 08-03-2012 epic

## 2020-07-29 LAB — SARS CORONAVIRUS 2 (TAT 6-24 HRS): SARS Coronavirus 2: NEGATIVE

## 2020-07-30 NOTE — Anesthesia Preprocedure Evaluation (Addendum)
Anesthesia Evaluation  Patient identified by MRN, date of birth, ID band Patient awake    Reviewed: Allergy & Precautions, NPO status , Patient's Chart, lab work & pertinent test results  History of Anesthesia Complications (+) PONV  Airway Mallampati: II  TM Distance: >3 FB Neck ROM: Full    Dental no notable dental hx. (+) Teeth Intact   Pulmonary neg pulmonary ROS,    Pulmonary exam normal breath sounds clear to auscultation       Cardiovascular hypertension, Pt. on medications Normal cardiovascular exam Rhythm:Regular Rate:Normal     Neuro/Psych  Headaches, negative psych ROS   GI/Hepatic Neg liver ROS, GERD  Medicated,  Endo/Other  Hypothyroidism   Renal/GU negative Renal ROS     Musculoskeletal negative musculoskeletal ROS (+)   Abdominal   Peds  Hematology   Anesthesia Other Findings   Reproductive/Obstetrics                            Anesthesia Physical Anesthesia Plan  ASA: II  Anesthesia Plan: MAC   Post-op Pain Management:    Induction: Intravenous  PONV Risk Score and Plan: 3 and Treatment may vary due to age or medical condition, Ondansetron, Dexamethasone and Midazolam  Airway Management Planned: Natural Airway and Nasal Cannula  Additional Equipment: None  Intra-op Plan:   Post-operative Plan:   Informed Consent: I have reviewed the patients History and Physical, chart, labs and discussed the procedure including the risks, benefits and alternatives for the proposed anesthesia with the patient or authorized representative who has indicated his/her understanding and acceptance.     Dental advisory given  Plan Discussed with: CRNA and Anesthesiologist  Anesthesia Plan Comments:       Anesthesia Quick Evaluation

## 2020-07-31 ENCOUNTER — Encounter (HOSPITAL_BASED_OUTPATIENT_CLINIC_OR_DEPARTMENT_OTHER)
Admission: RE | Disposition: A | Discharge status: Home / Self Care | Payer: Self-pay | Source: Home / Self Care | Attending: General Surgery

## 2020-07-31 ENCOUNTER — Encounter (HOSPITAL_BASED_OUTPATIENT_CLINIC_OR_DEPARTMENT_OTHER): Payer: Self-pay | Admitting: General Surgery

## 2020-07-31 ENCOUNTER — Ambulatory Visit (HOSPITAL_BASED_OUTPATIENT_CLINIC_OR_DEPARTMENT_OTHER): Payer: Commercial Managed Care - PPO | Admitting: Anesthesiology

## 2020-07-31 ENCOUNTER — Ambulatory Visit (HOSPITAL_BASED_OUTPATIENT_CLINIC_OR_DEPARTMENT_OTHER)
Admission: RE | Admit: 2020-07-31 | Discharge: 2020-07-31 | Disposition: A | Discharge status: Home / Self Care | Payer: Commercial Managed Care - PPO | Attending: General Surgery | Admitting: General Surgery

## 2020-07-31 DIAGNOSIS — Z801 Family history of malignant neoplasm of trachea, bronchus and lung: Secondary | ICD-10-CM | POA: Insufficient documentation

## 2020-07-31 DIAGNOSIS — C189 Malignant neoplasm of colon, unspecified: Secondary | ICD-10-CM | POA: Diagnosis present

## 2020-07-31 DIAGNOSIS — Z809 Family history of malignant neoplasm, unspecified: Secondary | ICD-10-CM | POA: Insufficient documentation

## 2020-07-31 DIAGNOSIS — K219 Gastro-esophageal reflux disease without esophagitis: Secondary | ICD-10-CM | POA: Diagnosis not present

## 2020-07-31 DIAGNOSIS — Z86010 Personal history of colonic polyps: Secondary | ICD-10-CM | POA: Diagnosis not present

## 2020-07-31 DIAGNOSIS — G43909 Migraine, unspecified, not intractable, without status migrainosus: Secondary | ICD-10-CM | POA: Insufficient documentation

## 2020-07-31 DIAGNOSIS — E785 Hyperlipidemia, unspecified: Secondary | ICD-10-CM | POA: Diagnosis not present

## 2020-07-31 DIAGNOSIS — Z823 Family history of stroke: Secondary | ICD-10-CM | POA: Insufficient documentation

## 2020-07-31 DIAGNOSIS — Z923 Personal history of irradiation: Secondary | ICD-10-CM | POA: Diagnosis not present

## 2020-07-31 DIAGNOSIS — Z85038 Personal history of other malignant neoplasm of large intestine: Secondary | ICD-10-CM | POA: Insufficient documentation

## 2020-07-31 DIAGNOSIS — Z9221 Personal history of antineoplastic chemotherapy: Secondary | ICD-10-CM | POA: Insufficient documentation

## 2020-07-31 DIAGNOSIS — I10 Essential (primary) hypertension: Secondary | ICD-10-CM | POA: Diagnosis not present

## 2020-07-31 DIAGNOSIS — Z4589 Encounter for adjustment and management of other implanted devices: Secondary | ICD-10-CM | POA: Diagnosis not present

## 2020-07-31 DIAGNOSIS — Z803 Family history of malignant neoplasm of breast: Secondary | ICD-10-CM | POA: Insufficient documentation

## 2020-07-31 DIAGNOSIS — Z808 Family history of malignant neoplasm of other organs or systems: Secondary | ICD-10-CM | POA: Insufficient documentation

## 2020-07-31 DIAGNOSIS — H8109 Meniere's disease, unspecified ear: Secondary | ICD-10-CM | POA: Diagnosis not present

## 2020-07-31 DIAGNOSIS — E039 Hypothyroidism, unspecified: Secondary | ICD-10-CM | POA: Diagnosis not present

## 2020-07-31 DIAGNOSIS — Z8249 Family history of ischemic heart disease and other diseases of the circulatory system: Secondary | ICD-10-CM | POA: Diagnosis not present

## 2020-07-31 HISTORY — PX: PORT-A-CATH REMOVAL: SHX5289

## 2020-07-31 HISTORY — DX: Cerebral cysts: G93.0

## 2020-07-31 HISTORY — DX: Nausea with vomiting, unspecified: R11.2

## 2020-07-31 HISTORY — DX: Postprocedural hypothyroidism: E89.0

## 2020-07-31 HISTORY — DX: Migraine, unspecified, not intractable, without status migrainosus: G43.909

## 2020-07-31 HISTORY — DX: Other specified postprocedural states: Z98.890

## 2020-07-31 LAB — POCT I-STAT, CHEM 8
BUN: 15 mg/dL (ref 6–20)
Calcium, Ion: 1.25 mmol/L (ref 1.15–1.40)
Chloride: 103 mmol/L (ref 98–111)
Creatinine, Ser: 1 mg/dL (ref 0.61–1.24)
Glucose, Bld: 97 mg/dL (ref 70–99)
HCT: 44 % (ref 39.0–52.0)
Hemoglobin: 15 g/dL (ref 13.0–17.0)
Potassium: 3.7 mmol/L (ref 3.5–5.1)
Sodium: 140 mmol/L (ref 135–145)
TCO2: 25 mmol/L (ref 22–32)

## 2020-07-31 SURGERY — REMOVAL PORT-A-CATH
Anesthesia: Monitor Anesthesia Care | Site: Chest

## 2020-07-31 MED ORDER — DEXMEDETOMIDINE (PRECEDEX) IN NS 20 MCG/5ML (4 MCG/ML) IV SYRINGE
PREFILLED_SYRINGE | INTRAVENOUS | Status: AC
  Filled 2020-07-31: qty 5

## 2020-07-31 MED ORDER — FENTANYL CITRATE (PF) 100 MCG/2ML IJ SOLN
INTRAMUSCULAR | Status: AC
  Filled 2020-07-31: qty 2

## 2020-07-31 MED ORDER — PROPOFOL 10 MG/ML IV BOLUS
INTRAVENOUS | Status: AC
  Filled 2020-07-31: qty 20

## 2020-07-31 MED ORDER — CEFAZOLIN SODIUM-DEXTROSE 2-4 GM/100ML-% IV SOLN
2.0000 g | INTRAVENOUS | Status: AC
  Administered 2020-07-31: 2 g via INTRAVENOUS

## 2020-07-31 MED ORDER — ACETAMINOPHEN 500 MG PO TABS
1000.0000 mg | ORAL_TABLET | ORAL | Status: AC
  Administered 2020-07-31: 1000 mg via ORAL

## 2020-07-31 MED ORDER — BUPIVACAINE-EPINEPHRINE 0.5% -1:200000 IJ SOLN
INTRAMUSCULAR | Status: AC
  Filled 2020-07-31: qty 1

## 2020-07-31 MED ORDER — LIDOCAINE HCL 1 % IJ SOLN
INTRAMUSCULAR | Status: DC | PRN
  Administered 2020-07-31: 50 mg via INTRADERMAL

## 2020-07-31 MED ORDER — MIDAZOLAM HCL 2 MG/2ML IJ SOLN
INTRAMUSCULAR | Status: AC
  Filled 2020-07-31: qty 2

## 2020-07-31 MED ORDER — MEPERIDINE HCL 25 MG/ML IJ SOLN: 6.2500 mg | INTRAMUSCULAR | Status: DC | PRN

## 2020-07-31 MED ORDER — SODIUM CHLORIDE 0.9% FLUSH: 3.0000 mL | Freq: Two times a day (BID) | INTRAVENOUS | Status: DC

## 2020-07-31 MED ORDER — BUPIVACAINE-EPINEPHRINE 0.5% -1:200000 IJ SOLN
INTRAMUSCULAR | Status: DC | PRN
  Administered 2020-07-31: 17 mL

## 2020-07-31 MED ORDER — FENTANYL CITRATE (PF) 100 MCG/2ML IJ SOLN
INTRAMUSCULAR | Status: DC | PRN
  Administered 2020-07-31: 25 ug via INTRAVENOUS
  Administered 2020-07-31: 50 ug via INTRAVENOUS

## 2020-07-31 MED ORDER — ONDANSETRON HCL 4 MG/2ML IJ SOLN: 4.0000 mg | Freq: Once | INTRAMUSCULAR | Status: DC | PRN

## 2020-07-31 MED ORDER — PROPOFOL 500 MG/50ML IV EMUL
INTRAVENOUS | Status: DC | PRN
  Administered 2020-07-31: 100 ug/kg/min via INTRAVENOUS

## 2020-07-31 MED ORDER — BUPIVACAINE HCL (PF) 0.5 % IJ SOLN
INTRAMUSCULAR | Status: AC
  Filled 2020-07-31: qty 30

## 2020-07-31 MED ORDER — SCOPOLAMINE 1 MG/3DAYS TD PT72
MEDICATED_PATCH | TRANSDERMAL | Status: AC
  Filled 2020-07-31: qty 1

## 2020-07-31 MED ORDER — BUPIVACAINE LIPOSOME 1.3 % IJ SUSP
INTRAMUSCULAR | Status: AC
  Filled 2020-07-31: qty 20

## 2020-07-31 MED ORDER — OXYCODONE HCL 5 MG/5ML PO SOLN: 5.0000 mg | Freq: Once | ORAL | Status: DC | PRN

## 2020-07-31 MED ORDER — OXYCODONE HCL 5 MG PO TABS: 5.0000 mg | ORAL_TABLET | Freq: Once | ORAL | Status: DC | PRN

## 2020-07-31 MED ORDER — HYDROMORPHONE HCL 1 MG/ML IJ SOLN: 0.2500 mg | INTRAMUSCULAR | Status: DC | PRN

## 2020-07-31 MED ORDER — MIDAZOLAM HCL 5 MG/5ML IJ SOLN
INTRAMUSCULAR | Status: DC | PRN
  Administered 2020-07-31 (×2): 1 mg via INTRAVENOUS

## 2020-07-31 MED ORDER — SODIUM CHLORIDE 0.9% FLUSH: 3.0000 mL | INTRAVENOUS | Status: DC | PRN

## 2020-07-31 MED ORDER — SODIUM CHLORIDE 0.9 % IV SOLN: 250.0000 mL | INTRAVENOUS | Status: DC | PRN

## 2020-07-31 MED ORDER — ACETAMINOPHEN 325 MG RE SUPP: 650.0000 mg | RECTAL | Status: DC | PRN

## 2020-07-31 MED ORDER — ACETAMINOPHEN 325 MG PO TABS: 650.0000 mg | ORAL_TABLET | ORAL | Status: DC | PRN

## 2020-07-31 MED ORDER — DEXAMETHASONE SODIUM PHOSPHATE 10 MG/ML IJ SOLN
INTRAMUSCULAR | Status: AC
  Filled 2020-07-31: qty 1

## 2020-07-31 MED ORDER — ONDANSETRON HCL 4 MG/2ML IJ SOLN
INTRAMUSCULAR | Status: DC | PRN
  Administered 2020-07-31: 4 mg via INTRAVENOUS

## 2020-07-31 MED ORDER — LACTATED RINGERS IV SOLN: INTRAVENOUS | Status: DC

## 2020-07-31 MED ORDER — ACETAMINOPHEN 500 MG PO TABS
ORAL_TABLET | ORAL | Status: AC
  Filled 2020-07-31: qty 2

## 2020-07-31 MED ORDER — ACETAMINOPHEN 10 MG/ML IV SOLN: 1000.0000 mg | Freq: Once | INTRAVENOUS | Status: DC | PRN

## 2020-07-31 MED ORDER — DEXMEDETOMIDINE HCL 200 MCG/2ML IV SOLN
INTRAVENOUS | Status: DC | PRN
  Administered 2020-07-31 (×2): 4 ug via INTRAVENOUS

## 2020-07-31 MED ORDER — SCOPOLAMINE 1 MG/3DAYS TD PT72
1.0000 | MEDICATED_PATCH | TRANSDERMAL | Status: DC
  Administered 2020-07-31: 1 via TRANSDERMAL

## 2020-07-31 MED ORDER — CEFAZOLIN SODIUM-DEXTROSE 2-4 GM/100ML-% IV SOLN
INTRAVENOUS | Status: AC
  Filled 2020-07-31: qty 100

## 2020-07-31 SURGICAL SUPPLY — 37 items
ADH SKN CLS APL DERMABOND .7 (GAUZE/BANDAGES/DRESSINGS) ×1
APL PRP STRL LF DISP 70% ISPRP (MISCELLANEOUS) ×1
BLADE CLIPPER SENSICLIP SURGIC (BLADE) IMPLANT
BLADE HEX COATED 2.75 (ELECTRODE) ×3 IMPLANT
BLADE SURG 15 STRL LF DISP TIS (BLADE) ×1 IMPLANT
BLADE SURG 15 STRL SS (BLADE) ×3
CANISTER SUCT 1200ML W/VALVE (MISCELLANEOUS) IMPLANT
CHLORAPREP W/TINT 26 (MISCELLANEOUS) ×3 IMPLANT
COVER BACK TABLE 60X90IN (DRAPES) ×3 IMPLANT
COVER MAYO STAND STRL (DRAPES) ×3 IMPLANT
COVER WAND RF STERILE (DRAPES) ×3 IMPLANT
DECANTER SPIKE VIAL GLASS SM (MISCELLANEOUS) IMPLANT
DERMABOND ADVANCED (GAUZE/BANDAGES/DRESSINGS) ×2
DERMABOND ADVANCED .7 DNX12 (GAUZE/BANDAGES/DRESSINGS) ×1 IMPLANT
DRAPE LAPAROTOMY TRNSV 102X78 (DRAPES) ×3 IMPLANT
DRAPE UTILITY XL STRL (DRAPES) ×3 IMPLANT
ELECT REM PT RETURN 9FT ADLT (ELECTROSURGICAL) ×3
ELECTRODE REM PT RTRN 9FT ADLT (ELECTROSURGICAL) ×1 IMPLANT
GAUZE SPONGE 4X4 12PLY STRL (GAUZE/BANDAGES/DRESSINGS) ×3 IMPLANT
GLOVE BIO SURGEON STRL SZ 6.5 (GLOVE) ×2 IMPLANT
GLOVE BIO SURGEONS STRL SZ 6.5 (GLOVE) ×1
GLOVE BIOGEL PI IND STRL 7.0 (GLOVE) ×1 IMPLANT
GLOVE BIOGEL PI INDICATOR 7.0 (GLOVE) ×2
NEEDLE HYPO 22GX1.5 SAFETY (NEEDLE) ×3 IMPLANT
NS IRRIG 500ML POUR BTL (IV SOLUTION) IMPLANT
PACK BASIN DAY SURGERY FS (CUSTOM PROCEDURE TRAY) ×3 IMPLANT
PENCIL SMOKE EVACUATOR (MISCELLANEOUS) ×3 IMPLANT
SPONGE LAP 4X18 RFD (DISPOSABLE) IMPLANT
SUT VIC AB 3-0 SH 27 (SUTURE) ×3
SUT VIC AB 3-0 SH 27X BRD (SUTURE) ×1 IMPLANT
SUT VIC AB 4-0 PS2 18 (SUTURE) ×3 IMPLANT
SYR BULB IRRIG 60ML STRL (SYRINGE) ×3 IMPLANT
SYR CONTROL 10ML LL (SYRINGE) ×3 IMPLANT
TOWEL OR 17X26 10 PK STRL BLUE (TOWEL DISPOSABLE) ×3 IMPLANT
TUBE CONNECTING 12'X1/4 (SUCTIONS)
TUBE CONNECTING 12X1/4 (SUCTIONS) IMPLANT
YANKAUER SUCT BULB TIP NO VENT (SUCTIONS) IMPLANT

## 2020-07-31 NOTE — Transfer of Care (Signed)
Immediate Anesthesia Transfer of Care Note  Patient: Jacob Hayes  Procedure(s) Performed: REMOVAL PORT-A-CATH (N/A Chest)  Patient Location: PACU  Anesthesia Type:MAC  Level of Consciousness: awake, alert , oriented and patient cooperative  Airway & Oxygen Therapy: Patient Spontanous Breathing and Patient connected to face mask oxygen  Post-op Assessment: Report given to RN and Post -op Vital signs reviewed and stable  Post vital signs: Reviewed and stable  Last Vitals:  Vitals Value Taken Time  BP 126/66 07/31/20 1227  Temp    Pulse 71 07/31/20 1230  Resp 19 07/31/20 1230  SpO2 100 % 07/31/20 1230  Vitals shown include unvalidated device data.  Last Pain:  Vitals:   07/31/20 0929  TempSrc: Oral  PainSc: 0-No pain      Patients Stated Pain Goal: 5 (16/10/96 0454)  Complications: No complications documented.

## 2020-07-31 NOTE — OR Nursing (Signed)
PORT HAND WASHED ON BACK TABLE AND SENT WITH PATIENT IN LABELED BAG PER KEELA BEASLEY, OR DIRECTOR

## 2020-07-31 NOTE — Op Note (Signed)
07/31/2020  12:22 PM  PATIENT:  Jacob Hayes  39 y.o. male  Patient Care Team: Chesley Noon, MD as PCP - General (Family Medicine) [name], MD (Cardiology) [name], MD as Attending Physician (Gastroenterology)  PRE-OPERATIVE DIAGNOSIS:  COLON CANCER  POST-OPERATIVE DIAGNOSIS:  COLON CANCER  PROCEDURE:  REMOVAL PORT-A-CATH    Surgeon(s): [name], MD  ASSISTANT: none   ANESTHESIA:   local and MAC  EBL: 5 ml Total I/O In: -  Out: 5 [Blood:5]  DRAINS: none   SPECIMEN:  No Specimen  DISPOSITION OF SPECIMEN:  N/A  COUNTS:  YES  PLAN OF CARE: Discharge to home after PACU  PATIENT DISPOSITION:  PACU - hemodynamically stable.  INDICATION: 39 y.o. M s/p completion of chemotherapy   OR FINDINGS: intact power port  DESCRIPTION: the patient was identified in the preoperative holding area and taken to the OR where they were laid supine on the operating room table.  MAC anesthesia was induced without difficulty. SCDs were also noted to be in place prior to the initiation of anesthesia.  The patient was then prepped and draped in the usual sterile fashion.   A surgical timeout was performed indicating the correct patient, procedure, positioning and need for preoperative antibiotics.   I began by making a field block using half percent Marcaine with epinephrine around the incision site.  Once this was complete, a 15 blade scalpel was used to incise the scar of the previous port incision.  Dissection was carried down sharply to the level of the port.  The 2 stay sutures were cut and removed.  The port was then slid out of the pocket.  A 3-0 Vicryl pursestring suture was placed over the catheter entrance site.  The port was removed and the pursestring was tied tightly.  Interrupted 3-0 Vicryl sutures were used to close down the pocket.  A 4-0 running Vicryl subcuticular suture was used to close the skin.  Dermabond was placed over this.  The patient  was then awakened from anesthesia and sent to the postanesthesia care unit in stable condition.  All counts were correct per operating room staff.

## 2020-07-31 NOTE — H&P (Signed)
CC: port in place   HPI: Jacob Hayes is an 39 y.o. male who is here for port removal after chemotherapy.  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, Stage IIIB;  s/p  lower anterior colon resection 08-07-2018 and completed chemo 04/ 2020   Essential hypertension, benign    followed by pcp  (pt had ETT done 08-03-2012 in epic, no ischemia)   Family history of breast cancer    Family history of melanoma    GERD    Hyperlipidemia    Since age 42   Hypothyroidism, postradioiodine therapy    followed by pcp---  s/p  RAI 2014   Meniere's disease    Migraine    PONV (postoperative nausea and vomiting)     Past Surgical History:  Procedure Laterality Date   BIOPSY N/A 03/20/2013   Procedure: BIOPSY;  Surgeon: [name], MD;  [facility]: [facility];  Service: Endoscopy;  Laterality: N/A;  Possible small bowel biopsy   BIOPSY  07/05/2018   Procedure: BIOPSY;  Surgeon: [name], MD;  Location: [facility];  Service: Endoscopy;;  rectal mass   COLONOSCOPY WITH PROPOFOL N/A 07/05/2018   Procedure: COLONOSCOPY WITH PROPOFOL;  Surgeon: [name], MD;  [facility]: [facility];  Service: Endoscopy;  Laterality: N/A;  8:30am   COLONOSCOPY WITH PROPOFOL N/A 07/25/2019   Procedure: COLONOSCOPY WITH PROPOFOL;  Surgeon: [name], MD;  [facility];  Service: Endoscopy;  Laterality: N/A;  2:15pm   ESOPHAGOGASTRODUODENOSCOPY N/A 03/20/2013   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  One slightly nodular.  Small hiatal hernia.  Multiple fundic gland appearing polyps. no evidence of celiac disease, malignancy, h.pylori, barrett's.    POLYPECTOMY   07/05/2018   Procedure: POLYPECTOMY;  Surgeon: [name], MD;  Location: [facility];  Service: Endoscopy;;  hepatic flexurex2;   PORTACATH PLACEMENT N/A 08/23/2018   Procedure: INSERTION PORT-A-CATH;  Surgeon: [name], MD;  Location: [facility];  Service: General;  Laterality: N/A;  LMA   XI ROBOTIC ASSISTED LOWER ANTERIOR RESECTION  08-07-2018   [name]  @[facility]     Family History  Problem Relation Age of Onset   Breast cancer Mother 41   Thyroid disease Mother        Thyroid removed   Lung cancer Mother    Crohn's disease Mother        ???   Heart disease Father    Stroke Father 28   Hyperlipidemia Father    AAA (abdominal aortic aneurysm) Father    Valvular heart disease Half-Brother 47       Bicuspid aortic valve   Lung cancer Maternal Aunt    Heart disease Paternal Grandfather 72       d. 57   Other Half-Sister        lump in her breastw   Melanoma Nephew        pat 1/2 sisters' son dx 5x   Colon cancer Neg Hx     Social:  reports that he has never smoked. He has never used smokeless tobacco. He reports that he does not drink alcohol and  does not use drugs.  Allergies: No Known Allergies  Medications: I have reviewed the patient's current medications.  Results for orders placed or performed during the hospital encounter of 07/31/20 (from the past 48 hour(s))  I-STAT, chem 8     Status: None   Collection Time: 07/31/20  9:49 AM  Result Value Ref Range   Sodium 140 135 - 145 mmol/L   Potassium 3.7 3.5 - 5.1 mmol/L   Chloride 103 98 - 111 mmol/L   BUN 15 6 - 20 mg/dL   Creatinine, Ser 1.00 0.61 - 1.24 mg/dL   Glucose, Bld 97 70 - 99 mg/dL    Comment: Glucose reference range applies only to samples taken after fasting for at least 8 hours.   Calcium, Ion 1.25 1.15 - 1.40 mmol/L   TCO2 25 22 - 32 mmol/L   Hemoglobin 15.0 13.0 - 17.0 g/dL   HCT 44.0 39 - 52 %    No results found.  ROS - all of the below systems have been reviewed with the  patient and positives are indicated with bold text General: chills, fever or night sweats Eyes: blurry vision or double vision ENT: epistaxis or sore throat Allergy/Immunology: itchy/watery eyes or nasal congestion Hematologic/Lymphatic: bleeding problems, blood clots or swollen lymph nodes Endocrine: temperature intolerance or unexpected weight changes Breast: new or changing breast lumps or nipple discharge Resp: cough, shortness of breath, or wheezing CV: chest pain or dyspnea on exertion GI: as per HPI GU: dysuria, trouble voiding, or hematuria MSK: joint pain or joint stiffness Neuro: TIA or stroke symptoms Derm: pruritus and skin lesion changes Psych: anxiety and depression  PE Blood pressure (!) 157/88, pulse 80, temperature 98 F (36.7 C), temperature source Oral, resp. rate 14, height 6\' 3"  (1.905 m), weight 100 kg, SpO2 100 %. Constitutional: NAD; conversant; no deformities Eyes: Moist conjunctiva; no lid lag; anicteric; PERRL Neck: Trachea midline; no thyromegaly Lungs: Normal respiratory effort; no tactile fremitus CV: RRR; no palpable thrills; no pitting edema GI: Abd soft; no palpable hepatosplenomegaly MSK: Normal range of motion of extremities; no clubbing/cyanosis Psychiatric: Appropriate affect; alert and oriented x3 Lymphatic: No palpable cervical or axillary lymphadenopathy  Results for orders placed or performed during the hospital encounter of 07/31/20 (from the past 48 hour(s))  I-STAT, chem 8     Status: None   Collection Time: 07/31/20  9:49 AM  Result Value Ref Range   Sodium 140 135 - 145 mmol/L   Potassium 3.7 3.5 - 5.1 mmol/L   Chloride 103 98 - 111 mmol/L   BUN 15 6 - 20 mg/dL   Creatinine, Ser 1.00 0.61 - 1.24 mg/dL   Glucose, Bld 97 70 - 99 mg/dL    Comment: Glucose reference range applies only to samples taken after fasting for at least 8 hours.   Calcium, Ion 1.25 1.15 - 1.40 mmol/L   TCO2 25 22 - 32 mmol/L   Hemoglobin 15.0 13.0 - 17.0  g/dL   HCT 44.0 39 - 52 %    No results found.   A/P: Jacob Hayes is an 39 y.o. male with port in place.  He has completed chemotherapy and is ready for removal. Risks include bleeding, infection and damage to adjacent structures.  These risks are all very minimal.     Rosario Adie, MD  [facility]

## 2020-07-31 NOTE — Anesthesia Postprocedure Evaluation (Signed)
Anesthesia Post Note  Patient: Jacob Hayes  Procedure(s) Performed: REMOVAL PORT-A-CATH (N/A Chest)     Patient location during evaluation: PACU Anesthesia Type: MAC Level of consciousness: awake and alert Pain management: pain level controlled Vital Signs Assessment: post-procedure vital signs reviewed and stable Respiratory status: spontaneous breathing, nonlabored ventilation, respiratory function stable and patient connected to nasal cannula oxygen Cardiovascular status: stable and blood pressure returned to baseline Postop Assessment: no apparent nausea or vomiting Anesthetic complications: no   No complications documented.  Last Vitals:  Vitals:   07/31/20 0929  BP: (!) 157/88  Pulse: 80  Resp: 14  Temp: 36.7 C  SpO2: 100%    Last Pain:  Vitals:   07/31/20 0929  TempSrc: Oral  PainSc: 0-No pain                 Barnet Glasgow

## 2020-07-31 NOTE — Discharge Instructions (Addendum)
GENERAL SURGERY: POST OP INSTRUCTIONS  1. DIET: Follow a light bland diet the first 24 hours after arrival home, such as soup, liquids, crackers, etc.  Be sure to include lots of fluids daily.  Avoid fast food or heavy meals as your are more likely to get nauseated.   2. Take your usually prescribed home medications unless otherwise directed. 3. PAIN CONTROL: a. Pain is best controlled by a usual combination of three different methods TOGETHER: i. Ice/Heat ii. Over the counter pain medication  b. Most patients will experience some swelling and bruising around the incisions.  Ice packs or heating pads (30-60 minutes up to 6 times a day) will help. Use ice for the first few days to help decrease swelling and bruising, then switch to heat to help relax tight/sore spots and speed recovery.  Some people prefer to use ice alone, heat alone, alternating between ice & heat.  Experiment to what works for you.  Swelling and bruising can take several weeks to resolve.   c. It is helpful to take an over-the-counter pain medication regularly for the first few weeks.  Choose one of the following that works best for you: i. Naproxen (Aleve, etc)  Two 220mg  tabs twice a day ii. Ibuprofen (Advil, etc) Three 200mg  tabs four times a day (every meal & bedtime) 4. Avoid getting constipated.  Between the surgery and the pain medications, it is common to experience some constipation.  Increasing fluid intake and taking a fiber supplement (such as Metamucil, Citrucel, FiberCon, MiraLax, etc) 1-2 times a day regularly will usually help prevent this problem from occurring.  A mild laxative (prune juice, Milk of Magnesia, MiraLax, etc) should be taken according to package directions if there are no bowel movements after 48 hours.   5. Wash / shower every day.  You may shower over the dressings as they are waterproof.  Continue to shower over incision(s) after the dressing is off. 6. You may leave the incision open to air.  You  have skin glue (Dermabond) covering the incision(s).  It should wear off in about a week.  You may replace a dressing/Band-Aid to cover the incision for comfort if you wish.   7. ACTIVITIES as tolerated:   a. You may resume regular (light) daily activities beginning the next day--such as daily self-care, walking, climbing stairs--gradually increasing activities as tolerated.  If you can walk 30 minutes without difficulty, it is safe to try more intense activity such as jogging, treadmill, bicycling, low-impact aerobics, swimming, etc. b. Avoid strenuous activity involving the affected shoulder or heavy lifting for 48 hrs c. DO NOT PUSH THROUGH PAIN.  Let pain be your guide: If it hurts to do something, don't do it.  Pain is your body warning you to avoid that activity for another week until the pain goes down. d. You may drive when you are no longer taking prescription pain medication, you can comfortably wear a seatbelt, and you can safely maneuver your car and apply brakes. e. Dennis Bast may have sexual intercourse when it is comfortable.  8. FOLLOW UP in our office a. Please call [facility] at [phone] to set up an appointment to see your surgeon in the office for a follow-up appointment approximately 2-3 weeks after your surgery. b. Make sure that you call for this appointment the day you arrive home to insure a convenient appointment time. 9. IF YOU HAVE DISABILITY OR FAMILY LEAVE FORMS, BRING THEM TO THE OFFICE FOR PROCESSING.  DO NOT  GIVE THEM TO YOUR DOCTOR.   WHEN TO CALL us [phone]: 1. Poor pain control 2. Reactions / problems with new medications (rash/itching, nausea, etc)  3. Fever over 101.5 F (38.5 C) 4. Worsening swelling or bruising 5. Continued bleeding from incision. 6. Increased pain, redness, or drainage from the incision   The clinic staff is available to answer your questions during regular business hours (8:30am-5pm).  Please don't hesitate to call and ask to speak to one  of our nurses for clinical concerns.   If you have a medical emergency, go to the nearest emergency room or call 911.  A surgeon from [facility] is always on call at the [facility], [place], [place], [place], [place]  [address] ? MAIN: [phone] ? TOLL FREE: [phone] ?  FAX [phone] [email or web address]   Post Anesthesia Home Care Instructions  Activity: Get plenty of rest for the remainder of the day. A responsible adult should stay with you for 24 hours following the procedure.  For the next 24 hours, DO NOT: -Drive a car -Paediatric nurse -Drink alcoholic beverages -Take any medication unless instructed by your physician -Make any legal decisions or sign important papers.  Meals: Start with liquid foods such as gelatin or soup. Progress to regular foods as tolerated. Avoid greasy, spicy, heavy foods. If nausea and/or vomiting occur, drink only clear liquids until the nausea and/or vomiting subsides. Call your physician if vomiting continues.  Special Instructions/Symptoms: Your throat may feel dry or sore from the anesthesia or the breathing tube placed in your throat during surgery. If this causes discomfort, gargle with warm salt water. The discomfort should disappear within 24 hours.  If you had a scopolamine patch placed behind your ear for the management of post- operative nausea and/or vomiting:  1. The medication in the patch is effective for 72 hours, after which it should be removed.  Wrap patch in a tissue and discard in the trash. Wash hands thoroughly with soap and water. 2. You may remove the patch earlier than 72 hours if you experience unpleasant side effects which may include dry mouth, dizziness or visual disturbances. 3. Avoid touching the patch. Wash your hands with soap and water after contact with the patch.

## 2020-08-03 ENCOUNTER — Encounter (HOSPITAL_BASED_OUTPATIENT_CLINIC_OR_DEPARTMENT_OTHER): Payer: Self-pay | Admitting: General Surgery

## 2020-08-04 ENCOUNTER — Encounter: Payer: Self-pay | Admitting: Internal Medicine

## 2020-08-05 ENCOUNTER — Telehealth: Payer: Self-pay

## 2020-08-05 NOTE — Telephone Encounter (Signed)
PROGRESS NOTE:  Patient advice request message sent to Dr. Mickeal Skinner and POD 4 for follow up:  I had spoke with someone from [place] about PT from the home.  I had told her I believe I was supposed to get outpatient vestibular rehab and she mentioned she would talk to you about change the referral.    I do know that I have to get prior approval for this to be covered by my insurance and I am eager to start this soon.  If there are options in [facility] that you feel comfortable with that would be best.  I meet with Neurosurgery 9/20 for the occipital neuralgia.  I am not sure if they have received prior authorization or not.  Should I contact them directly?

## 2020-08-07 ENCOUNTER — Encounter: Payer: Self-pay | Admitting: *Deleted

## 2020-08-07 ENCOUNTER — Other Ambulatory Visit: Payer: Self-pay | Admitting: *Deleted

## 2020-08-07 DIAGNOSIS — R42 Dizziness and giddiness: Secondary | ICD-10-CM

## 2020-08-12 ENCOUNTER — Other Ambulatory Visit: Payer: Self-pay

## 2020-08-12 ENCOUNTER — Ambulatory Visit: Payer: Commercial Managed Care - PPO | Attending: Internal Medicine | Admitting: Physical Therapy

## 2020-08-12 DIAGNOSIS — R42 Dizziness and giddiness: Secondary | ICD-10-CM | POA: Diagnosis present

## 2020-08-12 NOTE — Therapy (Signed)
[facility] [address]. [place], [place], [address] Phone: [phone]   Fax:  [phone]  Physical Therapy Evaluation  Patient Details  Name: Jacob Hayes MRN: [id number] Date of Birth: February 13, 1981 Referring Provider (PT): Dr. Cecil Cobbs   Encounter Date: 08/12/2020   PT End of Session - 08/13/20 1550    Visit Number 1    Number of Visits 9    Date for PT Re-Evaluation 10/08/20    PT Start Time 1409    PT Stop Time 1523    PT Time Calculation (min) 74 min    Equipment Utilized During Treatment Gait belt    Activity Tolerance Patient tolerated treatment well    Behavior During Therapy [facility] for tasks assessed/performed           Past Medical History:  Diagnosis Date  . Allergic rhinitis   . Arachnoid cyst    chronic and stable per oncology note pt was had work-up at [facility]  . Bruxism   . 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 Essential hypertension, benign    followed by pcp  (pt had ETT done 08-03-2012 in epic, no ischemia)  . Family history of breast cancer   . Family history of melanoma   . GERD   . Hyperlipidemia    Since age 67  . Hypothyroidism, postradioiodine therapy    followed by pcp---  s/p  RAI 2014  . Meniere's disease   . Migraine   . PONV (postoperative nausea and vomiting)     Past Surgical History:  Procedure Laterality Date  . BIOPSY N/A 03/20/2013   Procedure: BIOPSY;  Surgeon: [name], MD;  [facility];  Service: Endoscopy;  Laterality: N/A;  Possible small bowel biopsy  . BIOPSY  07/05/2018   Procedure: BIOPSY;  Surgeon: [name], MD;  [facility];  Service: Endoscopy;;  rectal mass  . COLONOSCOPY WITH PROPOFOL N/A 07/05/2018   Procedure: COLONOSCOPY WITH PROPOFOL;  Surgeon: [name], MD;  [facility];  Service: Endoscopy;  Laterality: N/A;  8:30am  . COLONOSCOPY WITH PROPOFOL N/A 07/25/2019   Procedure: COLONOSCOPY WITH PROPOFOL;  Surgeon: [name], MD;  [facility];  Service: Endoscopy;  Laterality: N/A;  2:15pm  . ESOPHAGOGASTRODUODENOSCOPY N/A 03/20/2013   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  One slightly nodular.  Small hiatal hernia.  Multiple fundic gland appearing polyps. no evidence of celiac disease, malignancy, h.pylori, barrett's.   Marland Kitchen POLYPECTOMY  07/05/2018   Procedure: POLYPECTOMY;  Surgeon: [name], MD;  [facility];  Service: Endoscopy;;  hepatic flexurex2;  . PORT-A-CATH REMOVAL N/A 07/31/2020   Procedure: REMOVAL PORT-A-CATH;  Surgeon: [name], MD;  Location: [facility];  Service: General;  Laterality: N/A;  . PORTACATH PLACEMENT N/A 08/23/2018   Procedure: INSERTION PORT-A-CATH;  Surgeon: [name], MD;  Location: [facility];  Service: General;  Laterality: N/A;  LMA  . XI ROBOTIC ASSISTED LOWER ANTERIOR RESECTION  08-07-2018   [name]  @[facility]     There were no vitals filed for this visit.    Subjective Assessment - 08/13/20 1547    Subjective Patient reports that he experiences constant sensation of  feeling like he is on a boat now and that his dizziness symptoms have worsened since he received chemotherapy and radiation for treatment of colon cancer.    Pertinent History Patient has had longstanding intermittent dizziness issues for 10 years or more, but it has worsened since chemotherapy.  Patient reports that he constantly feels like he is "on a boat all the time". Patient has a history of motion sickness since childhood, but this worsened as well.  Patient reports that he will get motion sickness with nausea now when he drives a car as well as when he is in a passenger.  Patient reports that he  restricts his travel because of the increase in his dizziness and motion sickness symptoms.  Patient reports that prior to Covid he frequently traveled for his work.  Patient has a 10-year history or more of migraines which increased when he started chemotherapy in 2019.  In addition per medical record, patient developed severe neuropathic symptoms primarily in his hands which have mostly resolved.  Patient reports that he also had bilateral feet numbness after chemotherapy and then pins-and-needles which have also resolved. Patient reports that he had a work-up performed at [facility] neurology including an EEG.  Patient states he was started on Topamax as he reports that they thought that he was having vestibular migraines.  Patient recently has been seen by neurology and reports that he was diagnosed with occipital neuralgia and was referred to [place] [facility] for a greater occipital nerve block for headaches. Patient reports that he has a stiff neck and neck pain every few days now. Patient reports that he has an appointment tomorrow to receive the occipital nerve block.  Patient reports that activities that increase his dizziness include motion, riding in cars, watching video games, sudden head movements and body turns.  Patient reports nothing eases his dizziness.  Patient reports medications such as Valium, meclizine help to manage his symptoms, but it does not prevent them from occurring or from escalating his symptoms.  Patient reports that he has had no prior physical therapy for neck or vestibular issues and patient reports he has not been seen by an ear nose and throat physician in regards to his vestibular issues.    Diagnostic tests CT head on 06/23/2020: No evidence of acute intracranial abnormality or intracranial metastatic disease.  Posterior fossa arachnoid cyst unchanged as compared to MRI on 08/29/2013    Patient Stated Goals To have decreased dizziness and decreased motion  sensitivity    Currently in Pain? Other (Comment)   None stated             [facility] PT Assessment - 08/12/20 1455      Assessment   Medical Diagnosis Dizziness and giddiness    Referring Provider (PT) Dr. Cecil Cobbs    Prior Therapy None      Precautions   Precautions None      Restrictions   Weight Bearing Restrictions No      Balance Screen   Has the patient fallen in the past 6 months No    Has the patient had a decrease in activity level because of a fear of falling?  No    Is the patient reluctant to leave their home because of a fear of falling?  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[employer]; walking the line with head turns, standing      Cognition   Overall Cognitive Status Within Functional Limits for tasks assessed      Standardized Balance Assessment   Standardized Balance Assessment Dynamic Gait Index      Dynamic Gait Index   Level Surface Normal    Change in Gait Speed Normal    Gait with Horizontal Head Turns Normal    Gait with Vertical Head Turns Mild Impairment    Gait and Pivot Turn Normal    Step Over Obstacle Normal    Step Around Obstacles Normal    Steps Normal    Total Score 23             VESTIBULAR AND BALANCE EVALUATION   HISTORY:  Subjective history of current problem: Patient has had longstanding intermittent dizziness issues for 10 years or more, but it has worsened since chemotherapy.  Patient reports that he constantly feels like he is "on a boat all the time". Patient has a history of motion sickness since childhood, but this worsened as well.  Patient reports that he will get motion sickness with nausea now when he drives a car as well as when he is in a passenger.  Patient reports that he restricts  his travel because of the increase in his dizziness and motion sickness symptoms.  Patient reports that prior to Covid he frequently traveled for his work.  Patient has a 10-year history or more of migraines which increased when he started chemotherapy in 2019.  In addition per medical record, patient developed severe neuropathic symptoms primarily in his hands which have mostly resolved.  Patient reports that he also had bilateral feet numbness after chemotherapy and then pins-and-needles which have also resolved. Patient reports that he had a work-up performed at [facility] neurology including an EEG.  Patient states he was started on Topamax as he reports that they thought that he was having vestibular migraines.  Patient recently has been seen by neurology and reports that he was diagnosed with occipital neuralgia and was referred to [place] [facility] for a greater occipital nerve block for headaches. Patient reports that he has a stiff neck and neck pain every few days now. Patient reports that he has an appointment tomorrow to receive the occipital nerve block.  Patient reports that activities that increase his dizziness include motion, riding in cars, watching video games, sudden head movements and body turns.  Patient reports nothing eases his dizziness.  Patient reports medications such as Valium, meclizine help to manage his symptoms, but it does not prevent them from occurring or from escalating his symptoms.  Patient reports that he has had no prior physical therapy for neck or vestibular issues and patient reports he has not been seen by an ear nose and throat physician in regards to his vestibular issues. Patient reports that several years ago he had episodes of passing out while in a car that were linked to heat and he was referred to an endocrinologist as well as [place] [facility].  Patient had 4 or 5 episodes in 2011-2013 where at random times he would experience nausea, vomiting, flushed  sensation, sweating and dizziness.  Patient reports he had a few episodes at night when he would get up to use the restroom where he passed out then he would become aware of his surroundings, but would not be  able to move and he would experience a full body sweat.  These episodes were observed by his ex-wife.  Patient reports that since he has seen the endocrinologist and his TSH levels have gotten under control he has not had any further episodes of this type.  Description of dizziness:  Floating/rocking, lightheadedness, aural fullness, vertigo Frequency: daily  Duration: constant Symptom nature: motion provoked, constant  Progression of symptoms: worse History of similar episodes: Yes  Falls (yes/no): no Number of falls in past 6 months: 0  Auditory complaints (tinnitus, pain, drainage): mild drainage and reports more of a fluid sensation not a ringing (yawning will cause his ear to crackle) in bilateral ears; patient reports that he gets regular hearing checks at his job and that he has experienced no loss in hearing. Vision (last eye exam, diplopia, recent changes): denies; does not wear glasses or contacts; last eye exam last year 20/15 vision  Current Symptoms: (dysarthria, dysphagia, drop attacks, bowel and bladder changes, recent weight loss/gain) patient with history of colon cancer patient has undergone chemotherapy as well as radiation treatments.    EXAMINATION  SOMATOSENSORY:  Patient reports that bilateral feet were completely numb after chemotherapy treatments initially and he also had pins-and-needles, but patient reports these symptoms have fully resolved. Patient wearing ankle high steel toe boots this date, but will plan to assess light touch sensation bilateral feet next session.      COORDINATION: Finger to Nose:   Normal Past Pointing:   Normal   MUSCULOSKELETAL SCREEN: Cervical Spine ROM: WNL AROM cervical without pain.  Gait: Patient arrives ambulating  without AD. Patient ambulates with good cadence with good step height and step length with fair scanning of the of the visual environment. Patient able to ambulate up down 4 steps without use of railing with step over step pattern independently.  Balance: Patient is challenged by uneven surfaces, narrow base of support, head turning and body turning activities and eyes closed activities.  POSTURAL CONTROL TESTS:  Clinical Test of Sensory Interaction for Balance (CTSIB):  CONDITION TIME STRATEGY SWAY  Eyes open, firm surface 30 seconds ankle  +1  Eyes closed, firm surface 30 seconds ankle  +1  Eyes open, foam surface 30 seconds ankle  +2  Eyes closed, foam surface 30 seconds ankle, hip  +3  Note: The above test however was performed with patient wearing ankle high steel toe boots which may have impacted the score as the ankle support provided increased ability.  Will plan on repeating without shoes on next visit.  OCULOMOTOR / VESTIBULAR TESTING:  Oculomotor Exam- Room Light  Normal Abnormal Comments  Ocular Alignment N    Ocular ROM N    Spontaneous Nystagmus N    Gaze evoked Nystagmus   abnormal Right beating at end range with gaze right  Smooth Pursuit N    Saccades N    VOR   abnormal  reports dizziness  VOR Cancellation N  Denies blurring or dizziness  Left Head Impulse   abnormal  corrective saccades noted and reproduce dizziness  Right Head Impulse    one corrective saccade noted  Head Shaking Nystagmus  Abn Symptomatic 8/10 dizziness with no nystagmus observed in room light   Note: After performing the dynamic gait index test and bedside visual ocular motor examination, patient reports that he experienced vertigo and felt that he had a difficult time Keeping his eyes focused on targets. Patient reported short sitting rest break for symptoms to decrease.  BPPV  TESTS: Deferred to be assessed next session  FUNCTIONAL OUTCOME MEASURES:  Results Comments  DHI  38/100  low  perception of handicap; in need of intervention  Abbreviated ABC Scale  65%  mild falls risk; in need of intervention  DGI  23/24  safe community mobility  FOTO  94/100 Given the patient's risk adjustment variables, like-patients nationally had a FS score of 93/100 at intake    Neuromuscular Re-education:  VOR X 1 exercise:  Demonstrated and educated as to VOR X1.  Patient performed VOR X 1 horizontal in sitting 3 reps of 30 seconds each with verbal cues for technique.  Patient reports 4-5/10 dizziness which he describes as a sloshing sensation in his ears and a sensation of floating, "glazed back" sensation. Issued VOR x1 in sitting with plain background for 30 second reps for home exercise program.     PT Education - 08/13/20 1549    Education Details Discussed plan of care and goals; demonstrated and educated as to VOR x1 in sitting and issued for HEP    Person(s) Educated Patient    Methods Explanation;Demonstration;Verbal cues;Handout    Comprehension Verbalized understanding;Returned demonstration            PT Short Term Goals - 08/13/20 1556      PT SHORT TERM GOAL #1   Title Patient will be independent with home exercise program in order to decrease dizziness symptoms and improve balance in order to improve function at home and work.    Time 4    Period Weeks    Status New    Target Date 09/10/20             PT Long Term Goals - 08/13/20 1557      PT LONG TERM GOAL #1   Title Patient will have a decrease in dizziness handicap inventory score by 10 points or more to indicate decreased dizziness symptoms.    Baseline Scored 38/100 on 08/12/2020;    Time 8    Period Weeks    Status New    Target Date 10/08/20      PT LONG TERM GOAL #2   Title Patient will report 50% or greater improvement in his symptoms of dizziness with provoking motions or positions.    Time 8    Period Weeks    Status New    Target Date 10/08/20      PT LONG TERM GOAL #3   Title  Patient will score 75% or greater on the abbreviated ABC-6 scale in order to demonstrate improvement in patient's balance for safety with community activities.    Baseline Scored 65% on abbreviated ABC-6 scale on 08/12/2020;    Time 8    Period Weeks    Status New    Target Date 10/08/20                  Plan - 08/13/20 1552    Clinical Impression Statement Patient presents to clinic with concerns of 10-year history of intermittent dizziness and motion sensitivity issues.  Patient reports that the dizziness and motion sensitivity has significantly increased since he underwent chemotherapy and radiation for colon cancer treatment.  Patient with potential indicators of central and peripheral signs as evidenced by abnormal VOR, head impulse test, positive headshaking nystagmus test and gaze evoked nystagmus.  Deferred Dix-Hallpike test and canal testing this visit but will plan to perform at next session.  Patient scored 65% on the abbreviated ABC-6 scale which indicates falls  risk.  Patient noted to have difficulty with uneven surfaces, narrow base of support, activities with head turns and eyes closed activities this date.  Patient would benefit from vestibular rehab to try to reduce patient's subjective symptoms of dizziness and motion sensitivity and in order to address functional deficits and goals as stated on plan of care.    Personal Factors and Comorbidities Comorbidity 3+;Past/Current Experience;Time since onset of injury/illness/exacerbation    Comorbidities Colon cancer with chemotherapy and radiation, hypertension, hypothyroidism, migraines, Mnire's, anxiety/depression    Examination-Participation Restrictions Other;Driving   Travel, riding in cars   Stability/Clinical Decision Making Unstable/Unpredictable    Clinical Decision Making High    Rehab Potential Fair    PT Frequency 1x / week    PT Duration 8 weeks    PT Treatment/Interventions Canalith Repostioning;Balance  training;Neuromuscular re-education;Patient/family education;Vestibular;Dry needling;Spinal Manipulations;Manual techniques;Therapeutic exercise;Therapeutic activities;Gait training    PT Next Visit Plan Review VOR x1 and progress as tolerated, perform activities in standing with head and body turns on uneven surfaces    PT Home Exercise Plan VOR x1 in sitting 30 seconds reps           Patient will benefit from skilled therapeutic intervention in order to improve the following deficits and impairments:  Decreased balance, Dizziness, Pain  Visit Diagnosis: Dizziness and giddiness     Problem List Patient Active Problem List   Diagnosis Date Noted  . Arachnoid cyst 07/10/2020  . Genetic testing 11/23/2018  . Port-A-Cath in place 11/16/2018  . Family history of breast cancer   . Family history of melanoma   . Cancer of sigmoid colon ([place]) 08/07/2018  . 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 Rectal bleeding 06/14/2018  . Change in bowel function 06/14/2018  . Abnormal TSH 03/07/2013  . Abdominal pain, epigastric 03/06/2013  . Nausea alone 03/06/2013  . Bloating 03/06/2013  . Dizziness 08/02/2012  . Heart murmur 08/02/2012  . Migraine headache without aura 10/19/2011  . HYPERLIPIDEMIA 07/07/2010  . Essential hypertension, benign 07/07/2010  . GERD 07/07/2010   Lady Deutscher PT, DPT [phone]  Lady Deutscher 08/13/2020, 4:43 PM  [place] [address] [place], [place], [address] Phone: [phone]   Fax:  [phone]  Name: Jacob Hayes MRN: [id number] Date of Birth: [birth year]

## 2020-08-13 ENCOUNTER — Other Ambulatory Visit: Payer: Self-pay | Admitting: *Deleted

## 2020-08-13 NOTE — Progress Notes (Signed)
Patient called requesting a new referral to another neurology/neurosurgery office for occipital nerve block.  Reports issues with insurance and coordination of care with [facility].  Referral placed to Dr. Jennings Books (Neurologist) at [facility] in [place] as patient works in [place].    Referral faxed to [phone]  Patient made aware.

## 2020-08-19 ENCOUNTER — Encounter: Payer: Self-pay | Admitting: Physical Therapy

## 2020-08-19 ENCOUNTER — Ambulatory Visit: Payer: Commercial Managed Care - PPO | Admitting: Physical Therapy

## 2020-08-19 ENCOUNTER — Encounter: Payer: Commercial Managed Care - PPO | Admitting: Physical Therapy

## 2020-08-19 ENCOUNTER — Other Ambulatory Visit: Payer: Self-pay

## 2020-08-19 DIAGNOSIS — R42 Dizziness and giddiness: Secondary | ICD-10-CM

## 2020-08-19 NOTE — Therapy (Signed)
[place] [facility] [address] [place], [place], [address] Phone: [phone]   Fax:  [phone]  Physical Therapy Treatment  Patient Details  Name: Jacob Hayes MRN: [id number] Date of Birth: 09-Aug-1981 Referring Provider (PT): Dr. Cecil Cobbs   Encounter Date: 08/19/2020   PT End of Session - 08/19/20 1402    Visit Number 2    Number of Visits 9    Date for PT Re-Evaluation 10/08/20    PT Start Time 1400    PT Stop Time 1450    PT Time Calculation (min) 50 min    Activity Tolerance Patient tolerated treatment well    Behavior During Therapy [facility] for tasks assessed/performed           Past Medical History:  Diagnosis Date  . Allergic rhinitis   . Arachnoid cyst    chronic and stable per oncology note pt was had work-up at [facility]  . Bruxism   . 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 Essential hypertension, benign    followed by pcp  (pt had ETT done 08-03-2012 in epic, no ischemia)  . Family history of breast cancer   . Family history of melanoma   . GERD   . Hyperlipidemia    Since age 39  . Hypothyroidism, postradioiodine therapy    followed by pcp---  s/p  RAI 2014  . Meniere's disease   . Migraine   . PONV (postoperative nausea and vomiting)     Past Surgical History:  Procedure Laterality Date  . BIOPSY N/A 03/20/2013   Procedure: BIOPSY;  Surgeon: [name], MD;  [facility]: [facility];  Service: Endoscopy;  Laterality: N/A;  Possible small bowel biopsy  . BIOPSY  07/05/2018   Procedure: BIOPSY;  Surgeon: [name], MD;  Location: [facility];  Service: Endoscopy;;  rectal mass  . COLONOSCOPY WITH PROPOFOL N/A 07/05/2018   Procedure: COLONOSCOPY WITH PROPOFOL;  Surgeon: [name], MD;  [facility]: [facility];  Service: Endoscopy;  Laterality: N/A;  8:30am    . COLONOSCOPY WITH PROPOFOL N/A 07/25/2019   Procedure: COLONOSCOPY WITH PROPOFOL;  Surgeon: [name], MD;  [facility];  Service: Endoscopy;  Laterality: N/A;  2:15pm  . ESOPHAGOGASTRODUODENOSCOPY N/A 03/20/2013   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  One slightly nodular.  Small hiatal hernia.  Multiple fundic gland appearing polyps. no evidence of celiac disease, malignancy, h.pylori, barrett's.   Marland Kitchen POLYPECTOMY  07/05/2018   Procedure: POLYPECTOMY;  Surgeon: [name], MD;  Location: [facility];  Service: Endoscopy;;  hepatic flexurex2;  . PORT-A-CATH REMOVAL N/A 07/31/2020   Procedure: REMOVAL PORT-A-CATH;  Surgeon: [name], MD;  Location: [facility];  Service: General;  Laterality: N/A;  . PORTACATH PLACEMENT N/A 08/23/2018   Procedure: INSERTION PORT-A-CATH;  Surgeon: [name], MD;  Location: [facility];  Service: General;  Laterality: N/A;  LMA  . XI ROBOTIC ASSISTED LOWER ANTERIOR RESECTION  08-07-2018   [name]  @[facility]     There were no vitals filed for this visit.   Subjective Assessment - 08/19/20 1402    Subjective Patient is trying to get schedled to for an occipital nerve block. Patient reports that he did  some of the exercises but was not able to get up to the 3 reps 3 times a day but states when he did the exercise it reproduced his symptoms.    Pertinent History Patient has had longstanding intermittent dizziness issues for 10 years or more, but it has worsened since chemotherapy.  Patient reports that he constantly feels like he is "on a boat all the time". Patient has a history of motion sickness since childhood, but this worsened as well.  Patient reports that he will get motion sickness with nausea now when he drives a car as well as when he is in a passenger.  Patient reports that he restricts his travel because of the  increase in his dizziness and motion sickness symptoms.  Patient reports that prior to Covid he frequently traveled for his work.  Patient has a 10-year history or more of migraines which increased when he started chemotherapy in 2019.  In addition per medical record, patient developed severe neuropathic symptoms primarily in his hands which have mostly resolved.  Patient reports that he also had bilateral feet numbness after chemotherapy and then pins-and-needles which have also resolved. Patient reports that he had a work-up performed at [facility] neurology including an EEG.  Patient states he was started on Topamax as he reports that they thought that he was having vestibular migraines.  Patient recently has been seen by neurology and reports that he was diagnosed with occipital neuralgia and was referred to [place] [facility] for a greater occipital nerve block for headaches. Patient reports that he has a stiff neck and neck pain every few days now. Patient reports that he has an appointment tomorrow to receive the occipital nerve block.  Patient reports that activities that increase his dizziness include motion, riding in cars, watching video games, sudden head movements and body turns.  Patient reports nothing eases his dizziness.  Patient reports medications such as Valium, meclizine help to manage his symptoms, but it does not prevent them from occurring or from escalating his symptoms.  Patient reports that he has had no prior physical therapy for neck or vestibular issues and patient reports he has not been seen by an ear nose and throat physician in regards to his vestibular issues.    Diagnostic tests CT head on 06/23/2020: No evidence of acute intracranial abnormality or intracranial metastatic disease.  Posterior fossa arachnoid cyst unchanged as compared to MRI on 08/29/2013    Patient Stated Goals To have decreased dizziness and decreased motion sensitivity          NEUROMUSCULAR  Re-education:  VOR X 1 exercise:  Patient reports 3/10 dizziness at rest. Patient performed VOR X 1 horizontal in sitting 1 rep of 30 seconds with verbal cues for decreasing head speed. Patient reports 4-5/10 dizziness after 30 seconds rep.   Performed VOR x1 horizontal in sitting 1 rep of 60 seconds demonstrating good technique. Patient reports 6-7/10 dizziness after 1 minute rep. Instructed patient to continue to perform 3 reps of 30 seconds in sitting with plain background VOR x1 for home exercise program.  Patient performed in standing on Airex pad feet together and semitandem progressions with horizontal, vertical and diagonal head turns.  Patient reports mild increase in dizziness with horizontal head turns and moderate increase in dizziness with diagonal head turns.  Patient required contact-guard assist with these activities secondary to increased sway. Issued feet together and semitandem progressions with horizontal head turns 1 minute reps for home exercise program.  Patient performed forward ambulation 150 feet with scanning for targets with standby assist. Patient denies dizziness with this activity and no veering noted. Patient perform retro ambulation 150 feet with scanning for targets with standby assist. Patient reports mild dizziness symptoms rated 3/10 with retro ambulation.    Clinical Test of Sensory Interaction for Balance    (CTSIB): CONDITION TIME STRATEGY SWAY  Eyes open, firm surface  30 seconds  ankle  +1  Eyes closed, firm surface  30 seconds  ankle  +2  Eyes open, foam surface  30 seconds  ankle  +2  Eyes closed, foam surface  30 seconds  ankle, hip  +3    Oculomotor Exam- Fixation Suppressed  Normal Abnormal Comments  Ocular Alignment  normal    Ocular ROM     Spontaneous Nystagmus  normal    Gaze evoked Nystagmus  normal    Tragal test  normal    Valsalva test  normal      BPPV TESTS:  Symptoms Duration Intensity Nystagmus  Left Dix-Hallpike None   N/A None observed  Right Dix-Hallpike None  N/A None observed  Left Head Roll None  N/A None observed  Right Head Roll None  N/A None observed      PT Education - 08/19/20 1515    Education Details Reviewed VOR x1 exercise and provided cueing to decrease rate of head speed and patient reports he was better able to tolerate after decreasing head speed.  Added feet together and semitandem progressions with horizontal head turns for home exercise program.  Provided handout on vestibular migraines from vestibular.org website.    Person(s) Educated Patient    Methods Demonstration;Explanation;Handout;Verbal cues    Comprehension Verbalized understanding;Returned demonstration            PT Short Term Goals - 08/13/20 1556      PT SHORT TERM GOAL #1   Title Patient will be independent with home exercise program in order to decrease dizziness symptoms and improve balance in order to improve function at home and work.    Time 4    Period Weeks    Status New    Target Date 09/10/20             PT Long Term Goals - 08/13/20 1557      PT LONG TERM GOAL #1   Title Patient will have a decrease in dizziness handicap inventory score by 10 points or more to indicate decreased dizziness symptoms.    Baseline Scored 38/100 on 08/12/2020;    Time 8    Period Weeks    Status New    Target Date 10/08/20      PT LONG TERM GOAL #2   Title Patient will report 50% or greater improvement in his symptoms of dizziness with provoking motions or positions.    Time 8    Period Weeks    Status New    Target Date 10/08/20      PT LONG TERM GOAL #3   Title Patient will score 75% or greater on the abbreviated ABC-6 scale in order to demonstrate improvement in patient's balance for safety with community activities.    Baseline Scored 65% on abbreviated ABC-6 scale on 08/12/2020;    Time 8    Period Weeks    Status New    Target Date 10/08/20                 Plan - 08/19/20 1511     Clinical  Impression Statement Patient returns to clinic reporting that he was able to do home exercise program at reduced frequency and that this reproduces patient's symptoms.  Patient to continue VOR x1 in sitting with 30 second reps 3 times a day for HEP.  Patient with negative right and left Dix-Hallpike and horizontal canal testing and all were negative with no nystagmus observed and patient denied vertigo and dizziness symptoms.  Performed modified L CTSIB test this date and noted +3 sway with eyes closed on Airex pad.  Patient challenged by feet together and semitandem progressions on Airex pad with horizontal and diagonal head turns this date.  In addition patient challenged by retroambulation while scanning for targets in hallway.  Added feet together and semitandem feet progressions with horizontal head turns for 1 minute reps to home exercise program.  Consider trying body wall rolls next session and progressing to diagonal head turns and body turns per patient tolerance.  Patient would benefit from continued PT services to try to reduce patient's subjective symptoms of dizziness and to address goals as stated on plan of care.    Personal Factors and Comorbidities Comorbidity 3+;Past/Current Experience;Time since onset of injury/illness/exacerbation    Comorbidities Colon cancer with chemotherapy and radiation, hypertension, hypothyroidism, migraines, Mnire's, anxiety/depression    Examination-Participation Restrictions Other;Driving   Travel, riding in cars   Stability/Clinical Decision Making Unstable/Unpredictable    Rehab Potential Fair    PT Frequency 1x / week    PT Duration 8 weeks    PT Treatment/Interventions Canalith Repostioning;Balance training;Neuromuscular re-education;Patient/family education;Vestibular;Dry needling;Spinal Manipulations;Manual techniques;Therapeutic exercise;Therapeutic activities;Gait training    PT Next Visit Plan Review VOR x1 and progress as tolerated,  perform activities in standing with head and body turns on uneven surfaces    PT Home Exercise Plan VOR x1 in sitting 30 seconds reps           Patient will benefit from skilled therapeutic intervention in order to improve the following deficits and impairments:  Decreased balance, Dizziness, Pain  Visit Diagnosis: Dizziness and giddiness     Problem List Patient Active Problem List   Diagnosis Date Noted  . Arachnoid cyst 07/10/2020  . Genetic testing 11/23/2018  . Port-A-Cath in place 11/16/2018  . Family history of breast cancer   . Family history of melanoma   . Cancer of sigmoid colon ([place]) 08/07/2018  . 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 Rectal bleeding 06/14/2018  . Change in bowel function 06/14/2018  . Abnormal TSH 03/07/2013  . Abdominal pain, epigastric 03/06/2013  . Nausea alone 03/06/2013  . Bloating 03/06/2013  . Dizziness 08/02/2012  . Heart murmur 08/02/2012  . Migraine headache without aura 10/19/2011  . HYPERLIPIDEMIA 07/07/2010  . Essential hypertension, benign 07/07/2010  . GERD 07/07/2010   Lady Deutscher PT, DPT [phone] Lady Deutscher 08/19/2020, 3:16 PM  [place] [facility] [address] [place], [place], [address] Phone: [phone]   Fax:  [phone]  Name: Sayre Witherington MRN: [id number] Date of Birth: 26-Jan-1981

## 2020-08-26 ENCOUNTER — Other Ambulatory Visit: Payer: Self-pay

## 2020-08-26 ENCOUNTER — Ambulatory Visit: Payer: Commercial Managed Care - PPO | Attending: Internal Medicine | Admitting: Physical Therapy

## 2020-08-26 ENCOUNTER — Encounter: Payer: Self-pay | Admitting: Physical Therapy

## 2020-08-26 DIAGNOSIS — R42 Dizziness and giddiness: Secondary | ICD-10-CM | POA: Insufficient documentation

## 2020-08-26 NOTE — Therapy (Signed)
[facility] [address] [place], [place], [address] Phone: [phone]   Fax:  [phone]  Physical Therapy Treatment  Patient Details  Name: Jacob Hayes MRN: [id number] Date of Birth: [birth year] Referring Provider (PT): Dr. Cecil Cobbs   Encounter Date: 08/26/2020   PT End of Session - 08/26/20 1458    Visit Number 3    Number of Visits 9    Date for PT Re-Evaluation 10/08/20    PT Start Time 5003    PT Stop Time 1448    PT Time Calculation (min) 55 min    Equipment Utilized During Treatment Gait belt    Activity Tolerance Patient tolerated treatment well    Behavior During Therapy [facility] for tasks assessed/performed           Past Medical History:  Diagnosis Date  . Allergic rhinitis   . Arachnoid cyst    chronic and stable per oncology note pt was had work-up at [facility]  . Bruxism   . 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 Essential hypertension, benign    followed by pcp  (pt had ETT done 08-03-2012 in epic, no ischemia)  . Family history of breast cancer   . Family history of melanoma   . GERD   . Hyperlipidemia    Since age 46  . Hypothyroidism, postradioiodine therapy    followed by pcp---  s/p  RAI 2014  . Meniere's disease   . Migraine   . PONV (postoperative nausea and vomiting)     Past Surgical History:  Procedure Laterality Date  . BIOPSY N/A 03/20/2013   Procedure: BIOPSY;  Surgeon: [name], MD;  [facility]: [facility];  Service: Endoscopy;  Laterality: N/A;  Possible small bowel biopsy  . BIOPSY  07/05/2018   Procedure: BIOPSY;  Surgeon: [name], MD;  [facility];  Service: Endoscopy;;  rectal mass  . COLONOSCOPY WITH PROPOFOL N/A 07/05/2018   Procedure: COLONOSCOPY WITH PROPOFOL;  Surgeon: [name], MD;  [facility];  Service: Endoscopy;  Laterality: N/A;  8:30am  . COLONOSCOPY WITH PROPOFOL N/A 07/25/2019   Procedure: COLONOSCOPY WITH PROPOFOL;  Surgeon: [name], MD;  [facility];  Service: Endoscopy;  Laterality: N/A;  2:15pm  . ESOPHAGOGASTRODUODENOSCOPY N/A 03/20/2013   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  One slightly nodular.  Small hiatal hernia.  Multiple fundic gland appearing polyps. no evidence of celiac disease, malignancy, h.pylori, barrett's.   Marland Kitchen POLYPECTOMY  07/05/2018   Procedure: POLYPECTOMY;  Surgeon: [name], MD;  [facility]: [facility];  Service: Endoscopy;;  hepatic flexurex2;  . PORT-A-CATH REMOVAL N/A 07/31/2020   Procedure: REMOVAL PORT-A-CATH;  Surgeon: [name], MD;  Location: [facility];  Service: General;  Laterality: N/A;  . PORTACATH PLACEMENT N/A 08/23/2018   Procedure: INSERTION PORT-A-CATH;  Surgeon: [name], MD;  Location: [facility];  Service: General;  Laterality: N/A;  LMA  . XI ROBOTIC ASSISTED LOWER ANTERIOR RESECTION  08-07-2018   [name]  @[facility]     There were no vitals filed for this visit.   Subjective Assessment - 08/26/20 1354    Subjective Patient states that he is going for the occipital  nerve block on Friday. Patient reports that he had a head cold last week so his ears got stuffed up and he was more sensitive to motion. Patient reports he has not had any headaches this past week. Patient reports he is getting bilateral "cracking" sound in his ears when he turns his head.    Pertinent History Patient has had longstanding intermittent dizziness issues for 10 years or more, but it has worsened since chemotherapy.  Patient reports that he constantly feels like he is "on a boat all the time". Patient has a history of motion sickness since childhood, but this worsened as well.  Patient reports that he  will get motion sickness with nausea now when he drives a car as well as when he is in a passenger.  Patient reports that he restricts his travel because of the increase in his dizziness and motion sickness symptoms.  Patient reports that prior to Covid he frequently traveled for his work.  Patient has a 10-year history or more of migraines which increased when he started chemotherapy in 2019.  In addition per medical record, patient developed severe neuropathic symptoms primarily in his hands which have mostly resolved.  Patient reports that he also had bilateral feet numbness after chemotherapy and then pins-and-needles which have also resolved. Patient reports that he had a work-up performed at [facility] neurology including an EEG.  Patient states he was started on Topamax as he reports that they thought that he was having vestibular migraines.  Patient recently has been seen by neurology and reports that he was diagnosed with occipital neuralgia and was referred to [place] [facility] for a greater occipital nerve block for headaches. Patient reports that he has a stiff neck and neck pain every few days now. Patient reports that he has an appointment tomorrow to receive the occipital nerve block.  Patient reports that activities that increase his dizziness include motion, riding in cars, watching video games, sudden head movements and body turns.  Patient reports nothing eases his dizziness.  Patient reports medications such as Valium, meclizine help to manage his symptoms, but it does not prevent them from occurring or from escalating his symptoms.  Patient reports that he has had no prior physical therapy for neck or vestibular issues and patient reports he has not been seen by an ear nose and throat physician in regards to his vestibular issues.    Diagnostic tests CT head on 06/23/2020: No evidence of acute intracranial abnormality or intracranial metastatic disease.  Posterior fossa arachnoid cyst  unchanged as compared to MRI on 08/29/2013    Patient Stated Goals To have decreased dizziness and decreased motion sensitivity           Neuromuscular Re-education:  VOR X 1 exercise:  Patient reports that he has been better able to tolerate VOR x1 after cues to decrease head speed this past week while performing for home exercise program. Patient performed VOR X 1 horizontal in standing 1 rep of 30 seconds and then 2 reps of 1 minute each.  Demonstrates good technique.   Patient reports 5/10 dizziness with VOR x1. Added VOR x1 in standing with 1 minute reps progression to home exercise program.  Body Wall Rolls:  Patient performed 4 reps of supported, body wall rolls with eyes open with supervision.  Noted mild imbalance. Patient reports increased dizziness with this activity.  Added body wall rolls for home exercise program   Ball tracking: Patient performed static stand while holding ball  while moving ball and progressively increasing clockwise circles while tracking ball with head and eyes. Added this exercise to HEP and handout provided. Patient reports 5/10 dizziness with this activity.  Perform static standing while holding ball with moving ball horizontally while tracking with head and eyes and patient denied dizziness with this activity.  Ambulation with head turns:  Patient performed 175' trials of forwards and retro ambulation with diagonal head turns with CGA.  Patient demonstrates mild veering with retro ambulation with head turns. Patient reports 4/10 dizziness with forward ambulation and 6/10 with retroambulation with head turns.  Airex pad:  On Airex pad, patient performed feet together and semi-tandem progressions with alternating lead leg with body turns with diagonal head turns and then performed body turns with eyes open and eyes closed with contact-guard assist.  Patient utilizing ankle strategies and was challenged by activities on Airex pad especially with eyes  closed. Patient reports increase in dizziness with these activities, especially with semitandem body turns with eyes closed as patient reported 7/10 dizziness.     PT Education - 08/26/20 1602    Education Details Reviewed VOR x1 and progress to standing with 1 minute reps with plain background.  Added supported body wall rolls with eyes open and ball circles with head and eye follows to home exercise program with handout provided from vestibular PowerPoint.    Person(s) Educated Patient    Methods Explanation;Demonstration;Verbal cues;Handout    Comprehension Verbalized understanding;Returned demonstration            PT Short Term Goals - 08/13/20 1556      PT SHORT TERM GOAL #1   Title Patient will be independent with home exercise program in order to decrease dizziness symptoms and improve balance in order to improve function at home and work.    Time 4    Period Weeks    Status New    Target Date 09/10/20             PT Long Term Goals - 08/13/20 1557      PT LONG TERM GOAL #1   Title Patient will have a decrease in dizziness handicap inventory score by 10 points or more to indicate decreased dizziness symptoms.    Baseline Scored 38/100 on 08/12/2020;    Time 8    Period Weeks    Status New    Target Date 10/08/20      PT LONG TERM GOAL #2   Title Patient will report 50% or greater improvement in his symptoms of dizziness with provoking motions or positions.    Time 8    Period Weeks    Status New    Target Date 10/08/20      PT LONG TERM GOAL #3   Title Patient will score 75% or greater on the abbreviated ABC-6 scale in order to demonstrate improvement in patient's balance for safety with community activities.    Baseline Scored 65% on abbreviated ABC-6 scale on 08/12/2020;    Time 8    Period Weeks    Status New    Target Date 10/08/20                 Plan - 08/26/20 1618    Clinical Impression Statement Patient reports compliance with home  exercise program and that exercises reproduce patient's dizziness symptoms.  Worked on progressions of VOR x1 and patient was able to progress to standing 1 minute repetitions on plain background.  Patient challenged by body wall  rolls, semitandem stance on Airex pad with eyes closed with and without body turns, and retroambulation with diagonal head turns.  Added body wall rolls and ball follows in standing to home exercise program.  Next session plan to try at ball toss over shoulder with retro and forward ambulation, body wall rolls with eyes closed and VOR x1 with conflicting background as tolerated.  Patient would benefit from continued vestibular PT services to to address goals and to try to reduce patient's symptoms of dizziness.    Personal Factors and Comorbidities Comorbidity 3+;Past/Current Experience;Time since onset of injury/illness/exacerbation    Comorbidities Colon cancer with chemotherapy and radiation, hypertension, hypothyroidism, migraines, Mnire's, anxiety/depression    Examination-Participation Restrictions Other;Driving   Travel, riding in cars   Stability/Clinical Decision Making Unstable/Unpredictable    Rehab Potential Fair    PT Frequency 1x / week    PT Duration 8 weeks    PT Treatment/Interventions Canalith Repostioning;Balance training;Neuromuscular re-education;Patient/family education;Vestibular;Dry needling;Spinal Manipulations;Manual techniques;Therapeutic exercise;Therapeutic activities;Gait training    PT Next Visit Plan Review VOR x1 and progress as tolerated, perform activities in standing with head and body turns on uneven surfaces    PT Home Exercise Plan VOR x1 in sitting 30 seconds reps           Patient will benefit from skilled therapeutic intervention in order to improve the following deficits and impairments:  Decreased balance, Dizziness, Pain  Visit Diagnosis: Dizziness and giddiness     Problem List Patient Active Problem List   Diagnosis  Date Noted  . Arachnoid cyst 07/10/2020  . Genetic testing 11/23/2018  . Port-A-Cath in place 11/16/2018  . Family history of breast cancer   . Family history of melanoma   . Cancer of sigmoid colon ([place]) 08/07/2018  . 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 Rectal bleeding 06/14/2018  . Change in bowel function 06/14/2018  . Abnormal TSH 03/07/2013  . Abdominal pain, epigastric 03/06/2013  . Nausea alone 03/06/2013  . Bloating 03/06/2013  . Dizziness 08/02/2012  . Heart murmur 08/02/2012  . Migraine headache without aura 10/19/2011  . HYPERLIPIDEMIA 07/07/2010  . Essential hypertension, benign 07/07/2010  . GERD 07/07/2010   Lady Deutscher PT, DPT [phone] Lady Deutscher 08/26/2020, 4:26 PM  [facility] [address] [place], [place], [address] Phone: [phone]   Fax:  [phone]  Name: Jacob Hayes MRN: [id number] Date of Birth: 04/17/1981

## 2020-09-02 ENCOUNTER — Other Ambulatory Visit: Payer: Self-pay

## 2020-09-02 ENCOUNTER — Encounter: Payer: Self-pay | Admitting: Physical Therapy

## 2020-09-02 ENCOUNTER — Ambulatory Visit: Payer: Commercial Managed Care - PPO | Admitting: Physical Therapy

## 2020-09-02 DIAGNOSIS — R42 Dizziness and giddiness: Secondary | ICD-10-CM | POA: Diagnosis not present

## 2020-09-02 NOTE — Therapy (Signed)
[name] [address] [place], [place], [address] Phone: [phone]   Fax:  [phone]  Physical Therapy Treatment  Patient Details  Name: Jacob Hayes MRN: [id number] Date of Birth: 1981/03/25 Referring Provider (PT): Dr. Cecil Cobbs   Encounter Date: 09/02/2020   PT End of Session - 09/02/20 1554    Visit Number 4    Number of Visits 9    Date for PT Re-Evaluation 10/08/20    PT Start Time 7622    PT Stop Time 1449    PT Time Calculation (min) 50 min    Equipment Utilized During Treatment Gait belt    Activity Tolerance Patient tolerated treatment well    Behavior During Therapy [facility] for tasks assessed/performed           Past Medical History:  Diagnosis Date  . Allergic rhinitis   . Arachnoid cyst    chronic and stable per oncology note pt was had work-up at [facility]  . Bruxism   . 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 Essential hypertension, benign    followed by pcp  (pt had ETT done 08-03-2012 in epic, no ischemia)  . Family history of breast cancer   . Family history of melanoma   . GERD   . Hyperlipidemia    Since age 78  . Hypothyroidism, postradioiodine therapy    followed by pcp---  s/p  RAI 2014  . Meniere's disease   . Migraine   . PONV (postoperative nausea and vomiting)     Past Surgical History:  Procedure Laterality Date  . BIOPSY N/A 03/20/2013   Procedure: BIOPSY;  Surgeon: [name], MD;  [facility]: [facility];  Service: Endoscopy;  Laterality: N/A;  Possible small bowel biopsy  . BIOPSY  07/05/2018   Procedure: BIOPSY;  Surgeon: [name], MD;  [facility]: [facility];  Service: Endoscopy;;  rectal mass  . COLONOSCOPY WITH PROPOFOL N/A 07/05/2018   Procedure: COLONOSCOPY WITH PROPOFOL;  Surgeon: [name], MD;  [facility];  Service: Endoscopy;  Laterality: N/A;  8:30am  . COLONOSCOPY WITH PROPOFOL N/A 07/25/2019   Procedure: COLONOSCOPY WITH PROPOFOL;  Surgeon: [name], MD;  [facility];  Service: Endoscopy;  Laterality: N/A;  2:15pm  . ESOPHAGOGASTRODUODENOSCOPY N/A 03/20/2013   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  One slightly nodular.  Small hiatal hernia.  Multiple fundic gland appearing polyps. no evidence of celiac disease, malignancy, h.pylori, barrett's.   Marland Kitchen POLYPECTOMY  07/05/2018   Procedure: POLYPECTOMY;  Surgeon: [name], MD;  [facility]: [facility];  Service: Endoscopy;;  hepatic flexurex2;  . PORT-A-CATH REMOVAL N/A 07/31/2020   Procedure: REMOVAL PORT-A-CATH;  Surgeon: [name], MD;  Location: [facility];  Service: General;  Laterality: N/A;  . PORTACATH PLACEMENT N/A 08/23/2018   Procedure: INSERTION PORT-A-CATH;  Surgeon: [name], MD;  Location: [facility];  Service: General;  Laterality: N/A;  LMA  . XI ROBOTIC ASSISTED LOWER ANTERIOR RESECTION  08-07-2018   [name]  @[facility]     There were no vitals filed for this visit.   Subjective Assessment - 09/02/20 1401    Subjective Patient reports that he had the neck injection last  week. Patient reports he got 8 shots-two at base of head, top of shoulder, mid scapula and base of scapula. Patient states he has not had neck pain in a couple of weeks, but states he has not been sore and no neck pain since the injections. Patient reports that he had a mild headache start on Monday that he reports could have been stress related and was by his eyes.    Pertinent History Patient has had longstanding intermittent dizziness issues for 10 years or more, but it has worsened since chemotherapy.  Patient reports that he constantly feels like he is "on a boat all the time". Patient has a history of motion  sickness since childhood, but this worsened as well.  Patient reports that he will get motion sickness with nausea now when he drives a car as well as when he is in a passenger.  Patient reports that he restricts his travel because of the increase in his dizziness and motion sickness symptoms.  Patient reports that prior to Covid he frequently traveled for his work.  Patient has a 10-year history or more of migraines which increased when he started chemotherapy in 2019.  In addition per medical record, patient developed severe neuropathic symptoms primarily in his hands which have mostly resolved.  Patient reports that he also had bilateral feet numbness after chemotherapy and then pins-and-needles which have also resolved. Patient reports that he had a work-up performed at [facility] neurology including an EEG.  Patient states he was started on Topamax as he reports that they thought that he was having vestibular migraines.  Patient recently has been seen by neurology and reports that he was diagnosed with occipital neuralgia and was referred to [place] [facility] for a greater occipital nerve block for headaches. Patient reports that he has a stiff neck and neck pain every few days now. Patient reports that he has an appointment tomorrow to receive the occipital nerve block.  Patient reports that activities that increase his dizziness include motion, riding in cars, watching video games, sudden head movements and body turns.  Patient reports nothing eases his dizziness.  Patient reports medications such as Valium, meclizine help to manage his symptoms, but it does not prevent them from occurring or from escalating his symptoms.  Patient reports that he has had no prior physical therapy for neck or vestibular issues and patient reports he has not been seen by an ear nose and throat physician in regards to his vestibular issues.    Diagnostic tests CT head on 06/23/2020: No evidence of acute intracranial  abnormality or intracranial metastatic disease.  Posterior fossa arachnoid cyst unchanged as compared to MRI on 08/29/2013    Patient Stated Goals To have decreased dizziness and decreased motion sensitivity           Neuromuscular Re-education: Patient reports 3/10 dizziness upon arrival.  VOR X 1 exercise:  Patient performed VOR X 1 horizontal in standing with conflicting background 1 rep of 1 minute and then 2 reps of 30 seconds each. Patient reports 6-7/10 dizziness after 1 minute rep with low light environment and reports 4-5/10 dizziness after 30 seconds reps in room light. Added progression of standing VOR x1 with conflicting background for 30 seconds reps to home exercise program.  Body Wall Rolls:  Patient performed 4 reps of unsupported, body wall rolls with eyes open.  Patient performed 2 reps of supported, body wall rolls with eyes closed. Patient reports the eyes closed progression recreated the  sensation that he gets of rocking and being on a boat and he rated his dizziness as 6/10. Added supported, body wall rolls with eyes opened and then with eyes closed 2 reps each multiple times per day for home exercise program.  Handout provided.  Ball toss over shoulder: Patient performed multiple 175' trials of forward ambulation while tossing ball over one shoulder with return catch over opposite shoulder with CGA.  Patient reports increase in dizziness with this activity.  Airex balance beam: On Airex balance beam, performed sideways stepping with horizontal head turns with eyes opened 5' times multiple reps. On Airex balance beam, performed sideways stepping with vertical head turns with eyes open 5' times multiple reps. Attempted eyes closed activities on Airex balance beam, but this created increased dizziness and imbalance and therefore changed to firm surface. Patient performed sidestepping with horizontal and vertical head turns with eyes opened and then with eyes closed 6  feet to 8 feet times multiple reps. Patient required contact-guard assist with above activities. Patient reports that the most exacerbating activity for his symptoms was the eyes closed with horizontal head turns on firm surface. Patient reports 5-6/10 dizziness with the above activities with head turns.     PT Education - 09/02/20 1552    Education Details Reviewed body wall rolls supported with eyes open and with eyes closed and added conflicting background VOR X 1 30 second reps progressions for home exercise program.    Person(s) Educated Patient    Methods Explanation;Demonstration;Handout;Verbal cues    Comprehension Returned demonstration;Verbalized understanding            PT Short Term Goals - 08/13/20 1556      PT SHORT TERM GOAL #1   Title Patient will be independent with home exercise program in order to decrease dizziness symptoms and improve balance in order to improve function at home and work.    Time 4    Period Weeks    Status New    Target Date 09/10/20             PT Long Term Goals - 08/13/20 1557      PT LONG TERM GOAL #1   Title Patient will have a decrease in dizziness handicap inventory score by 10 points or more to indicate decreased dizziness symptoms.    Baseline Scored 38/100 on 08/12/2020;    Time 8    Period Weeks    Status New    Target Date 10/08/20      PT LONG TERM GOAL #2   Title Patient will report 50% or greater improvement in his symptoms of dizziness with provoking motions or positions.    Time 8    Period Weeks    Status New    Target Date 10/08/20      PT LONG TERM GOAL #3   Title Patient will score 75% or greater on the abbreviated ABC-6 scale in order to demonstrate improvement in patient's balance for safety with community activities.    Baseline Scored 65% on abbreviated ABC-6 scale on 08/12/2020;    Time 8    Period Weeks    Status New    Target Date 10/08/20                 Plan - 09/02/20 1609    Clinical  Impression Statement Patient able to progress to standing VOR X 1 with conflicting background, but with 30 second reps. Patient reports reproduction of the sensation that he gets  of rocking/sensation of being like on a boat when performing supported body wall rolls with eyes closed. Patient challenged by sidestepping with head turns especially with eyes closed on firm and with eyes open on Airex balance beam.  Patient reported moderate dizziness with all activities in the clinic this date.  Will continue to work on progressions of these activity.  Next session, will try ambulation with ball toss over shoulder at varying heights, unsupported body wall rolls with eyes closed, Airex balance beam sidestepping and forward tandem with head turns eyes open and eyes closed.  Patient would benefit from continued PT services to address functional deficits and goals.    Personal Factors and Comorbidities Comorbidity 3+;Past/Current Experience;Time since onset of injury/illness/exacerbation    Comorbidities Colon cancer with chemotherapy and radiation, hypertension, hypothyroidism, migraines, Mnire's, anxiety/depression    Examination-Participation Restrictions Other;Driving   Travel, riding in cars   Stability/Clinical Decision Making Unstable/Unpredictable    Rehab Potential Fair    PT Frequency 1x / week    PT Duration 8 weeks    PT Treatment/Interventions Canalith Repostioning;Balance training;Neuromuscular re-education;Patient/family education;Vestibular;Dry needling;Spinal Manipulations;Manual techniques;Therapeutic exercise;Therapeutic activities;Gait training    PT Next Visit Plan Review VOR x1 and progress as tolerated, perform activities in standing with head and body turns on uneven surfaces    PT Home Exercise Plan VOR x1 in sitting 30 seconds reps           Patient will benefit from skilled therapeutic intervention in order to improve the following deficits and impairments:  Decreased balance,  Dizziness, Pain  Visit Diagnosis: Dizziness and giddiness     Problem List Patient Active Problem List   Diagnosis Date Noted  . Arachnoid cyst 07/10/2020  . Genetic testing 11/23/2018  . Port-A-Cath in place 11/16/2018  . Family history of breast cancer   . Family history of melanoma   . Cancer of sigmoid colon ([place]) 08/07/2018  . 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 Rectal bleeding 06/14/2018  . Change in bowel function 06/14/2018  . Abnormal TSH 03/07/2013  . Abdominal pain, epigastric 03/06/2013  . Nausea alone 03/06/2013  . Bloating 03/06/2013  . Dizziness 08/02/2012  . Heart murmur 08/02/2012  . Migraine headache without aura 10/19/2011  . HYPERLIPIDEMIA 07/07/2010  . Essential hypertension, benign 07/07/2010  . GERD 07/07/2010   Lady Deutscher PT, DPT [phone] Lady Deutscher 09/02/2020, 4:19 PM  [facility] [address] [place], [place], [address] Phone: [phone]   Fax:  [phone]  Name: Jacob Hayes MRN: [id number] Date of Birth: [birth year]

## 2020-09-08 ENCOUNTER — Ambulatory Visit: Payer: Commercial Managed Care - PPO | Admitting: Internal Medicine

## 2020-09-09 ENCOUNTER — Encounter: Payer: Self-pay | Admitting: Physical Therapy

## 2020-09-09 ENCOUNTER — Ambulatory Visit: Payer: Commercial Managed Care - PPO

## 2020-09-11 ENCOUNTER — Ambulatory Visit: Payer: Commercial Managed Care - PPO | Admitting: Internal Medicine

## 2020-09-16 ENCOUNTER — Other Ambulatory Visit: Payer: Self-pay

## 2020-09-16 ENCOUNTER — Ambulatory Visit: Payer: Commercial Managed Care - PPO | Admitting: Physical Therapy

## 2020-09-16 ENCOUNTER — Encounter: Payer: Self-pay | Admitting: Physical Therapy

## 2020-09-16 DIAGNOSIS — R42 Dizziness and giddiness: Secondary | ICD-10-CM

## 2020-09-16 NOTE — Therapy (Signed)
[facility] [address] [place], [place], [address] Phone: [phone]   Fax:  [phone]  Physical Therapy Treatment  Patient Details  Name: Jacob Hayes MRN: [id number] Date of Birth: September 10, 1981 Referring Provider (PT): Dr. Cecil Cobbs   Encounter Date: 09/16/2020   PT End of Session - 09/16/20 1407    Visit Number 5    Number of Visits 9    Date for PT Re-Evaluation 10/08/20    PT Start Time 9563    PT Stop Time 1446    PT Time Calculation (min) 49 min    Equipment Utilized During Treatment Gait belt    Activity Tolerance Patient tolerated treatment well    Behavior During Therapy [facility] for tasks assessed/performed           Past Medical History:  Diagnosis Date  . Allergic rhinitis   . Arachnoid cyst    chronic and stable per oncology note pt was had work-up at [facility]  . Bruxism   . 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 Essential hypertension, benign    followed by pcp  (pt had ETT done 08-03-2012 in epic, no ischemia)  . Family history of breast cancer   . Family history of melanoma   . GERD   . Hyperlipidemia    Since age 72  . Hypothyroidism, postradioiodine therapy    followed by pcp---  s/p  RAI 2014  . Meniere's disease   . Migraine   . PONV (postoperative nausea and vomiting)     Past Surgical History:  Procedure Laterality Date  . BIOPSY N/A 03/20/2013   Procedure: BIOPSY;  Surgeon: [name], MD;  [facility];  Service: Endoscopy;  Laterality: N/A;  Possible small bowel biopsy  . BIOPSY  07/05/2018   Procedure: BIOPSY;  Surgeon: [name], MD;  [facility];  Service: Endoscopy;;  rectal mass  . COLONOSCOPY WITH PROPOFOL N/A 07/05/2018   Procedure: COLONOSCOPY WITH PROPOFOL;  Surgeon: [name], MD;  [facility];  Service: Endoscopy;  Laterality: N/A;  8:30am  . COLONOSCOPY WITH PROPOFOL N/A 07/25/2019   Procedure: COLONOSCOPY WITH PROPOFOL;  Surgeon: [name], MD;  [facility];  Service: Endoscopy;  Laterality: N/A;  2:15pm  . ESOPHAGOGASTRODUODENOSCOPY N/A 03/20/2013   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  One slightly nodular.  Small hiatal hernia.  Multiple fundic gland appearing polyps. no evidence of celiac disease, malignancy, h.pylori, barrett's.   Marland Kitchen POLYPECTOMY  07/05/2018   Procedure: POLYPECTOMY;  Surgeon: [name], MD;  [facility]: [facility];  Service: Endoscopy;;  hepatic flexurex2;  . PORT-A-CATH REMOVAL N/A 07/31/2020   Procedure: REMOVAL PORT-A-CATH;  Surgeon: [name], MD;  Location: [facility];  Service: General;  Laterality: N/A;  . PORTACATH PLACEMENT N/A 08/23/2018   Procedure: INSERTION PORT-A-CATH;  Surgeon: [name], MD;  Location: [facility];  Service: General;  Laterality: N/A;  LMA  . XI ROBOTIC ASSISTED LOWER ANTERIOR RESECTION  08-07-2018   [name]  @[facility]     There were no vitals filed for this visit.   Subjective Assessment - 09/16/20 1402    Subjective Patient states he has not had any neck pain  since the injections. Patient states that he does feel a pressure in the neck area, but no soreness or stiffness. Patient states he see Dr. Warren Lacy for a neuro follow-up on Friday. Patient reports his dizziness symptoms have been pretty good except he did notice when the weather was cold and rainy an increase. Patient also said there was one day last week where he felt out of it, floating sensation, lethargy and drained; states he had worked 90 hours last week and had increased stress.    Pertinent History Patient has had longstanding intermittent dizziness issues for 10 years or more, but it has worsened since chemotherapy.   Patient reports that he constantly feels like he is "on a boat all the time". Patient has a history of motion sickness since childhood, but this worsened as well.  Patient reports that he will get motion sickness with nausea now when he drives a car as well as when he is in a passenger.  Patient reports that he restricts his travel because of the increase in his dizziness and motion sickness symptoms.  Patient reports that prior to Covid he frequently traveled for his work.  Patient has a 10-year history or more of migraines which increased when he started chemotherapy in 2019.  In addition per medical record, patient developed severe neuropathic symptoms primarily in his hands which have mostly resolved.  Patient reports that he also had bilateral feet numbness after chemotherapy and then pins-and-needles which have also resolved. Patient reports that he had a work-up performed at [facility] neurology including an EEG.  Patient states he was started on Topamax as he reports that they thought that he was having vestibular migraines.  Patient recently has been seen by neurology and reports that he was diagnosed with occipital neuralgia and was referred to [place] [facility] for a greater occipital nerve block for headaches. Patient reports that he has a stiff neck and neck pain every few days now. Patient reports that he has an appointment tomorrow to receive the occipital nerve block.  Patient reports that activities that increase his dizziness include motion, riding in cars, watching video games, sudden head movements and body turns.  Patient reports nothing eases his dizziness.  Patient reports medications such as Valium, meclizine help to manage his symptoms, but it does not prevent them from occurring or from escalating his symptoms.  Patient reports that he has had no prior physical therapy for neck or vestibular issues and patient reports he has not been seen by an ear nose and throat physician in  regards to his vestibular issues.    Diagnostic tests CT head on 06/23/2020: No evidence of acute intracranial abnormality or intracranial metastatic disease.  Posterior fossa arachnoid cyst unchanged as compared to MRI on 08/29/2013    Patient Stated Goals To have decreased dizziness and decreased motion sensitivity           Neuromuscular Re-education:  VOR X 1 exercise:  Patient performed walking VOR X 1 horizontal with conflicting background multiple reps of 50 feet each.  Patient demonstrates good technique.  Patient denies dizziness with this activity.   Hallway ball toss:  In hallway, worked on ball toss against one wall with alternating 180 degrees quick turns to toss ball against opposite wall while tracking with eyes and head. Patient reports 4/10 dizziness with this activity. Then repeated with 360 degree turns a few reps and patient reported 6/10 dizziness. Added hallway ball toss with alternating 180 degrees quick turns to  home exercise program.  Body Wall Rolls:  Patient performed 5 reps of unsupported, body wall rolls with eyes closed. Patient demonstrated improvements as compared to last session as patient was able to perform with decreased veering and unsteadiness. In addition, patient reported decreased dizziness as compared to last session with this activity.  Patient rated dizziness as 3-4/10.  Airex balance beam: On Airex balance beam, patient performed sideways static stance holds with horizontal head turns with and without eyes closed multiple reps. On Airex balance beam, patient performed sideways static stance holds with body turns. On Airex balance beam, patient performed semitandem stance static holds with horizontal head turns with and without eyes closed multiple reps. On Airex balance beam, patient performed sideways stepping with horizontal head turns 5 feet times multiple reps. On Airex balance beam, patient performed tandem walking 5' times multiple  reps.  Ball toss over shoulder: Patient performed multiple 175' trials of retro ambulation while tossing ball over one shoulder with return catch over opposite shoulder varying the ball position to head, shoulder and waist level to promote head turning and tilting. Patient reports no dizziness with this activity and demonstrated good balance with no veering.   Patient reports overall reduction in his dizziness symptoms.  Patient motivated to session.  Patient demonstrating improvements in his dizziness symptoms as evidenced by patient able to perform walking VOR x1 with conflicting background without dizziness this date.  In addition, patient demonstrated much better tolerance to all activities on Airex balance beam; however, patient does continue to be challenged by eyes closed and horizontal head turn and body turn activities.  Patient able to progress to unsupported body wall rolls with eyes closed.  Added hallway ball toss with 180 degree alternating turns to home exercise program has this recreated patient's symptoms this date.  Patient would benefit from continued skilled vestibular PT services to further address goals and to try to reduce patient's symptoms.    PT Education - 09/16/20 1449    Education Details discussed plan of care and home exercise program; added hallway ball toss with 180degree turns for home exercise program.    Person(s) Educated Patient    Methods Explanation;Handout;Demonstration    Comprehension Verbalized understanding;Returned demonstration            PT Short Term Goals - 09/17/20 1701      PT SHORT TERM GOAL #1   Title Patient will be independent with home exercise program in order to decrease dizziness symptoms and improve balance in order to improve function at home and work.    Time 4    Period Weeks    Status Achieved    Target Date 09/10/20             PT Long Term Goals - 08/13/20 1557      PT LONG TERM GOAL #1   Title Patient will have a  decrease in dizziness handicap inventory score by 10 points or more to indicate decreased dizziness symptoms.    Baseline Scored 38/100 on 08/12/2020;    Time 8    Period Weeks    Status New    Target Date 10/08/20      PT LONG TERM GOAL #2   Title Patient will report 50% or greater improvement in his symptoms of dizziness with provoking motions or positions.    Time 8    Period Weeks    Status New    Target Date 10/08/20      PT LONG TERM  GOAL #3   Title Patient will score 75% or greater on the abbreviated ABC-6 scale in order to demonstrate improvement in patient's balance for safety with community activities.    Baseline Scored 65% on abbreviated ABC-6 scale on 08/12/2020;    Time 8    Period Weeks    Status New    Target Date 10/08/20                 Plan - 09/17/20 1712    Clinical Impression Statement Patient reports overall reduction in his dizziness symptoms.  Patient motivated to session.  Patient demonstrating improvements in his dizziness symptoms as evidenced by patient able to perform walking VOR x1 with conflicting background without dizziness this date.  In addition, patient demonstrated much better tolerance to all activities on Airex balance beam; however, patient does continue to be challenged by eyes closed and horizontal head turn and body turn activities.  Patient able to progress to unsupported body wall rolls with eyes closed.  Added hallway ball toss with 180 degree alternating turns to home exercise program has this recreated patient's symptoms this date.  Patient would benefit from continued skilled vestibular PT services to further address goals and to try to reduce patient's symptoms.    Personal Factors and Comorbidities Comorbidity 3+;Past/Current Experience;Time since onset of injury/illness/exacerbation    Comorbidities Colon cancer with chemotherapy and radiation, hypertension, hypothyroidism, migraines, Mnire's, anxiety/depression     Examination-Participation Restrictions Other;Driving   Travel, riding in cars   Stability/Clinical Decision Making Unstable/Unpredictable    Rehab Potential Fair    PT Frequency 1x / week    PT Duration 8 weeks    PT Treatment/Interventions Canalith Repostioning;Balance training;Neuromuscular re-education;Patient/family education;Vestibular;Dry needling;Spinal Manipulations;Manual techniques;Therapeutic exercise;Therapeutic activities;Gait training    PT Next Visit Plan Review VOR x1 and progress as tolerated, perform activities in standing with head and body turns on uneven surfaces    PT Home Exercise Plan VOR x1 in sitting 30 seconds reps           Patient will benefit from skilled therapeutic intervention in order to improve the following deficits and impairments:  Decreased balance, Dizziness, Pain  Visit Diagnosis: Dizziness and giddiness     Problem List Patient Active Problem List   Diagnosis Date Noted  . Arachnoid cyst 07/10/2020  . Genetic testing 11/23/2018  . Port-A-Cath in place 11/16/2018  . Family history of breast cancer   . Family history of melanoma   . Cancer of sigmoid colon ([place]) 08/07/2018  . 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 Rectal bleeding 06/14/2018  . Change in bowel function 06/14/2018  . Abnormal TSH 03/07/2013  . Abdominal pain, epigastric 03/06/2013  . Nausea alone 03/06/2013  . Bloating 03/06/2013  . Dizziness 08/02/2012  . Heart murmur 08/02/2012  . Migraine headache without aura 10/19/2011  . HYPERLIPIDEMIA 07/07/2010  . Essential hypertension, benign 07/07/2010  . GERD 07/07/2010   Lady Deutscher PT, DPT [phone] Lady Deutscher 09/17/2020, 5:12 PM  [facility] [address] [place], [place], [address] Phone: [phone]   Fax:  [phone]  Name: Jacob Hayes MRN: [id number] Date of Birth: 01-25-1981

## 2020-09-18 ENCOUNTER — Other Ambulatory Visit: Payer: Self-pay

## 2020-09-18 ENCOUNTER — Inpatient Hospital Stay: Payer: Commercial Managed Care - PPO | Attending: Hematology | Admitting: Internal Medicine

## 2020-09-18 VITALS — BP 149/89 | HR 85 | Temp 98.2°F | Resp 18 | Ht 75.0 in | Wt 219.8 lb

## 2020-09-18 DIAGNOSIS — R42 Dizziness and giddiness: Secondary | ICD-10-CM | POA: Diagnosis not present

## 2020-09-18 DIAGNOSIS — Z23 Encounter for immunization: Secondary | ICD-10-CM | POA: Diagnosis not present

## 2020-09-18 DIAGNOSIS — G930 Cerebral cysts: Secondary | ICD-10-CM | POA: Diagnosis not present

## 2020-09-18 DIAGNOSIS — G43009 Migraine without aura, not intractable, without status migrainosus: Secondary | ICD-10-CM | POA: Diagnosis not present

## 2020-09-18 DIAGNOSIS — C187 Malignant neoplasm of sigmoid colon: Secondary | ICD-10-CM

## 2020-09-18 MED ORDER — INFLUENZA VAC SPLIT QUAD 0.5 ML IM SUSY
0.5000 mL | PREFILLED_SYRINGE | Freq: Once | INTRAMUSCULAR | Status: AC
  Administered 2020-09-18: 0.5 mL via INTRAMUSCULAR

## 2020-09-18 MED ORDER — INFLUENZA VAC SPLIT QUAD 0.5 ML IM SUSY: 0.5000 mL | PREFILLED_SYRINGE | Freq: Once | INTRAMUSCULAR | Status: AC

## 2020-09-18 MED ORDER — INFLUENZA VAC SPLIT QUAD 0.5 ML IM SUSY: 0.5000 mL | PREFILLED_SYRINGE | Freq: Once | INTRAMUSCULAR | Status: DC

## 2020-09-18 MED ORDER — INFLUENZA VAC SPLIT QUAD 0.5 ML IM SUSY
PREFILLED_SYRINGE | INTRAMUSCULAR | Status: AC
  Filled 2020-09-18: qty 0.5

## 2020-09-18 NOTE — Progress Notes (Signed)
[facility] [place], [place] [address] [phone]   Interval Evaluation  Date of Service: 09/18/20 Patient Name: Jacob Hayes Patient MRN: [id number] Patient DOB: Mar 30, 1981 Provider: Ventura Sellers, MD  Identifying Statement:  Jacob Hayes is a 39 y.o. 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 07/05/18  IMPRESSION - Tumor in the rectum. Biopsied. - Diverticulosis in the entire examined colon. - Two 5 to 6 mm polyps, removed with a cold snare. Resected and retrieved. - The examination was otherwise normal on direct and retroflexion views.    07/05/2018 Initial Biopsy   Diagnosis 07/05/18 1. Colon, polyp(s), hepatic flexure - TUBULAR ADENOMA (ONE). - NO HIGH GRADE DYSPLASIA OR MALIGNANCY. 2. Rectum, biopsy, mass - INVASIVE MODERATELY DIFFERENTIATED COLORECTAL ADENOCARCINOMA.   07/10/2018 Imaging   CT CAP W Constrast 07/10/18  IMPRESSION: 1. Mass within the rectosigmoid colon is identified compatible with findings from recent colonoscopy. 2. No evidence for nodal metastasis or distant metastatic disease. 3. Left kidney cyst.   07/17/2018 Imaging   MRI [place]vis 07/17/18  IMPRESSION: 4.2 cm polypoid lesion along the right lateral aspect of the upper rectum, corresponding to the patient's biopsy-proven rectal adenocarcinoma, as above. Rectal adenocarcinoma T stage:  T1 or T2 Rectal adenocarcinoma N stage:  N0 Distance from  tumor to the anal sphincter is 14 cm.   08/07/2018 Initial Diagnosis   Rectosigmoid cancer ([place])   08/07/2018 Surgery   XI ROBOTIC LOW ANTERIOR RESECTION by Dr. Kieth Brightly and Dr. Marcello Moores    08/07/2018 Pathologic Stage   Diagnosis 08/07/18 1. Colon, segmental resection for tumor, rectosigmoid - INVASIVE MODERATELY DIFFERENTIATED COLORECTAL ADENOCARCINOMA, 3.5 CM. - CARCINOMA FOCALLY INVOLVES PERICOLONIC CONNECTIVE TISSUE. - METASTATIC CARCINOMA IN ONE OF FIFTEEN LYMPH NODES (1/15). - MARGINS NOT INVOLVED. 2. Colon, resection margin (donut), distal ring, sigmoid - BENIGN COLON. - NO EVIDENCE OF MALIGNANCY.   08/07/2018 Cancer Staging   Staging form: Colon and Rectum, AJCC 8th Edition - Pathologic stage from 08/07/2018: Stage IIIB (pT3, pN1a, cM0) - Signed by Truitt Merle, MD on 10/25/2018   08/29/2018 -  Chemotherapy   Adjuvant CAPOX every 3 weeks with Xeloda 2064m twice daily 2 weeks on 1 week off starting 08/29/18. D/c Oxaliplatin after 09/28/18 (cycle 2) due to very poor toleration. Increased to 20073min AM and 250035mn PM starting with cycle 5.  Last 8th cycle dose was taken 02/26/19.    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  The Multi-Gene Panel offered by [facility] includes sequencing and/or deletion duplication testing of the following 84 genes: AIP, ALK, APC, ATM, AXIN2,BAP1,  BARD1, BLM, BMPR1A, BRCA1, BRCA2, BRIP1, CASR, CDC73, CDH1, CDK4, CDKN1B, CDKN1C, CDKN2A (p14ARF), CDKN2A (p16INK4a), CEBPA, CHEK2, CTNNA1, DICER1, DIS3L2, EGFR (c.2369C>T, p.Thr790Met variant only), EPCAM (Deletion/duplication testing only), FH, FLCN, GATA2, GPC3, GREM1 (Promoter region deletion/duplication testing only), HOXB13 (c.251G>A, p.Gly84Glu), HRAS, KIT, MAX, MEN1, MET, MITF (c.952G>A, p.Glu318Lys variant only), MLH1, MSH2, MSH3, MSH6, MUTYH,  NBN, NF1, NF2, NTHL1, PALB2, PDGFRA, PHOX2B, PMS2, POLD1, POLE, POT1, PRKAR1A, PTCH1, PTEN, RAD50, RAD51C, RAD51D,  RB1, RECQL4, RET, RUNX1, SDHAF2, SDHA (sequence changes only), SDHB, SDHC, SDHD, SMAD4, SMARCA4, SMARCB1, SMARCE1, STK11, SUFU, TERC, TERT, TMEM127, TP53, TSC1, TSC2, VHL, WRN and WT1.  The report date is 11/22/2018.   03/20/2019 Imaging   CT CAP 03/20/19  IMPRESSION: 1.  Interval postoperative findings of rectal resection.  2. No definite evidence of metastatic disease in the chest, abdomen, or pelvis.  3. No change in small prominent retroperitoneal lymph nodes. Attention on follow-up.  4. There is a subtle subcapsular hyperdensity of the dome of the liver, likely focal fatty sparing (series 2, image 49). Attention on follow-up.   07/25/2019 Procedure   Colonoscopy by [name] 07/25/19  IMPRESSION - Diverticulosis. Surgical anastomosis at 10 cm. Evidence of neoplasm. - The examination was otherwise normal on direct and retroflexion views. - No specimens collected.   09/20/2019 Imaging   CT AP W Contrast 09/20/19  IMPRESSION: Redemonstrated postoperative findings of rectosigmoid resection and reanastomosis. No evidence of recurrent or metastatic disease in the abdomen or pelvis.   06/23/2020 Imaging   CT CAP W contrast  IMPRESSION: 1. Redemonstrated postoperative findings of rectosigmoid resection and reanastomosis. 2. No evidence of recurrent or metastatic disease in the chest, abdomen, or pelvis.   06/23/2020 Imaging   CT head  IMPRESSION: No evidence of acute intracranial abnormality or intracranial metastatic disease.   1.7 x 2.7 x 3.3 cm posterior fossa arachnoid cyst, unchanged in size as compared to the MRI of 08/29/2013.   Mild ethmoid sinus mucosal thickening.       Interval History:  Jacob Hayes presents today for follow up.  He underwent successful occipital nerve blockade through [place] pain clinic; he has had no actual headaches since block was performed several weeks ago.  Vertigo has improved modestly since he started working with vestibular  rehab program.  He continues to dose valium twice per day.  Stays active at work and with family, has follow up scans and visit with Dr. Burr Medico scheduled for December.    H+P (07/09/20) Patient presents today to review neurologic complaints.  He describes 10 years history of headaches, described as "pain starting in the middle of back of neck, radiating up to back of the head".  This may be preceded by some neck stiffness.  This then evolves into a several hours long full migraine syndrome, with nausea, photophobia.  Frequency and severity increased when he started chemotherapy in 2019 and developed severe neuropathic symptoms primarily in his hands.  This has mostly resolved, although he does feel some fatigue and aching in his hands after a full day of work, increased from prior to cancer treatment.  For headaches he typically doses Aleve but not until several hours of pain.  He also describes moderate waxing and waning vertiginous symptoms that have been long standing and chronic.  Vertigo is described as "feeling like I'm on a ship" rather than room-spinning type. This has been worked up extensively including at [facility], he currently doses Valium 2.$RemoveBefore'5mg'[id number]$  HS for clinical suppression of vertigo symptoms.  Recently underwent brain MRI ordered by Dr. Burr Medico which demonstrated cyst in posterior fossa.  Medications: Current Outpatient Medications on File Prior to Visit  Medication Sig Dispense Refill  . acetaminophen (TYLENOL) 500 MG tablet Take 1,000 mg by mouth every 6 (six) hours as needed for moderate pain or headache.     . Ascorbic Acid (VITAMIN  C) 1000 MG tablet Take 1,000 mg by mouth daily.    Marland Kitchen b complex vitamins capsule Take 1 capsule by mouth daily.    . cetirizine (ZYRTEC) 10 MG tablet Take 10 mg by mouth daily as needed for allergies.     . diazepam (VALIUM) 5 MG tablet Take 2.5 mg by mouth at bedtime.   1  . diphenhydrAMINE (BENADRYL) 25 MG tablet Take 25-50 mg by mouth 2 (two) times daily as needed  for allergies.    . diphenhydramine-acetaminophen (TYLENOL PM) 25-500 MG TABS tablet Take 1 tablet by mouth at bedtime as needed (sleep).    . fluticasone (FLONASE) 50 MCG/ACT nasal spray Place 2 sprays into the nose daily. (Patient taking differently: Place 2 sprays into the nose daily as needed (for seasonal allergies.). ) 16 g 11  . LORazepam (ATIVAN) 1 MG tablet Take 1 mg by mouth every 8 (eight) hours as needed for anxiety.  (Patient not taking: Reported on 08/12/2020)    . losartan-hydrochlorothiazide (HYZAAR) 100-12.5 MG tablet Take 0.5 tablets by mouth 2 (two) times daily.     . Magnesium 400 MG TABS Take 1 tablet by mouth in the morning and at bedtime. Per pt takes for dizziness    . meclizine (ANTIVERT) 25 MG tablet Take 25 mg by mouth 3 (three) times daily as needed for dizziness.    . methimazole (TAPAZOLE) 5 MG tablet Take 5 mg by mouth 2 (two) times daily.     . Multiple Vitamin (MULTIVITAMIN WITH MINERALS) TABS tablet Take 1 tablet by mouth daily. Men's One-A-Day    . pantoprazole (PROTONIX) 40 MG tablet TAKE ONE TABLET BY MOUTH TWICE DAILY BEFORE A MEAL (Patient taking differently: Take 40 mg by mouth daily. ) 60 tablet 0  . simvastatin (ZOCOR) 40 MG tablet Take 40 mg by mouth every evening.      No current facility-administered medications on file prior to visit.    Allergies: No Known Allergies Past Medical History:  Past Medical History:  Diagnosis Date  . Allergic rhinitis   . Arachnoid cyst    chronic and stable per oncology note pt was had work-up at [facility]  . Bruxism   . Colon cancer [facility]) oncologist--- dr Burr Medico   dx 09/ 2019 rectosigmoid cancer, Stage IIIB;  s/p  lower anterior colon resection 08-07-2018 and completed chemo 04/ 2020  . Essential hypertension, benign    followed by pcp  (pt had ETT done 08-03-2012 in epic, no ischemia)  . Family history of breast cancer   . Family history of melanoma   . GERD   . Hyperlipidemia    Since age 38  .  Hypothyroidism, postradioiodine therapy    followed by pcp---  s/p  RAI 2014  . Meniere's disease   . Migraine   . PONV (postoperative nausea and vomiting)    Past Surgical History:  Past Surgical History:  Procedure Laterality Date  . BIOPSY N/A 03/20/2013   Procedure: BIOPSY;  Surgeon: [name], MD;  [facility];  Service: Endoscopy;  Laterality: N/A;  Possible small bowel biopsy  . BIOPSY  07/05/2018   Procedure: BIOPSY;  Surgeon: [name], MD;  [facility];  Service: Endoscopy;;  rectal mass  . COLONOSCOPY WITH PROPOFOL N/A 07/05/2018   Procedure: COLONOSCOPY WITH PROPOFOL;  Surgeon: [name], MD;  [facility];  Service: Endoscopy;  Laterality: N/A;  8:30am  . COLONOSCOPY WITH PROPOFOL N/A 07/25/2019  Procedure: COLONOSCOPY WITH PROPOFOL;  Surgeon: [name], MD;  [facility];  Service: Endoscopy;  Laterality: N/A;  2:15pm  . ESOPHAGOGASTRODUODENOSCOPY N/A 03/20/2013   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  One slightly nodular.  Small hiatal hernia.  Multiple fundic gland appearing polyps. no evidence of celiac disease, malignancy, h.pylori, barrett's.   Marland Kitchen POLYPECTOMY  07/05/2018   Procedure: POLYPECTOMY;  Surgeon: [name], MD;  [facility];  Service: Endoscopy;;  hepatic flexurex2;  . PORT-A-CATH REMOVAL N/A 07/31/2020   Procedure: REMOVAL PORT-A-CATH;  Surgeon: [name], MD;  Location[facility];  Service: General;  Laterality: N/A;  . PORTACATH PLACEMENT N/A 08/23/2018   Procedure: INSERTION PORT-A-CATH;  Surgeon: [name], MD;  Location: [facility];  Service: General;  Laterality: N/A;  LMA  . XI ROBOTIC ASSISTED LOWER ANTERIOR RESECTION  08-07-2018   [name]  _0    Social History:  Social History   Socioeconomic History  . Marital status: Divorced     Spouse name: Not on file  . Number of children: 2  . Years of education: Not on file  . Highest education level: Not on file  Occupational History  . Occupation: works with Sales executive at [employer]  . Smoking status: Never Smoker  . Smokeless tobacco: Never Used  Vaping Use  . Vaping Use: Never used  Substance and Sexual Activity  . Alcohol use: No  . Drug use: Never  . Sexual activity: Yes  Other Topics Concern  . Not on file  Social History Narrative   Unable to ask intimate partner questions.   Social Determinants of Health   Financial Resource Strain:   . Difficulty of Paying Living Expenses: Not on file  Food Insecurity:   . Worried About Charity fundraiser in the Last Year: Not on file  . Ran Out of Food in the Last Year: Not on file  Transportation Needs:   . Lack of Transportation (Medical): Not on file  . Lack of Transportation (Non-Medical): Not on file  Physical Activity:   . Days of Exercise per Week: Not on file  . Minutes of Exercise per Session: Not on file  Stress:   . Feeling of Stress : Not on file  Social Connections:   . Frequency of Communication with Friends and Family: Not on file  . Frequency of Social Gatherings with Friends and Family: Not on file  . Attends Religious Services: Not on file  . Active Member of Clubs or Organizations: Not on file  . Attends Archivist Meetings: Not on file  . Marital Status: Not on file  Intimate Partner Violence:   . Fear of Current or Ex-Partner: Not on file  . Emotionally Abused: Not on file  . Physically Abused: Not on file  . Sexually Abused: Not on file   Family History:  Family History  Problem Relation Age of Onset  . Breast cancer Mother 84  . Thyroid disease Mother        Thyroid removed  . Lung cancer Mother   . Crohn's disease Mother        ???  . Heart disease Father   . Stroke Father 60  . Hyperlipidemia Father   . AAA (abdominal aortic aneurysm) Father   .  Valvular heart disease Half-Brother 34       Bicuspid aortic  valve  . Lung cancer Maternal Aunt   . Heart disease Paternal Grandfather 57       d. 42  . Other Half-Sister        lump in her breastw  . Melanoma Nephew        pat 1/2 sisters' son dx 5x  . Colon cancer Neg Hx     Review of Systems: Constitutional: Doesn't report fevers, chills or abnormal weight loss Eyes: Doesn't report blurriness of vision Ears, nose, mouth, throat, and face: Doesn't report sore throat Respiratory: Doesn't report cough, dyspnea or wheezes Cardiovascular: Doesn't report palpitation, chest discomfort  Gastrointestinal:  Doesn't report nausea, constipation, diarrhea GU: Doesn't report incontinence Skin: Doesn't report skin rashes Neurological: Per HPI Musculoskeletal: Doesn't report joint pain Behavioral/Psych: Doesn't report anxiety  Physical Exam: Vitals:   09/18/20 1434  BP: (!) 149/89  Pulse: 85  Resp: 18  Temp: 98.2 F (36.8 C)  SpO2: 99%   KPS: 90. General: Alert, cooperative, pleasant, in no acute distress Head: Normal EENT: No conjunctival injection or scleral icterus.  Lungs: Resp effort normal Cardiac: Regular rate Abdomen: Non-distended abdomen Skin: No rashes cyanosis or petechiae. Extremities: No clubbing or edema  Neurologic Exam: Mental Status: Awake, alert, attentive to examiner. Oriented to self and environment. Language is fluent with intact comprehension.  Cranial Nerves: Visual acuity is grossly normal. Visual fields are full. Extra-ocular movements intact. No ptosis. Face is symmetric Motor: Tone and bulk are normal. Power is full in both arms and legs. Reflexes are symmetric, no pathologic reflexes present.  Sensory: Intact to light touch Gait: Normal.   Labs: I have reviewed the data as listed    Component Value Date/Time   NA 140 07/31/2020 0949   K 3.7 07/31/2020 0949   CL 103 07/31/2020 0949   CO2 26 06/23/2020 0813   GLUCOSE 97 07/31/2020 0949   BUN  15 07/31/2020 0949   CREATININE 1.00 07/31/2020 0949   CREATININE 1.03 06/23/2020 0813   CALCIUM 9.6 06/23/2020 0813   PROT 7.5 06/23/2020 0813   ALBUMIN 4.2 06/23/2020 0813   AST 21 06/23/2020 0813   ALT 20 06/23/2020 0813   ALKPHOS 74 06/23/2020 0813   BILITOT 0.6 06/23/2020 0813   GFRNONAA >60 06/23/2020 0813   GFRAA >60 06/23/2020 0813   Lab Results  Component Value Date   WBC 4.2 06/23/2020   NEUTROABS 2.4 06/23/2020   HGB 15.0 07/31/2020   HCT 44.0 07/31/2020   MCV 88.1 06/23/2020   PLT 246 06/23/2020    Assessment/Plan Arachnoid cyst [G93.0]  Jacob Hayes presents today with near resolution of occipital neuralgia symptoms.  Non-specific vertigo is improved through rehabilitation services.  Do not recommend any headache prevention medications due to success of nerve block.  We encouraged him to continue to work hard on his PT/rehab.  We appreciate the opportunity to participate in the care of Jacob Hayes.  He may return to clinic as needed for further neurologic care.  All questions were answered. The patient knows to call the clinic with any problems, questions or concerns. No barriers to learning were detected.  The total time spent in the encounter was 30 minutes and more than 50% was on counseling and review of test results   Ventura Sellers, MD Medical Director of Neuro-Oncology [facility] [place] 09/18/20 2:45 PM

## 2020-09-21 ENCOUNTER — Encounter: Payer: Self-pay | Admitting: Hematology

## 2020-09-21 ENCOUNTER — Telehealth: Payer: Self-pay | Admitting: Internal Medicine

## 2020-09-21 NOTE — Telephone Encounter (Signed)
No 10/29 los 

## 2020-09-23 ENCOUNTER — Other Ambulatory Visit: Payer: Self-pay

## 2020-09-23 ENCOUNTER — Encounter: Payer: Self-pay | Admitting: Physical Therapy

## 2020-09-23 ENCOUNTER — Ambulatory Visit: Payer: Commercial Managed Care - PPO | Attending: Internal Medicine | Admitting: Physical Therapy

## 2020-09-23 DIAGNOSIS — R42 Dizziness and giddiness: Secondary | ICD-10-CM | POA: Insufficient documentation

## 2020-09-23 NOTE — Therapy (Signed)
[name] [address] [place], [place], [address] Phone: [phone]   Fax:  [phone]  Physical Therapy Treatment  Patient Details  Name: Jacob Hayes MRN: [id number] Date of Birth: [birth year] Referring Provider (PT): Dr. Cecil Cobbs   Encounter Date: 09/23/2020   PT End of Session - 09/23/20 1705    Visit Number 6    Number of Visits 9    Date for PT Re-Evaluation 10/08/20    PT Start Time 1350    PT Stop Time 1440    PT Time Calculation (min) 50 min    Equipment Utilized During Treatment Gait belt    Activity Tolerance Patient tolerated treatment well    Behavior During Therapy [facility] for tasks assessed/performed           Past Medical History:  Diagnosis Date  . Allergic rhinitis   . Arachnoid cyst    chronic and stable per oncology note pt was had work-up at [facility]  . Bruxism   . 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 Essential hypertension, benign    followed by pcp  (pt had ETT done 08-03-2012 in epic, no ischemia)  . Family history of breast cancer   . Family history of melanoma   . GERD   . Hyperlipidemia    Since age 64  . Hypothyroidism, postradioiodine therapy    followed by pcp---  s/p  RAI 2014  . Meniere's disease   . Migraine   . PONV (postoperative nausea and vomiting)     Past Surgical History:  Procedure Laterality Date  . BIOPSY N/A 03/20/2013   Procedure: BIOPSY;  Surgeon: [name], MD;  [facility];  Service: Endoscopy;  Laterality: N/A;  Possible small bowel biopsy  . BIOPSY  07/05/2018   Procedure: BIOPSY;  Surgeon: [name], MD;  [facility];  Service: Endoscopy;;  rectal mass  . COLONOSCOPY WITH PROPOFOL N/A 07/05/2018   Procedure: COLONOSCOPY WITH PROPOFOL;  Surgeon: [name], MD;  [facility];  Service: Endoscopy;  Laterality: N/A;  8:30am  . COLONOSCOPY WITH PROPOFOL N/A 07/25/2019   Procedure: COLONOSCOPY WITH PROPOFOL;  Surgeon: [name], MD;  [facility];  Service: Endoscopy;  Laterality: N/A;  2:15pm  . ESOPHAGOGASTRODUODENOSCOPY N/A 03/20/2013   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  One slightly nodular.  Small hiatal hernia.  Multiple fundic gland appearing polyps. no evidence of celiac disease, malignancy, h.pylori, barrett's.   Marland Kitchen POLYPECTOMY  07/05/2018   Procedure: POLYPECTOMY;  Surgeon: [name], MD;  [facility];  Service: Endoscopy;;  hepatic flexurex2;  . PORT-A-CATH REMOVAL N/A 07/31/2020   Procedure: REMOVAL PORT-A-CATH;  Surgeon: [name], MD;  Location: [facility];  Service: General;  Laterality: N/A;  . PORTACATH PLACEMENT N/A 08/23/2018   Procedure: INSERTION PORT-A-CATH;  Surgeon: [name], MD;  Location: [facility];  Service: General;  Laterality: N/A;  LMA  . XI ROBOTIC ASSISTED LOWER ANTERIOR RESECTION  08-07-2018   [name]  @[facility]     There were no vitals filed for this visit.   Subjective Assessment - 09/23/20 1356    Subjective Patient states that on Saturday he woke up with  a crick in his neck. Patient reports he could not do head turns because his neck bothered him too much on Monday. Patient reports the ball follows recreate his symptoms and he was able to do those instead.    Pertinent History Patient has had longstanding intermittent dizziness issues for 10 years or more, but it has worsened since chemotherapy.  Patient reports that he constantly feels like he is "on a boat all the time". Patient has a history of motion sickness since childhood, but this worsened as well.  Patient reports that he will get motion sickness with nausea now when he drives a car as well as when he is in a  passenger.  Patient reports that he restricts his travel because of the increase in his dizziness and motion sickness symptoms.  Patient reports that prior to Covid he frequently traveled for his work.  Patient has a 10-year history or more of migraines which increased when he started chemotherapy in 2019.  In addition per medical record, patient developed severe neuropathic symptoms primarily in his hands which have mostly resolved.  Patient reports that he also had bilateral feet numbness after chemotherapy and then pins-and-needles which have also resolved. Patient reports that he had a work-up performed at [facility] neurology including an EEG.  Patient states he was started on Topamax as he reports that they thought that he was having vestibular migraines.  Patient recently has been seen by neurology and reports that he was diagnosed with occipital neuralgia and was referred to [place] [facility] for a greater occipital nerve block for headaches. Patient reports that he has a stiff neck and neck pain every few days now. Patient reports that he has an appointment tomorrow to receive the occipital nerve block.  Patient reports that activities that increase his dizziness include motion, riding in cars, watching video games, sudden head movements and body turns.  Patient reports nothing eases his dizziness.  Patient reports medications such as Valium, meclizine help to manage his symptoms, but it does not prevent them from occurring or from escalating his symptoms.  Patient reports that he has had no prior physical therapy for neck or vestibular issues and patient reports he has not been seen by an ear nose and throat physician in regards to his vestibular issues.    Diagnostic tests CT head on 06/23/2020: No evidence of acute intracranial abnormality or intracranial metastatic disease.  Posterior fossa arachnoid cyst unchanged as compared to MRI on 08/29/2013    Patient Stated Goals To have decreased  dizziness and decreased motion sensitivity           Neuromuscular Re-education:  1/2 Foam Roll:  On 1/2 foam roll with flat side down and then round side down worked on [employer].  Worked on 1/2 foam roll flat side down static holds with  ball circles and then ball toss against mirror. Patient requiring assistance ranging from CGA to min A.  Patient improved with practice and demonstrate ankle and hip strategies.   Card Sorting Activity:  Patient performed standing on Airex pad, performing deck of card sorting activity on mat table sorting by numbers and then turning to alternate sides to place cards to incorporate horizontal and vertical head turning and body turns.  Patient reports this recreates mild floating dizziness sensation. Patient reports mild dizziness with this activity.  Discussed that patient could practice this at home standing on a pillow and sorting cards.    Will plan on retesting functional outcome  measures next visit and updating goals.      PT Short Term Goals - 09/17/20 1701      PT SHORT TERM GOAL #1   Title Patient will be independent with home exercise program in order to decrease dizziness symptoms and improve balance in order to improve function at home and work.    Time 4    Period Weeks    Status Achieved    Target Date 09/10/20             PT Long Term Goals - 08/13/20 1557      PT LONG TERM GOAL #1   Title Patient will have a decrease in dizziness handicap inventory score by 10 points or more to indicate decreased dizziness symptoms.    Baseline Scored 38/100 on 08/12/2020;    Time 8    Period Weeks    Status New    Target Date 10/08/20      PT LONG TERM GOAL #2   Title Patient will report 50% or greater improvement in his symptoms of dizziness with provoking motions or positions.    Time 8    Period Weeks    Status New    Target Date 10/08/20      PT LONG TERM GOAL #3   Title Patient will score 75% or greater on the  abbreviated ABC-6 scale in order to demonstrate improvement in patient's balance for safety with community activities.    Baseline Scored 65% on abbreviated ABC-6 scale on 08/12/2020;    Time 8    Period Weeks    Status New    Target Date 10/08/20               Plan - 09/28/20 1349    Clinical Impression Statement Patient motivated to session. Patient's balance challenged by activities on half foam roll, but this did not increase patient's dizziness symptoms. Patient using primarily ankle and hip strategies and improved with practice. Patient reported reproduction of dizziness symptoms with card sorting activity while standing on Airex pad. Discussed how patient could stand on pillow and perform card sorting activity at home. Will plan on retesting functional outcome measures next session and assessing progress towards goals.    Personal Factors and Comorbidities Comorbidity 3+;Past/Current Experience;Time since onset of injury/illness/exacerbation    Comorbidities Colon cancer with chemotherapy and radiation, hypertension, hypothyroidism, migraines, Mnire's, anxiety/depression    Examination-Participation Restrictions Other;Driving   Travel, riding in cars   Stability/Clinical Decision Making Unstable/Unpredictable    Rehab Potential Fair    PT Frequency 1x / week    PT Duration 8 weeks    PT Treatment/Interventions Canalith Repostioning;Balance training;Neuromuscular re-education;Patient/family education;Vestibular;Dry needling;Spinal Manipulations;Manual techniques;Therapeutic exercise;Therapeutic activities;Gait training    PT Next Visit Plan Review VOR x1 and progress as tolerated, perform activities in standing with head and body turns on uneven surfaces    PT Home Exercise Plan VOR x1 in sitting 30 seconds reps                Patient will benefit from skilled therapeutic intervention in order to improve the following deficits and impairments:     Visit  Diagnosis: Dizziness and giddiness     Problem List Patient Active Problem List   Diagnosis Date Noted  . Arachnoid cyst 07/10/2020  . Genetic testing 11/23/2018  . Port-A-Cath in place 11/16/2018  . Family history of breast cancer   . Family history of melanoma   . Cancer of sigmoid colon ([place]) 08/07/2018  .  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 Rectal bleeding 06/14/2018  . Change in bowel function 06/14/2018  . Abnormal TSH 03/07/2013  . Abdominal pain, epigastric 03/06/2013  . Nausea alone 03/06/2013  . Bloating 03/06/2013  . Dizziness 08/02/2012  . Heart murmur 08/02/2012  . Migraine headache without aura 10/19/2011  . HYPERLIPIDEMIA 07/07/2010  . Essential hypertension, benign 07/07/2010  . GERD 07/07/2010   Lady Deutscher PT, DPT [phone] Lady Deutscher 09/23/2020, 5:06 PM  [facility] [address] [place], [place], [address] Phone: [phone]   Fax:  [phone]  Name: Savien Mamula MRN: [id number] Date of Birth: 1981-05-03

## 2020-09-30 ENCOUNTER — Ambulatory Visit: Payer: Commercial Managed Care - PPO | Admitting: Physical Therapy

## 2020-09-30 ENCOUNTER — Encounter: Payer: Self-pay | Admitting: Physical Therapy

## 2020-09-30 ENCOUNTER — Other Ambulatory Visit: Payer: Self-pay

## 2020-09-30 DIAGNOSIS — R42 Dizziness and giddiness: Secondary | ICD-10-CM | POA: Diagnosis not present

## 2020-10-01 NOTE — Therapy (Signed)
[facility] [address] [place], [place], [address] Phone: [phone]   Fax:  [phone]  Physical Therapy Treatment/discharge Summary Dates of service: 08/12/2020 to 09/30/2020 Total number of visits: 7  Patient Details  Name: Mcihael Hinderman MRN: [id number] Date of Birth: Jul 09, 1981 Referring Provider (PT): Dr. Cecil Cobbs   Encounter Date: 09/30/2020   PT End of Session - 09/30/20 1347    Visit Number 7    Number of Visits 9    Date for PT Re-Evaluation 10/08/20    PT Start Time 2620    PT Stop Time 1425    PT Time Calculation (min) 40 min    Equipment Utilized During Treatment Gait belt    Activity Tolerance Patient tolerated treatment well    Behavior During Therapy [facility] for tasks assessed/performed           Past Medical History:  Diagnosis Date  . Allergic rhinitis   . Arachnoid cyst    chronic and stable per oncology note pt was had work-up at [facility]  . Bruxism   . 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 Essential hypertension, benign    followed by pcp  (pt had ETT done 08-03-2012 in epic, no ischemia)  . Family history of breast cancer   . Family history of melanoma   . GERD   . Hyperlipidemia    Since age 39  . Hypothyroidism, postradioiodine therapy    followed by pcp---  s/p  RAI 2014  . Meniere's disease   . Migraine   . PONV (postoperative nausea and vomiting)     Past Surgical History:  Procedure Laterality Date  . BIOPSY N/A 03/20/2013   Procedure: BIOPSY;  Surgeon: [name], MD;  [facility];  Service: Endoscopy;  Laterality: N/A;  Possible small bowel biopsy  . BIOPSY  07/05/2018   Procedure: BIOPSY;  Surgeon: [name], MD;  [facility];  Service: Endoscopy;;  rectal mass  . COLONOSCOPY WITH PROPOFOL N/A 07/05/2018    Procedure: COLONOSCOPY WITH PROPOFOL;  Surgeon: [name], MD;  [facility];  Service: Endoscopy;  Laterality: N/A;  8:30am  . COLONOSCOPY WITH PROPOFOL N/A 07/25/2019   Procedure: COLONOSCOPY WITH PROPOFOL;  Surgeon: [name], MD;  [facility];  Service: Endoscopy;  Laterality: N/A;  2:15pm  . ESOPHAGOGASTRODUODENOSCOPY N/A 03/20/2013   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  One slightly nodular.  Small hiatal hernia.  Multiple fundic gland appearing polyps. no evidence of celiac disease, malignancy, h.pylori, barrett's.   Marland Kitchen POLYPECTOMY  07/05/2018   Procedure: POLYPECTOMY;  Surgeon: [name], MD;  [facility];  Service: Endoscopy;;  hepatic flexurex2;  . PORT-A-CATH REMOVAL N/A 07/31/2020   Procedure: REMOVAL PORT-A-CATH;  Surgeon: [name], MD;  Location: [facility];  Service: General;  Laterality: N/A;  . PORTACATH PLACEMENT N/A 08/23/2018   Procedure: INSERTION PORT-A-CATH;  Surgeon: [name], MD;  Location: [facility];  Service: General;  Laterality: N/A;  LMA  . XI ROBOTIC ASSISTED LOWER ANTERIOR RESECTION  08-07-2018   [name]  _0     There were no vitals filed for this visit.   Subjective Assessment - 09/30/20 1346  Subjective Patient reports he has been more dizzy today and states he is tired.  Patient reports that he has not been having headaches.    Pertinent History Patient has had longstanding intermittent dizziness issues for 10 years or more, but it has worsened since chemotherapy.  Patient reports that he constantly feels like he is "on a boat all the time". Patient has a history of motion sickness since childhood, but this worsened as well.  Patient reports that he will get motion sickness with nausea now when he drives a car as well as when he is in a passenger.  Patient reports that he  restricts his travel because of the increase in his dizziness and motion sickness symptoms.  Patient reports that prior to Covid he frequently traveled for his work.  Patient has a 10-year history or more of migraines which increased when he started chemotherapy in 2019.  In addition per medical record, patient developed severe neuropathic symptoms primarily in his hands which have mostly resolved.  Patient reports that he also had bilateral feet numbness after chemotherapy and then pins-and-needles which have also resolved. Patient reports that he had a work-up performed at [facility] neurology including an EEG.  Patient states he was started on Topamax as he reports that they thought that he was having vestibular migraines.  Patient recently has been seen by neurology and reports that he was diagnosed with occipital neuralgia and was referred to [place] [facility] for a greater occipital nerve block for headaches. Patient reports that he has a stiff neck and neck pain every few days now. Patient reports that he has an appointment tomorrow to receive the occipital nerve block.  Patient reports that activities that increase his dizziness include motion, riding in cars, watching video games, sudden head movements and body turns.  Patient reports nothing eases his dizziness.  Patient reports medications such as Valium, meclizine help to manage his symptoms, but it does not prevent them from occurring or from escalating his symptoms.  Patient reports that he has had no prior physical therapy for neck or vestibular issues and patient reports he has not been seen by an ear nose and throat physician in regards to his vestibular issues.    Diagnostic tests CT head on 06/23/2020: No evidence of acute intracranial abnormality or intracranial metastatic disease.  Posterior fossa arachnoid cyst unchanged as compared to MRI on 08/29/2013    Patient Stated Goals To have decreased dizziness and decreased motion  sensitivity           Neuromuscular Re-education:  FUNCTIONAL OUTCOME MEASURES:  Results Comments  DHI  30/100  low perception of handicap  simplified ABC Scale  83.3%  safe for community mobility  DGI  23/24  safe for community mobility  FOTO  92/100  good function with ADLs and mobility    Clinical Test of Sensory Interaction for Balance (CTSIB): CONDITION TIME STRATEGY SWAY  Eyes open, firm surface 30 seconds ankle   Eyes closed, firm surface 30 seconds ankle +1  Eyes open, foam surface 30 seconds ankle +1  Eyes closed, foam surface 30 seconds Ankle, hip +2   Discussed home exercise program.  Repeated functional outcome testing and compared to prior testing.  Patient improved from 65% to 83% on the simplified ABC scale indicating decreased fall risk.  Patient scored 23/24 on the DGI indicating safe for community mobility.  Patient scored 30/100 on the dizziness handicap inventory indicating low perception of handicap.  Patient scored 92/100  on FOTO indicating good function with ADLs and mobility.  Patient has met short-term goal of independence with home exercise program.  Patient partially met 2/3 and met 1/3 long-term goals as set on plan of care.  Patient reports 35 to 40% improvement in his symptoms of dizziness overall.  Patient in agreement with discharge from PT services at this time.  Patient plans to continue to perform home exercise program.  Will discharge patient from PT services at this time.    PT Education - 09/30/20 1346    Education Details Discussed functional outcome testing and progress towards goals, discussed plan of care and discharge plans    Person(s) Educated Patient    Methods Explanation    Comprehension Verbalized understanding            PT Short Term Goals - 09/17/20 1701      PT SHORT TERM GOAL #1   Title Patient will be independent with home exercise program in order to decrease dizziness symptoms and improve balance in order to improve  function at home and work.    Time 4    Period Weeks    Status Achieved    Target Date 09/10/20             PT Long Term Goals - 09/30/20 1417      PT LONG TERM GOAL #1   Title Patient will have a decrease in dizziness handicap inventory score by 10 points or more to indicate decreased dizziness symptoms.    Baseline Scored 38/100 on 08/12/2020; scored 30/100    Time 8    Period Weeks    Status Partially Met      PT LONG TERM GOAL #2   Title Patient will report 50% or greater improvement in his symptoms of dizziness with provoking motions or positions.    Baseline reports 35-40% improvement    Time 8    Period Weeks    Status Partially Met      PT LONG TERM GOAL #3   Title Patient will score 75% or greater on the abbreviated ABC-6 scale in order to demonstrate improvement in patient's balance for safety with community activities.    Baseline Scored 65% on abbreviated ABC-6 scale on 08/12/2020; 83.3%    Time 8    Period Weeks    Status Achieved             Plan - 10/01/20 1032    Clinical Impression Statement Repeated functional outcome testing and compared to prior testing.  Patient improved from 65% to 83% on the simplified ABC scale indicating decreased fall risk.  Patient scored 23/24 on the DGI indicating safe for community mobility.  Patient scored 30/100 on the dizziness handicap inventory indicating low perception of handicap.  Patient scored 92/100 on FOTO indicating good function with ADLs and mobility.  Patient has met short-term goal of independence with home exercise program.  Patient partially met 2/3 and met 1/3 long-term goals as set on plan of care.  Patient reports 35 to 40% improvement in his symptoms of dizziness overall.  Patient in agreement with discharge from PT services at this time.  Patient plans to continue to perform home exercise program.  Will discharge patient from PT services at this time.    Personal Factors and Comorbidities Comorbidity  3+;Past/Current Experience;Time since onset of injury/illness/exacerbation    Comorbidities Colon cancer with chemotherapy and radiation, hypertension, hypothyroidism, migraines, Mnire's, anxiety/depression    Examination-Participation Restrictions Other;Driving   Travel,  riding in cars   Stability/Clinical Decision Making Unstable/Unpredictable    Rehab Potential Fair    PT Frequency 1x / week    PT Duration 8 weeks    PT Treatment/Interventions Canalith Repostioning;Balance training;Neuromuscular re-education;Patient/family education;Vestibular;Dry needling;Spinal Manipulations;Manual techniques;Therapeutic exercise;Therapeutic activities;Gait training    PT Next Visit Plan Review VOR x1 and progress as tolerated, perform activities in standing with head and body turns on uneven surfaces    PT Home Exercise Plan VOR x1 in sitting 30 seconds reps           Patient will benefit from skilled therapeutic intervention in order to improve the following deficits and impairments:  Decreased balance, Dizziness, Pain  Visit Diagnosis: Dizziness and giddiness     Problem List Patient Active Problem List   Diagnosis Date Noted  . Arachnoid cyst 07/10/2020  . Genetic testing 11/23/2018  . Port-A-Cath in place 11/16/2018  . Family history of breast cancer   . Family history of melanoma   . Cancer of sigmoid colon ([place]) 08/07/2018  . 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 Rectal bleeding 06/14/2018  . Change in bowel function 06/14/2018  . Abnormal TSH 03/07/2013  . Abdominal pain, epigastric 03/06/2013  . Nausea alone 03/06/2013  . Bloating 03/06/2013  . Dizziness 08/02/2012  . Heart murmur 08/02/2012  . Migraine headache without aura 10/19/2011  . HYPERLIPIDEMIA 07/07/2010  . Essential hypertension, benign 07/07/2010  . GERD 07/07/2010   Lady Deutscher PT, DPT [phone] Lady Deutscher 10/01/2020, 10:32 AM  [facility] [address] [place], [place], [address] Phone: [phone]   Fax:  [phone]  Name: Avon Mergenthaler MRN: [id number] Date of Birth: [birth year]

## 2020-10-26 ENCOUNTER — Encounter: Payer: Self-pay | Admitting: Internal Medicine

## 2020-11-04 NOTE — Progress Notes (Signed)
[place]   Telephone:([phone]) [phone] Fax:(336) [phone]   Clinic Follow up Note   Patient Care Team: Chesley Noon, MD as PCP - General (Family Medicine) [name], MD (Cardiology) [name], MD as Attending Physician (Gastroenterology)  Date of Service:  11/06/2020  CHIEF COMPLAINT:  f/u colon cancer  SUMMARY OF ONCOLOGIC HISTORY: Oncology History Overview Note  Cancer Staging Cancer of sigmoid colon [facility]) Staging form: Colon and Rectum, AJCC 8th Edition - Pathologic stage from 08/07/2018: Stage IIIB (pT3, pN1a, cM0) - Signed by Truitt Merle, MD on 10/25/2018     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 07/05/18  IMPRESSION - Tumor in the rectum. Biopsied. - Diverticulosis in the entire examined colon. - Two 5 to 6 mm polyps, removed with a cold snare. Resected and retrieved. - The examination was otherwise normal on direct and retroflexion views.    07/05/2018 Initial Biopsy   Diagnosis 07/05/18 1. Colon, polyp(s), hepatic flexure - TUBULAR ADENOMA (ONE). - NO HIGH GRADE DYSPLASIA OR MALIGNANCY. 2. Rectum, biopsy, mass - INVASIVE MODERATELY DIFFERENTIATED COLORECTAL ADENOCARCINOMA.   07/10/2018 Imaging   CT CAP W Constrast 07/10/18  IMPRESSION: 1. Mass within the rectosigmoid colon is identified compatible with findings from recent colonoscopy. 2. No evidence for nodal metastasis or distant metastatic disease. 3. Left kidney cyst.   07/17/2018 Imaging   MRI PElvis 07/17/18  IMPRESSION: 4.2 cm polypoid lesion along the right lateral aspect of the upper rectum, corresponding to the patient's biopsy-proven rectal adenocarcinoma, as above. Rectal adenocarcinoma T stage:  T1 or T2 Rectal adenocarcinoma N stage:  N0 Distance from tumor to the anal sphincter is  14 cm.   08/07/2018 Initial Diagnosis   Rectosigmoid cancer ([place])   08/07/2018 Surgery   XI ROBOTIC LOW ANTERIOR RESECTION by Dr. Kieth Brightly and Dr. Marcello Moores    08/07/2018 Pathologic Stage   Diagnosis 08/07/18 1. Colon, segmental resection for tumor, rectosigmoid - INVASIVE MODERATELY DIFFERENTIATED COLORECTAL ADENOCARCINOMA, 3.5 CM. - CARCINOMA FOCALLY INVOLVES PERICOLONIC CONNECTIVE TISSUE. - METASTATIC CARCINOMA IN ONE OF FIFTEEN LYMPH NODES (1/15). - MARGINS NOT INVOLVED. 2. Colon, resection margin (donut), distal ring, sigmoid - BENIGN COLON. - NO EVIDENCE OF MALIGNANCY.   08/07/2018 Cancer Staging   Staging form: Colon and Rectum, AJCC 8th Edition - Pathologic stage from 08/07/2018: Stage IIIB (pT3, pN1a, cM0) - Signed by Truitt Merle, MD on 10/25/2018   08/29/2018 -  Chemotherapy   Adjuvant CAPOX every 3 weeks with Xeloda 2066m twice daily 2 weeks on 1 week off starting 08/29/18. D/c Oxaliplatin after 09/28/18 (cycle 2) due to very poor toleration. Increased to 20044min AM and 250068mn PM starting with cycle 5.  Last 8th cycle dose was taken 02/26/19.    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  The Multi-Gene Panel offered by [facility] includes sequencing and/or deletion duplication testing of the following 84 genes: AIP, ALK, APC, ATM, AXIN2,BAP1,  BARD1, BLM, BMPR1A, BRCA1, BRCA2, BRIP1, CASR, CDC73, CDH1, CDK4, CDKN1B, CDKN1C, CDKN2A (p14ARF), CDKN2A (p16INK4a), CEBPA, CHEK2, CTNNA1, DICER1, DIS3L2, EGFR (c.2369C>T, p.Thr790Met variant only), EPCAM (Deletion/duplication testing only), FH, FLCN, GATA2, GPC3, GREM1 (Promoter region deletion/duplication testing only), HOXB13 (c.251G>A, p.Gly84Glu), HRAS, KIT, MAX, MEN1, MET, MITF (c.952G>A, p.Glu318Lys variant only), MLH1, MSH2, MSH3, MSH6, MUTYH, NBN, NF1, NF2, NTHL1, PALB2, PDGFRA,  PHOX2B, PMS2, POLD1, POLE, POT1, PRKAR1A, PTCH1, PTEN, RAD50, RAD51C, RAD51D, RB1, RECQL4, RET, RUNX1, SDHAF2,  SDHA (sequence changes only), SDHB, SDHC, SDHD, SMAD4, SMARCA4, SMARCB1, SMARCE1, STK11, SUFU, TERC, TERT, TMEM127, TP53, TSC1, TSC2, VHL, WRN and WT1.  The report date is 11/22/2018.   03/20/2019 Imaging   CT CAP 03/20/19  IMPRESSION: 1.  Interval postoperative findings of rectal resection.  2. No definite evidence of metastatic disease in the chest, abdomen, or pelvis.  3. No change in small prominent retroperitoneal lymph nodes. Attention on follow-up.  4. There is a subtle subcapsular hyperdensity of the dome of the liver, likely focal fatty sparing (series 2, image 49). Attention on follow-up.   07/25/2019 Procedure   Colonoscopy by [name] 07/25/19  IMPRESSION - Diverticulosis. Surgical anastomosis at 10 cm. Evidence of neoplasm. - The examination was otherwise normal on direct and retroflexion views. - No specimens collected.   09/20/2019 Imaging   CT AP W Contrast 09/20/19  IMPRESSION: Redemonstrated postoperative findings of rectosigmoid resection and reanastomosis. No evidence of recurrent or metastatic disease in the abdomen or pelvis.   06/23/2020 Imaging   CT CAP W contrast  IMPRESSION: 1. Redemonstrated postoperative findings of rectosigmoid resection and reanastomosis. 2. No evidence of recurrent or metastatic disease in the chest, abdomen, or pelvis.   06/23/2020 Imaging   CT head  IMPRESSION: No evidence of acute intracranial abnormality or intracranial metastatic disease.   1.7 x 2.7 x 3.3 cm posterior fossa arachnoid cyst, unchanged in size as compared to the MRI of 08/29/2013.   Mild ethmoid sinus mucosal thickening.        CURRENT THERAPY:  Surveillance   INTERVAL HISTORY:  Jacob Hayes is here for a follow up of colon cancer. He presents to the clinic alone. He notes he is doing well and busy with work. He notes he had his PAC removed by [name]. He did see Dr Mickeal Skinner for headaches. He has vestibular rehabilitation for his dizziness.  He notes this has helped him al lot. He notes he plans to fly in January to test this. Dr Mickeal Skinner notes he has occipital neuralgia. He had injections in the past 2 months at [facility]. The injections did help him but pain is starting to come back. He notes neuropathy is mostly from long term cold exposure. He denies abdominal pain. He still has inconsistent BM but manageable. He notes he has received his COVID booster.    REVIEW OF SYSTEMS:   Constitutional: Denies fevers, chills or abnormal weight loss Eyes: Denies blurriness of vision Ears, nose, mouth, throat, and face: Denies mucositis or sore throat Respiratory: Denies cough, dyspnea or wheezes Cardiovascular: Denies palpitation, chest discomfort or lower extremity swelling Gastrointestinal:  Denies nausea, heartburn or change in bowel habits Skin: Denies abnormal skin rashes Lymphatics: Denies new lymphadenopathy or easy bruising Neurological: (+) Minimal neuropathy with cold sensitivity  Behavioral/Psych: Mood is stable, no new changes  All other systems were reviewed with the patient and are negative.   MEDICAL HISTORY:  Past Medical History:  Diagnosis Date  . Allergic rhinitis   . Arachnoid cyst    chronic and stable per oncology note pt was had [facility]-up [facility]  . Bruxism   . Colon cancer [facility]) oncologist--- dr Burr Medico   dx 09/ 2019 rectosigmoid cancer, Stage IIIB;  s/p  lower anterior colon resection 08-07-2018 and completed chemo 04/ 2020  . Essential hypertension, benign    followed by pcp  (pt had ETT done  08-03-2012 in epic, no ischemia)  . Family history of breast cancer   . Family history of melanoma   . GERD   . Hyperlipidemia    Since age 58  . Hypothyroidism, postradioiodine therapy    followed by pcp---  s/p  RAI 2014  . Meniere's disease   . Migraine   . PONV (postoperative nausea and vomiting)     SURGICAL HISTORY: Past Surgical History:  Procedure Laterality Date  . BIOPSY N/A 03/20/2013   Procedure:  BIOPSY;  Surgeon: [name], MD;  [facility];  Service: Endoscopy;  Laterality: N/A;  Possible small bowel biopsy  . BIOPSY  07/05/2018   Procedure: BIOPSY;  Surgeon: [name], MD;  [facility];  Service: Endoscopy;;  rectal mass  . COLONOSCOPY WITH PROPOFOL N/A 07/05/2018   Procedure: COLONOSCOPY WITH PROPOFOL;  Surgeon: [name], MD;  [facility];  Service: Endoscopy;  Laterality: N/A;  8:30am  . COLONOSCOPY WITH PROPOFOL N/A 07/25/2019   Procedure: COLONOSCOPY WITH PROPOFOL;  Surgeon: [name], MD;  [facility];  Service: Endoscopy;  Laterality: N/A;  2:15pm  . ESOPHAGOGASTRODUODENOSCOPY N/A 03/20/2013   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  One slightly nodular.  Small hiatal hernia.  Multiple fundic gland appearing polyps. no evidence of celiac disease, malignancy, h.pylori, barrett's.   Marland Kitchen POLYPECTOMY  07/05/2018   Procedure: POLYPECTOMY;  Surgeon: [name], MD;  [facility];  Service: Endoscopy;;  hepatic flexurex2;  . PORT-A-CATH REMOVAL N/A 07/31/2020   Procedure: REMOVAL PORT-A-CATH;  Surgeon: [name], MD;  Location: [facility];  Service: General;  Laterality: N/A;  . PORTACATH PLACEMENT N/A 08/23/2018   Procedure: INSERTION PORT-A-CATH;  Surgeon: [name], MD;  Location: [facility];  Service: General;  Laterality: N/A;  LMA  . XI ROBOTIC ASSISTED LOWER ANTERIOR RESECTION  08-07-2018   [name]  '@[facility]'     I have reviewed the social history and family history with the patient and they are unchanged from previous note.  ALLERGIES:  has No Known Allergies.  MEDICATIONS:  Current Outpatient Medications  Medication Sig Dispense Refill  . acetaminophen (TYLENOL) 500 MG tablet Take 1,000 mg by mouth every 6 (six) hours as needed for moderate pain or headache.     .  Ascorbic Acid (VITAMIN C) 1000 MG tablet Take 1,000 mg by mouth daily.    Marland Kitchen b complex vitamins capsule Take 1 capsule by mouth daily.    . cetirizine (ZYRTEC) 10 MG tablet Take 10 mg by mouth daily as needed for allergies.     . diazepam (VALIUM) 5 MG tablet Take 2.5 mg by mouth at bedtime.   1  . diphenhydrAMINE (BENADRYL) 25 MG tablet Take 25-50 mg by mouth 2 (two) times daily as needed for allergies.    . diphenhydramine-acetaminophen (TYLENOL PM) 25-500 MG TABS tablet Take 1 tablet by mouth at bedtime as needed (sleep).    . fluticasone (FLONASE) 50 MCG/ACT nasal spray Place 2 sprays into the nose daily. (Patient taking differently: Place 2 sprays into the nose daily as needed (for seasonal allergies.). ) 16 g 11  . LORazepam (ATIVAN) 1 MG tablet Take 1 mg by mouth every 8 (eight) hours as needed for anxiety.  (Patient not taking: Reported on 08/12/2020)    . losartan-hydrochlorothiazide (HYZAAR) 100-12.5 MG tablet Take 0.5 tablets by  mouth 2 (two) times daily.     . Magnesium 400 MG TABS Take 1 tablet by mouth in the morning and at bedtime. Per pt takes for dizziness    . meclizine (ANTIVERT) 25 MG tablet Take 25 mg by mouth 3 (three) times daily as needed for dizziness.    . methimazole (TAPAZOLE) 5 MG tablet Take 5 mg by mouth 2 (two) times daily.     . Multiple Vitamin (MULTIVITAMIN WITH MINERALS) TABS tablet Take 1 tablet by mouth daily. Men's One-A-Day    . pantoprazole (PROTONIX) 40 MG tablet TAKE ONE TABLET BY MOUTH TWICE DAILY BEFORE A MEAL (Patient taking differently: Take 40 mg by mouth daily. ) 60 tablet 0  . simvastatin (ZOCOR) 40 MG tablet Take 40 mg by mouth every evening.      No current facility-administered medications for this visit.   Facility-Administered Medications Ordered in Other Visits  Medication Dose Route Frequency Provider Last Rate Last Admin  . influenza vac split quadrivalent PF (FLUARIX) injection 0.5 mL  0.5 mL Intramuscular Once Vaslow, Acey Lav, MD         PHYSICAL EXAMINATION: ECOG PERFORMANCE STATUS: 0 - Asymptomatic  Vitals:   11/06/20 1514  BP: (!) 132/94  Pulse: 82  Resp: 18  Temp: 98.7 F (37.1 C)  SpO2: 99%   Filed Weights   11/06/20 1514  Weight: 217 lb 4.8 oz (98.6 kg)    GENERAL:alert, no distress and comfortable SKIN: skin color, texture, turgor are normal, no rashes or significant lesions EYES: normal, Conjunctiva are pink and non-injected, sclera clear  NECK: supple, thyroid normal size, non-tender, without nodularity LYMPH:  no palpable lymphadenopathy in the cervical, axillary  LUNGS: clear to auscultation and percussion with normal breathing effort HEART: regular rate & rhythm and no murmurs and no lower extremity edema ABDOMEN:abdomen soft, non-tender and normal bowel sounds. No hepatomegaly  Musculoskeletal:no cyanosis of digits and no clubbing  NEURO: alert & oriented x 3 with fluent speech, no focal motor/sensory deficits  LABORATORY DATA:  I have reviewed the data as listed CBC Latest Ref Rng & Units 11/06/2020 07/31/2020 06/23/2020  WBC 4.0 - 10.5 K/uL 5.6 - 4.2  Hemoglobin 13.0 - 17.0 g/dL 14.7 15.0 15.4  Hematocrit 39.0 - 52.0 % 42.2 44.0 43.8  Platelets 150 - 400 K/uL 278 - 246     CMP Latest Ref Rng & Units 11/06/2020 07/31/2020 06/23/2020  Glucose 70 - 99 mg/dL 86 97 91  BUN 6 - 20 mg/dL '12 15 12  ' Creatinine 0.61 - 1.24 mg/dL 1.06 1.00 1.03  Sodium 135 - 145 mmol/L 139 140 137  Potassium 3.5 - 5.1 mmol/L 3.8 3.7 3.9  Chloride 98 - 111 mmol/L 104 103 104  CO2 22 - 32 mmol/L 27 - 26  Calcium 8.9 - 10.3 mg/dL 9.2 - 9.6  Total Protein 6.5 - 8.1 g/dL 7.5 - 7.5  Total Bilirubin 0.3 - 1.2 mg/dL 0.3 - 0.6  Alkaline Phos 38 - 126 U/L 63 - 74  AST 15 - 41 U/L 19 - 21  ALT 0 - 44 U/L 19 - 20      RADIOGRAPHIC STUDIES: I have personally reviewed the radiological images as listed and agreed with the findings in the report. No results found.   ASSESSMENT & PLAN:  Jacob Hayes is a 39 y.o.  male with    1. Rectosigmoid Cancer,Stage IIIB(pT3N1M0), MSS -He was diagnosed in 06/2018.He is s/p anterior resectionwith clear margins.Due to the tumor location  mainly in sigmoid colon, radiation was not recommended. -Due to the high risk of recurrence, he startedadjuvant chemotherapy withCAPOX. However he toleratedOxaliplatinpoorly and was stopped after 2 cycles. Hewas able to complete8 cyclesXeloda(including CAPOX).His surveillanceColonoscopyfrom 07/25/19 was benign. -He is clinically doing well. Labs reviewed, CBC and CMP WNL. Physical exam unremarkable. CEA is still pending. There is no clinical concern for recurrence.  -He is 2.5 years since cancer diagnosis. His risk of recurrence will decrease significantly after 3 years. Continue surveillance. Next colonoscopy in 2023. Next Scan in 06/2021, likely last scan.  -F/u in 4 months.   2. HTN -Controlled on Losartan and HCTZ. Continue to f/u with Dr. Domenic Polite.  3. Genetic TestingNegative  4. Arachnoid cyst, headaches, dizziness -He notes he has an arachnoid cyst in 2014 seen on CT head. His CT Head from 06/23/20 shows stable cyst  -He notes nausea and easily gets motion sickness even when still and his headaches continue to worsen. This is effecting his daily life.  -He is following Dr Mickeal Skinner and Neurologist at [facility] S/p vestibular rehabilitation and occipital neuralgia (he received injections at [facility] since 07/2020)     PLAN: -Lab and f/u in 4 months    No problem-specific Assessment & Plan notes found for this encounter.   No orders of the defined types were placed in this encounter.  All questions were answered. The patient knows to call the clinic with any problems, questions or concerns. No barriers to learning was detected. The total time spent in the appointment was 25 minutes.     Truitt Merle, MD 11/06/2020   I, Joslyn Devon, am acting as scribe for Truitt Merle, MD.   I have reviewed the above  documentation for accuracy and completeness, and I agree with the above.

## 2020-11-06 ENCOUNTER — Other Ambulatory Visit: Payer: Self-pay

## 2020-11-06 ENCOUNTER — Inpatient Hospital Stay (HOSPITAL_BASED_OUTPATIENT_CLINIC_OR_DEPARTMENT_OTHER): Payer: Commercial Managed Care - PPO | Admitting: Hematology

## 2020-11-06 ENCOUNTER — Inpatient Hospital Stay: Payer: Commercial Managed Care - PPO | Attending: Hematology

## 2020-11-06 VITALS — BP 132/94 | HR 82 | Temp 98.7°F | Resp 18 | Ht 75.0 in | Wt 217.3 lb

## 2020-11-06 DIAGNOSIS — R519 Headache, unspecified: Secondary | ICD-10-CM | POA: Insufficient documentation

## 2020-11-06 DIAGNOSIS — G930 Cerebral cysts: Secondary | ICD-10-CM | POA: Insufficient documentation

## 2020-11-06 DIAGNOSIS — C19 Malignant neoplasm of rectosigmoid junction: Secondary | ICD-10-CM | POA: Insufficient documentation

## 2020-11-06 DIAGNOSIS — C187 Malignant neoplasm of sigmoid colon: Secondary | ICD-10-CM

## 2020-11-06 DIAGNOSIS — I10 Essential (primary) hypertension: Secondary | ICD-10-CM

## 2020-11-06 DIAGNOSIS — R42 Dizziness and giddiness: Secondary | ICD-10-CM | POA: Diagnosis not present

## 2020-11-06 LAB — CMP (CANCER CENTER ONLY)
ALT: 19 U/L (ref 0–44)
AST: 19 U/L (ref 15–41)
Albumin: 4.1 g/dL (ref 3.5–5.0)
Alkaline Phosphatase: 63 U/L (ref 38–126)
Anion gap: 8 (ref 5–15)
BUN: 12 mg/dL (ref 6–20)
CO2: 27 mmol/L (ref 22–32)
Calcium: 9.2 mg/dL (ref 8.9–10.3)
Chloride: 104 mmol/L (ref 98–111)
Creatinine: 1.06 mg/dL (ref 0.61–1.24)
GFR, Estimated: >60 mL/min (ref >60)
Glucose, Bld: 86 mg/dL (ref 70–99)
Potassium: 3.8 mmol/L (ref 3.5–5.1)
Sodium: 139 mmol/L (ref 135–145)
Total Bilirubin: 0.3 mg/dL (ref 0.3–1.2)
Total Protein: 7.5 g/dL (ref 6.5–8.1)

## 2020-11-06 LAB — CBC WITH DIFFERENTIAL (CANCER CENTER ONLY)
Abs Immature Granulocytes: 0.01 10*3/uL (ref 0.00–0.07)
Basophils Absolute: 0.1 10*3/uL (ref 0.0–0.1)
Basophils Relative: 1 %
Eosinophils Absolute: 0.1 10*3/uL (ref 0.0–0.5)
Eosinophils Relative: 1 %
HCT: 42.2 % (ref 39.0–52.0)
Hemoglobin: 14.7 g/dL (ref 13.0–17.0)
Immature Granulocytes: 0 %
Lymphocytes Relative: 26 %
Lymphs Abs: 1.4 10*3/uL (ref 0.7–4.0)
MCH: 31.5 pg (ref 26.0–34.0)
MCHC: 34.8 g/dL (ref 30.0–36.0)
MCV: 90.4 fL (ref 80.0–100.0)
Monocytes Absolute: 0.5 10*3/uL (ref 0.1–1.0)
Monocytes Relative: 8 %
Neutro Abs: 3.6 10*3/uL (ref 1.7–7.7)
Neutrophils Relative %: 64 %
Platelet Count: 278 10*3/uL (ref 150–400)
RBC: 4.67 MIL/uL (ref 4.22–5.81)
RDW: 11.9 % (ref 11.5–15.5)
WBC Count: 5.6 10*3/uL (ref 4.0–10.5)
nRBC: 0 % (ref 0.0–0.2)

## 2020-11-07 ENCOUNTER — Encounter: Payer: Self-pay | Admitting: Hematology

## 2020-11-09 LAB — CEA (IN HOUSE-CHCC): CEA (CHCC-In House): 1.03 ng/mL (ref 0.00–5.00)

## 2021-01-15 ENCOUNTER — Encounter: Payer: Self-pay | Admitting: Genetic Counselor

## 2021-02-24 ENCOUNTER — Encounter: Payer: Self-pay | Admitting: Hematology

## 2021-02-24 NOTE — Progress Notes (Signed)
[place]   [phone] [phone]   Clinic Follow up Note   Patient Care Team: Chesley Noon, MD as PCP - General (Family Medicine) [name], MD (Cardiology) [name], MD as Attending Physician (Gastroenterology)  Date of Service:  02/26/2021  CHIEF COMPLAINT: f/u colon cancer  SUMMARY OF ONCOLOGIC HISTORY: Oncology History Overview Note  Cancer Staging Cancer of sigmoid colon [facility]) Staging form: Colon and Rectum, AJCC 8th Edition - Pathologic stage from 08/07/2018: Stage IIIB (pT3, pN1a, cM0) - Signed by Truitt Merle, MD on 10/25/2018     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 07/05/18  IMPRESSION - Tumor in the rectum. Biopsied. - Diverticulosis in the entire examined colon. - Two 5 to 6 mm polyps, removed with a cold snare. Resected and retrieved. - The examination was otherwise normal on direct and retroflexion views.    07/05/2018 Initial Biopsy   Diagnosis 07/05/18 1. Colon, polyp(s), hepatic flexure - TUBULAR ADENOMA (ONE). - NO HIGH GRADE DYSPLASIA OR MALIGNANCY. 2. Rectum, biopsy, mass - INVASIVE MODERATELY DIFFERENTIATED COLORECTAL ADENOCARCINOMA.   07/10/2018 Imaging   CT CAP W Constrast 07/10/18  IMPRESSION: 1. Mass within the rectosigmoid colon is identified compatible with findings from recent colonoscopy. 2. No evidence for nodal metastasis or distant metastatic disease. 3. Left kidney cyst.   07/17/2018 Imaging   MRI PElvis 07/17/18  IMPRESSION: 4.2 cm polypoid lesion along the right lateral aspect of the upper rectum, corresponding to the patient's biopsy-proven rectal adenocarcinoma, as above. Rectal adenocarcinoma T stage:  T1 or T2 Rectal adenocarcinoma N stage:  N0 Distance from tumor to the anal sphincter is 14  cm.   08/07/2018 Initial Diagnosis   Rectosigmoid cancer ([place])   08/07/2018 Surgery   XI ROBOTIC LOW ANTERIOR RESECTION by Dr. Kieth Brightly and Dr. Marcello Moores    08/07/2018 Pathologic Stage   Diagnosis 08/07/18 1. Colon, segmental resection for tumor, rectosigmoid - INVASIVE MODERATELY DIFFERENTIATED COLORECTAL ADENOCARCINOMA, 3.5 CM. - CARCINOMA FOCALLY INVOLVES PERICOLONIC CONNECTIVE TISSUE. - METASTATIC CARCINOMA IN ONE OF FIFTEEN LYMPH NODES (1/15). - MARGINS NOT INVOLVED. 2. Colon, resection margin (donut), distal ring, sigmoid - BENIGN COLON. - NO EVIDENCE OF MALIGNANCY.   08/07/2018 Cancer Staging   Staging form: Colon and Rectum, AJCC 8th Edition - Pathologic stage from 08/07/2018: Stage IIIB (pT3, pN1a, cM0) - Signed by Truitt Merle, MD on 10/25/2018   08/29/2018 -  Chemotherapy   Adjuvant CAPOX every 3 weeks with Xeloda 2064m twice daily 2 weeks on 1 week off starting 08/29/18. D/c Oxaliplatin after 09/28/18 (cycle 2) due to very poor toleration. Increased to 20072min AM and 250012mn PM starting with cycle 5.  Last 8th cycle dose was taken 02/26/19.    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  The Multi-Gene Panel offered by [facility] includes sequencing and/or deletion duplication testing of the following 84 genes: AIP, ALK, APC, ATM, AXIN2,BAP1,  BARD1, BLM, BMPR1A, BRCA1, BRCA2, BRIP1, CASR, CDC73, CDH1, CDK4, CDKN1B, CDKN1C, CDKN2A (p14ARF), CDKN2A (p16INK4a), CEBPA, CHEK2, CTNNA1, DICER1, DIS3L2, EGFR (c.2369C>T, p.Thr790Met variant only), EPCAM (Deletion/duplication testing only), FH, FLCN, GATA2, GPC3, GREM1 (Promoter region deletion/duplication testing only), HOXB13 (c.251G>A, p.Gly84Glu), HRAS, KIT, MAX, MEN1, MET, MITF (c.952G>A, p.Glu318Lys variant only), MLH1, MSH2, MSH3, MSH6, MUTYH, NBN, NF1, NF2, NTHL1, PALB2, PDGFRA, PHOX2B,  PMS2, POLD1, POLE, POT1, PRKAR1A, PTCH1, PTEN, RAD50, RAD51C, RAD51D, RB1, RECQL4, RET, RUNX1, SDHAF2, SDHA  (sequence changes only), SDHB, SDHC, SDHD, SMAD4, SMARCA4, SMARCB1, SMARCE1, STK11, SUFU, TERC, TERT, TMEM127, TP53, TSC1, TSC2, VHL, WRN and WT1.  The report date is 11/22/2018.  UPDATE: MSH6 c.94G>T (p.Gly32Cys) VUS was reclassified as Likely Benign.  The updated report date is January 10, 2021.   03/20/2019 Imaging   CT CAP 03/20/19  IMPRESSION: 1.  Interval postoperative findings of rectal resection.  2. No definite evidence of metastatic disease in the chest, abdomen, or pelvis.  3. No change in small prominent retroperitoneal lymph nodes. Attention on follow-up.  4. There is a subtle subcapsular hyperdensity of the dome of the liver, likely focal fatty sparing (series 2, image 49). Attention on follow-up.   07/25/2019 Procedure   Colonoscopy by [name] 07/25/19  IMPRESSION - Diverticulosis. Surgical anastomosis at 10 cm. Evidence of neoplasm. - The examination was otherwise normal on direct and retroflexion views. - No specimens collected.   09/20/2019 Imaging   CT AP W Contrast 09/20/19  IMPRESSION: Redemonstrated postoperative findings of rectosigmoid resection and reanastomosis. No evidence of recurrent or metastatic disease in the abdomen or pelvis.   06/23/2020 Imaging   CT CAP W contrast  IMPRESSION: 1. Redemonstrated postoperative findings of rectosigmoid resection and reanastomosis. 2. No evidence of recurrent or metastatic disease in the chest, abdomen, or pelvis.   06/23/2020 Imaging   CT head  IMPRESSION: No evidence of acute intracranial abnormality or intracranial metastatic disease.   1.7 x 2.7 x 3.3 cm posterior fossa arachnoid cyst, unchanged in size as compared to the MRI of 08/29/2013.   Mild ethmoid sinus mucosal thickening.        CURRENT THERAPY:  Surveillance  INTERVAL HISTORY:  Jacob Hayes is here for a follow up of colon cancer. He was last seen by me 4 months ago. He presents to the clinic alone. He notes he is still fasting  today so far to complete lipid labs. He notes neuropathy only after long term muscle contraction or in cold temperatures. He notes his triglycerides were recently increased. He plans to have lipid panel today for further evaluation.   He notes he got COVID end of January along with his kids. He notes he was symptomatic with cough and did not get treatment for COVID infection. He notes he had COVID booster before. He was able to recover completely. He notes he has mild tickle in his throat remaining. He notes his work no longer requires masks.    REVIEW OF SYSTEMS:   Constitutional: Denies fevers, chills or abnormal weight loss Eyes: Denies blurriness of vision Ears, nose, mouth, throat, and face: Denies mucositis or sore throat Respiratory: Denies cough, dyspnea or wheezes Cardiovascular: Denies palpitation, chest discomfort or lower extremity swelling Gastrointestinal:  Denies nausea, heartburn or change in bowel habits Skin: Denies abnormal skin rashes Lymphatics: Denies new lymphadenopathy or easy bruising Neurological: (+) Minimal neuropathy in hands and feet Behavioral/Psych: Mood is stable, no new changes  All other systems were reviewed with the patient and are negative.  MEDICAL HISTORY:  Past Medical History:  Diagnosis Date  . Allergic rhinitis   . Arachnoid cyst    chronic and stable per oncology note pt was had work-up at [facility]  . Bruxism   . Colon cancer [facility]) oncologist--- dr Burr Medico   dx 09/ 2019 rectosigmoid cancer, Stage IIIB;  s/p  lower anterior colon resection 08-07-2018 and completed chemo  04/ 2020  . Essential hypertension, benign    followed by pcp  (pt had ETT done 08-03-2012 in epic, no ischemia)  . Family history of breast cancer   . Family history of melanoma   . GERD   . Hyperlipidemia    Since age 84  . Hypothyroidism, postradioiodine therapy    followed by pcp---  s/p  RAI 2014  . Meniere's disease   . Migraine   . PONV (postoperative nausea and  vomiting)     SURGICAL HISTORY: Past Surgical History:  Procedure Laterality Date  . BIOPSY N/A 03/20/2013   Procedure: BIOPSY;  Surgeon: [name], MD;  [facility];  Service: Endoscopy;  Laterality: N/A;  Possible small bowel biopsy  . BIOPSY  07/05/2018   Procedure: BIOPSY;  Surgeon: [name], MD;  [facility];  Service: Endoscopy;;  rectal mass  . COLONOSCOPY WITH PROPOFOL N/A 07/05/2018   Procedure: COLONOSCOPY WITH PROPOFOL;  Surgeon: [name], MD;  [facility];  Service: Endoscopy;  Laterality: N/A;  8:30am  . COLONOSCOPY WITH PROPOFOL N/A 07/25/2019   Procedure: COLONOSCOPY WITH PROPOFOL;  Surgeon: [name], MD;  [facility];  Service: Endoscopy;  Laterality: N/A;  2:15pm  . ESOPHAGOGASTRODUODENOSCOPY N/A 03/20/2013   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  One slightly nodular.  Small hiatal hernia.  Multiple fundic gland appearing polyps. no evidence of celiac disease, malignancy, h.pylori, barrett's.   Marland Kitchen POLYPECTOMY  07/05/2018   Procedure: POLYPECTOMY;  Surgeon: [name], MD;  [facility];  Service: Endoscopy;;  hepatic flexurex2;  . PORT-A-CATH REMOVAL N/A 07/31/2020   Procedure: REMOVAL PORT-A-CATH;  Surgeon: [name], MD;  Location: [facility];  Service: General;  Laterality: N/A;  . PORTACATH PLACEMENT N/A 08/23/2018   Procedure: INSERTION PORT-A-CATH;  Surgeon: [name], MD;  Location: [facility];  Service: General;  Laterality: N/A;  LMA  . XI ROBOTIC ASSISTED LOWER ANTERIOR RESECTION  08-07-2018   [name]  '@[facility]'     I have reviewed the social history and family history with the patient and they are unchanged from previous note.  ALLERGIES:  has No Known Allergies.  MEDICATIONS:  Current Outpatient Medications  Medication Sig Dispense Refill  .  acetaminophen (TYLENOL) 500 MG tablet Take 1,000 mg by mouth every 6 (six) hours as needed for moderate pain or headache.     . Ascorbic Acid (VITAMIN C) 1000 MG tablet Take 1,000 mg by mouth daily.    Marland Kitchen b complex vitamins capsule Take 1 capsule by mouth daily.    . cetirizine (ZYRTEC) 10 MG tablet Take 10 mg by mouth daily as needed for allergies.     . diazepam (VALIUM) 5 MG tablet Take 2.5 mg by mouth at bedtime.   1  . diphenhydrAMINE (BENADRYL) 25 MG tablet Take 25-50 mg by mouth 2 (two) times daily as needed for allergies.    . diphenhydramine-acetaminophen (TYLENOL PM) 25-500 MG TABS tablet Take 1 tablet by mouth at bedtime as needed (sleep).    . fluticasone (FLONASE) 50 MCG/ACT nasal spray Place 2 sprays into the nose daily. (Patient taking differently: Place 2 sprays into the nose daily as needed (for seasonal allergies.). ) 16 g 11  . LORazepam (ATIVAN) 1 MG tablet Take 1 mg by mouth every 8 (eight) hours as needed for anxiety.  (Patient  not taking: Reported on 08/12/2020)    . losartan-hydrochlorothiazide (HYZAAR) 100-12.5 MG tablet Take 0.5 tablets by mouth 2 (two) times daily.     . Magnesium 400 MG TABS Take 1 tablet by mouth in the morning and at bedtime. Per pt takes for dizziness    . meclizine (ANTIVERT) 25 MG tablet Take 25 mg by mouth 3 (three) times daily as needed for dizziness.    . methimazole (TAPAZOLE) 5 MG tablet Take 5 mg by mouth 2 (two) times daily.     . Multiple Vitamin (MULTIVITAMIN WITH MINERALS) TABS tablet Take 1 tablet by mouth daily. Men's One-A-Day    . pantoprazole (PROTONIX) 40 MG tablet TAKE ONE TABLET BY MOUTH TWICE DAILY BEFORE A MEAL (Patient taking differently: Take 40 mg by mouth daily. ) 60 tablet 0  . simvastatin (ZOCOR) 40 MG tablet Take 40 mg by mouth every evening.      No current facility-administered medications for this visit.   Facility-Administered Medications Ordered in Other Visits  Medication Dose Route Frequency Provider Last Rate Last  Admin  . influenza vac split quadrivalent PF (FLUARIX) injection 0.5 mL  0.5 mL Intramuscular Once Ventura Sellers, MD        PHYSICAL EXAMINATION: ECOG PERFORMANCE STATUS: 0 - Asymptomatic  Vitals:   02/26/21 1544  BP: 129/89  Pulse: 77  Resp: 20  Temp: (!) 97.2 F (36.2 C)  SpO2: 98%   Filed Weights   02/26/21 1544  Weight: 217 lb 3.2 oz (98.5 kg)    GENERAL:alert, no distress and comfortable SKIN: skin color, texture, turgor are normal, no rashes or significant lesions EYES: normal, Conjunctiva are pink and non-injected, sclera clear  NECK: supple, thyroid normal size, non-tender, without nodularity LYMPH:  no palpable lymphadenopathy in the cervical, axillary  LUNGS: clear to auscultation and percussion with normal breathing effort HEART: regular rate & rhythm and no murmurs and no lower extremity edema ABDOMEN:abdomen soft, non-tender and normal bowel sounds Musculoskeletal:no cyanosis of digits and no clubbing  NEURO: alert & oriented x 3 with fluent speech, no focal motor/sensory deficits  LABORATORY DATA:  I have reviewed the data as listed CBC Latest Ref Rng & Units 02/26/2021 11/06/2020 07/31/2020  WBC 4.0 - 10.5 K/uL 5.6 5.6 -  Hemoglobin 13.0 - 17.0 g/dL 14.6 14.7 15.0  Hematocrit 39.0 - 52.0 % 40.9 42.2 44.0  Platelets 150 - 400 K/uL 263 278 -     CMP Latest Ref Rng & Units 02/26/2021 11/06/2020 07/31/2020  Glucose 70 - 99 mg/dL 89 86 97  BUN 6 - 20 mg/dL '11 12 15  ' Creatinine 0.61 - 1.24 mg/dL 1.04 1.06 1.00  Sodium 135 - 145 mmol/L 139 139 140  Potassium 3.5 - 5.1 mmol/L 3.8 3.8 3.7  Chloride 98 - 111 mmol/L 103 104 103  CO2 22 - 32 mmol/L 24 27 -  Calcium 8.9 - 10.3 mg/dL 8.9 9.2 -  Total Protein 6.5 - 8.1 g/dL 7.4 7.5 -  Total Bilirubin 0.3 - 1.2 mg/dL 0.8 0.3 -  Alkaline Phos 38 - 126 U/L 60 63 -  AST 15 - 41 U/L 21 19 -  ALT 0 - 44 U/L 19 19 -      RADIOGRAPHIC STUDIES: I have personally reviewed the radiological images as listed and agreed  with the findings in the report. No results found.   ASSESSMENT & PLAN:  Jacob Hayes is a 40 y.o. male with    1. Rectosigmoid Cancer,Stage IIIB(pT3N1M0), MSS -  He was diagnosed in 06/2018.He is s/p anterior resectionwith clear margins.Due to the tumor location mainly in sigmoid colon, radiation was not recommended. -Due to the high risk of recurrence, he startedadjuvant chemotherapy withCAPOX. However he toleratedOxaliplatinpoorly and was stopped after 2 cycles. Hewas able to complete8 cyclesXeloda(including CAPOX).His surveillanceColonoscopyfrom 07/25/19 was benign. -He is clinically doing well. Labs reviewed, CBC and CMP WNL. Physical exam unremarkable. CEA is still pending. There is no clinical concern for recurrence.  -He is over 2.5 years since his cancer diagnosis. His risk of recurrence is will decrease significantly after 3 years. Continue surveillance. Next colonoscopy in 2023. Next Scan in 06/2021, likely last scan.  -He has port removed.  -F/u in 4 months.   2. HTN -Controlled on Losartan and HCTZ. Continue to f/u with Dr. Domenic Polite.  3. Genetic TestingNegative  4.Arachnoid cyst, headaches, dizziness -He notes he has an arachnoid cyst in 2014 seen on CT head. His CT Head from 06/23/20 showsstable cyst  -He notesnausea andeasily gets motion sickness even when still and his headaches continue to worsen. This is effecting his daily life. -He is following Dr Mickeal Skinner and Neurologist at [facility] S/p vestibular rehabilitation and occipital neuralgia (he received injections at [facility] since 07/2020)  -He notes occipital nerve block was done.   5. COVID (+) in late 11/2020. Symptomatic with cough. Did not receive treatment. Has recovered.     PLAN: -F/u in 4 months with lab and CT CAP w contrast a few days before.    No problem-specific Assessment & Plan notes found for this encounter.   Orders Placed This Encounter  Procedures  . CT CHEST ABDOMEN PELVIS W  CONTRAST    Standing Status:   Future    Standing Expiration Date:   02/26/2022    Order Specific Question:   If indicated for the ordered procedure, I authorize the administration of contrast media per Radiology protocol    Answer:   Yes    Order Specific Question:   Preferred imaging location?    Answer:   [facility]    Order Specific Question:   Is Oral Contrast requested for this exam?    Answer:   Yes, Per Radiology protocol    Order Specific Question:   Reason for Exam (SYMPTOM  OR DIAGNOSIS REQUIRED)    Answer:   rule out recurrence   All questions were answered. The patient knows to call the clinic with any problems, questions or concerns. No barriers to learning was detected. The total time spent in the appointment was 25 minutes.     Truitt Merle, MD 02/26/2021   I, Joslyn Devon, am acting as scribe for Truitt Merle, MD.   I have reviewed the above documentation for accuracy and completeness, and I agree with the above.

## 2021-02-25 ENCOUNTER — Other Ambulatory Visit: Payer: Self-pay

## 2021-02-25 DIAGNOSIS — E785 Hyperlipidemia, unspecified: Secondary | ICD-10-CM

## 2021-02-25 DIAGNOSIS — R7989 Other specified abnormal findings of blood chemistry: Secondary | ICD-10-CM

## 2021-02-25 DIAGNOSIS — Z125 Encounter for screening for malignant neoplasm of prostate: Secondary | ICD-10-CM

## 2021-02-26 ENCOUNTER — Inpatient Hospital Stay: Payer: Commercial Managed Care - PPO

## 2021-02-26 ENCOUNTER — Inpatient Hospital Stay: Payer: Commercial Managed Care - PPO | Attending: Hematology | Admitting: Hematology

## 2021-02-26 ENCOUNTER — Other Ambulatory Visit: Payer: Self-pay

## 2021-02-26 VITALS — BP 129/89 | HR 77 | Temp 97.2°F | Resp 20 | Ht 75.0 in | Wt 217.2 lb

## 2021-02-26 DIAGNOSIS — Z8616 Personal history of COVID-19: Secondary | ICD-10-CM | POA: Insufficient documentation

## 2021-02-26 DIAGNOSIS — C19 Malignant neoplasm of rectosigmoid junction: Secondary | ICD-10-CM | POA: Diagnosis not present

## 2021-02-26 DIAGNOSIS — I10 Essential (primary) hypertension: Secondary | ICD-10-CM | POA: Diagnosis not present

## 2021-02-26 DIAGNOSIS — C187 Malignant neoplasm of sigmoid colon: Secondary | ICD-10-CM

## 2021-02-26 LAB — CMP (CANCER CENTER ONLY)
ALT: 19 U/L (ref 0–44)
AST: 21 U/L (ref 15–41)
Albumin: 4.4 g/dL (ref 3.5–5.0)
Alkaline Phosphatase: 60 U/L (ref 38–126)
Anion gap: 12 (ref 5–15)
BUN: 11 mg/dL (ref 6–20)
CO2: 24 mmol/L (ref 22–32)
Calcium: 8.9 mg/dL (ref 8.9–10.3)
Chloride: 103 mmol/L (ref 98–111)
Creatinine: 1.04 mg/dL (ref 0.61–1.24)
GFR, Estimated: >60 mL/min (ref >60)
Glucose, Bld: 89 mg/dL (ref 70–99)
Potassium: 3.8 mmol/L (ref 3.5–5.1)
Sodium: 139 mmol/L (ref 135–145)
Total Bilirubin: 0.8 mg/dL (ref 0.3–1.2)
Total Protein: 7.4 g/dL (ref 6.5–8.1)

## 2021-02-26 LAB — CBC WITH DIFFERENTIAL (CANCER CENTER ONLY)
Abs Immature Granulocytes: 0.01 10*3/uL (ref 0.00–0.07)
Basophils Absolute: 0 10*3/uL (ref 0.0–0.1)
Basophils Relative: 1 %
Eosinophils Absolute: 0 10*3/uL (ref 0.0–0.5)
Eosinophils Relative: 1 %
HCT: 40.9 % (ref 39.0–52.0)
Hemoglobin: 14.6 g/dL (ref 13.0–17.0)
Immature Granulocytes: 0 %
Lymphocytes Relative: 25 %
Lymphs Abs: 1.4 10*3/uL (ref 0.7–4.0)
MCH: 31.8 pg (ref 26.0–34.0)
MCHC: 35.7 g/dL (ref 30.0–36.0)
MCV: 89.1 fL (ref 80.0–100.0)
Monocytes Absolute: 0.4 10*3/uL (ref 0.1–1.0)
Monocytes Relative: 8 %
Neutro Abs: 3.7 10*3/uL (ref 1.7–7.7)
Neutrophils Relative %: 65 %
Platelet Count: 263 10*3/uL (ref 150–400)
RBC: 4.59 MIL/uL (ref 4.22–5.81)
RDW: 11.9 % (ref 11.5–15.5)
WBC Count: 5.6 10*3/uL (ref 4.0–10.5)
nRBC: 0 % (ref 0.0–0.2)

## 2021-03-01 LAB — CEA (IN HOUSE-CHCC): CEA (CHCC-In House): 1.35 ng/mL (ref 0.00–5.00)

## 2021-03-03 ENCOUNTER — Encounter: Payer: Self-pay | Admitting: Hematology

## 2021-06-24 ENCOUNTER — Telehealth: Payer: Self-pay

## 2021-06-24 NOTE — Telephone Encounter (Signed)
This nurse received a message from this patient requesting if a fasting lipid panel and TSH can be drawn with his next scheduled labs on 07/27/21.  His PCP is requesting the labs and results to be faxed to their office.  Request forwarded to MD for approval.

## 2021-06-29 ENCOUNTER — Other Ambulatory Visit: Payer: Commercial Managed Care - PPO

## 2021-07-01 ENCOUNTER — Other Ambulatory Visit: Payer: Self-pay

## 2021-07-01 DIAGNOSIS — E785 Hyperlipidemia, unspecified: Secondary | ICD-10-CM

## 2021-07-01 DIAGNOSIS — R7989 Other specified abnormal findings of blood chemistry: Secondary | ICD-10-CM

## 2021-07-02 ENCOUNTER — Ambulatory Visit: Payer: Commercial Managed Care - PPO | Admitting: Hematology

## 2021-07-09 ENCOUNTER — Encounter: Payer: Self-pay | Admitting: Hematology

## 2021-07-23 ENCOUNTER — Encounter: Payer: Self-pay | Admitting: Hematology

## 2021-07-23 ENCOUNTER — Other Ambulatory Visit: Payer: Self-pay

## 2021-07-23 DIAGNOSIS — Z125 Encounter for screening for malignant neoplasm of prostate: Secondary | ICD-10-CM

## 2021-07-27 ENCOUNTER — Ambulatory Visit (HOSPITAL_COMMUNITY)
Admission: RE | Admit: 2021-07-27 | Discharge: 2021-07-27 | Disposition: A | Discharge status: Home / Self Care | Payer: Commercial Managed Care - PPO | Source: Ambulatory Visit | Attending: Hematology | Admitting: Hematology

## 2021-07-27 ENCOUNTER — Inpatient Hospital Stay: Payer: Commercial Managed Care - PPO | Attending: Hematology

## 2021-07-27 ENCOUNTER — Other Ambulatory Visit: Payer: Self-pay

## 2021-07-27 ENCOUNTER — Encounter (HOSPITAL_COMMUNITY): Payer: Self-pay

## 2021-07-27 DIAGNOSIS — Z125 Encounter for screening for malignant neoplasm of prostate: Secondary | ICD-10-CM

## 2021-07-27 DIAGNOSIS — H9319 Tinnitus, unspecified ear: Secondary | ICD-10-CM | POA: Diagnosis not present

## 2021-07-27 DIAGNOSIS — R7989 Other specified abnormal findings of blood chemistry: Secondary | ICD-10-CM

## 2021-07-27 DIAGNOSIS — R1310 Dysphagia, unspecified: Secondary | ICD-10-CM | POA: Insufficient documentation

## 2021-07-27 DIAGNOSIS — C187 Malignant neoplasm of sigmoid colon: Secondary | ICD-10-CM | POA: Diagnosis not present

## 2021-07-27 DIAGNOSIS — R42 Dizziness and giddiness: Secondary | ICD-10-CM | POA: Insufficient documentation

## 2021-07-27 DIAGNOSIS — M542 Cervicalgia: Secondary | ICD-10-CM | POA: Diagnosis not present

## 2021-07-27 DIAGNOSIS — Z79899 Other long term (current) drug therapy: Secondary | ICD-10-CM | POA: Insufficient documentation

## 2021-07-27 DIAGNOSIS — E785 Hyperlipidemia, unspecified: Secondary | ICD-10-CM

## 2021-07-27 DIAGNOSIS — I10 Essential (primary) hypertension: Secondary | ICD-10-CM | POA: Insufficient documentation

## 2021-07-27 DIAGNOSIS — Z8616 Personal history of COVID-19: Secondary | ICD-10-CM | POA: Insufficient documentation

## 2021-07-27 LAB — LIPID PANEL
Cholesterol: 220 mg/dL — ABNORMAL HIGH (ref 0–200)
HDL: 42 mg/dL (ref >40)
LDL Cholesterol: 110 mg/dL — ABNORMAL HIGH (ref 0–99)
Total CHOL/HDL Ratio: 5.2 RATIO
Triglycerides: 339 mg/dL — ABNORMAL HIGH (ref <150)
VLDL: 68 mg/dL — ABNORMAL HIGH (ref 0–40)

## 2021-07-27 LAB — CMP (CANCER CENTER ONLY)
ALT: 17 U/L (ref 0–44)
AST: 17 U/L (ref 15–41)
Albumin: 4.2 g/dL (ref 3.5–5.0)
Alkaline Phosphatase: 64 U/L (ref 38–126)
Anion gap: 11 (ref 5–15)
BUN: 11 mg/dL (ref 6–20)
CO2: 25 mmol/L (ref 22–32)
Calcium: 9.3 mg/dL (ref 8.9–10.3)
Chloride: 104 mmol/L (ref 98–111)
Creatinine: 1.2 mg/dL (ref 0.61–1.24)
GFR, Estimated: >60 mL/min (ref >60)
Glucose, Bld: 90 mg/dL (ref 70–99)
Potassium: 4 mmol/L (ref 3.5–5.1)
Sodium: 140 mmol/L (ref 135–145)
Total Bilirubin: 0.3 mg/dL (ref 0.3–1.2)
Total Protein: 7.3 g/dL (ref 6.5–8.1)

## 2021-07-27 LAB — CBC WITH DIFFERENTIAL (CANCER CENTER ONLY)
Abs Immature Granulocytes: 0 10*3/uL (ref 0.00–0.07)
Basophils Absolute: 0.1 10*3/uL (ref 0.0–0.1)
Basophils Relative: 1 %
Eosinophils Absolute: 0.1 10*3/uL (ref 0.0–0.5)
Eosinophils Relative: 1 %
HCT: 43 % (ref 39.0–52.0)
Hemoglobin: 15.2 g/dL (ref 13.0–17.0)
Immature Granulocytes: 0 %
Lymphocytes Relative: 28 %
Lymphs Abs: 1 10*3/uL (ref 0.7–4.0)
MCH: 31.6 pg (ref 26.0–34.0)
MCHC: 35.3 g/dL (ref 30.0–36.0)
MCV: 89.4 fL (ref 80.0–100.0)
Monocytes Absolute: 0.3 10*3/uL (ref 0.1–1.0)
Monocytes Relative: 9 %
Neutro Abs: 2.2 10*3/uL (ref 1.7–7.7)
Neutrophils Relative %: 61 %
Platelet Count: 259 10*3/uL (ref 150–400)
RBC: 4.81 MIL/uL (ref 4.22–5.81)
RDW: 12.4 % (ref 11.5–15.5)
WBC Count: 3.6 10*3/uL — ABNORMAL LOW (ref 4.0–10.5)
nRBC: 0 % (ref 0.0–0.2)

## 2021-07-27 LAB — TSH: TSH: 2.723 u[IU]/mL (ref 0.320–4.118)

## 2021-07-27 MED ORDER — IOHEXOL 350 MG/ML SOLN
80.0000 mL | Freq: Once | INTRAVENOUS | Status: AC | PRN
  Administered 2021-07-27: 80 mL via INTRAVENOUS

## 2021-07-28 LAB — PROSTATE-SPECIFIC AG, SERUM (LABCORP): Prostate Specific Ag, Serum: 0.9 ng/mL (ref 0.0–4.0)

## 2021-07-30 ENCOUNTER — Inpatient Hospital Stay (HOSPITAL_BASED_OUTPATIENT_CLINIC_OR_DEPARTMENT_OTHER): Payer: Commercial Managed Care - PPO | Admitting: Hematology

## 2021-07-30 ENCOUNTER — Other Ambulatory Visit: Payer: Self-pay

## 2021-07-30 VITALS — BP 128/92 | HR 111 | Temp 98.1°F | Resp 19 | Ht 75.0 in | Wt 222.1 lb

## 2021-07-30 DIAGNOSIS — C187 Malignant neoplasm of sigmoid colon: Secondary | ICD-10-CM

## 2021-07-30 NOTE — Progress Notes (Signed)
[place]   [phone] [phone]   Clinic Follow up Note   Patient Care Team: Chesley Noon, MD as PCP - General (Family Medicine) [name], MD (Cardiology) [name], MD as Attending Physician (Gastroenterology)  Date of Service:  07/30/2021  CHIEF COMPLAINT: f/u of colon cancer  CURRENT THERAPY:  Surveillance  ASSESSMENT & PLAN:  Michele Kerlin is a 40 y.o. male with   1. Rectosigmoid Cancer, Stage IIIB (pT3N1M0), MSS -He was diagnosed in 06/2018. He is s/p anterior resection with clear margins. Due to the tumor location mainly in sigmoid colon, radiation was not recommended.  -Due to the high risk of recurrence, he started adjuvant chemotherapy with CAPOX. However he tolerated Oxaliplatin poorly and was stopped after 2 cycles. He was able to complete 8 cycles Xeloda (including CAPOX). His surveillance Colonoscopy from 07/25/19 was benign. -most recent CT CAP on 07/27/21 showed NED. I reviewed the images and discussed with pt  -He is clinically doing well. Labs from 07/27/21 reviewed, CBC and CMP WNL.  -He is now 3 years out from diagnosis. Continue surveillance to complete 5 years. We will no longer routinely scan him. He will be due for surveillance colonoscopy in 2023. -F/u and lab in 6 months.    2. Dysphagia with liquids -he is on protonix for history of reflux -last EGD 03/20/13 was benign. -he only has trouble swallowing liquids, to the point of choking.  No dysphagia with -follow-up with GI.     3. Arachnoid cyst, headaches, dizziness and tinnitus  -He notes he has an arachnoid cyst in 2014 seen on CT head. His CT Head from 06/23/20 shows stable cyst  -He notes nausea and easily gets motion sickness even when still and his headaches continue to worsen. This is effecting his daily life.  -He is following Dr Mickeal Skinner and Neurologist at [facility] S/p vestibular rehabilitation and occipital neuralgia (he received injections at  [facility] since 07/2020)  -His headaches has improved, however he has noticed recurrent tinnitus, and neck discomfort.  I recommend him to follow-up with Dr. Mickeal Skinner.  4. HTN -Controlled on Losartan and HCTZ. Continue to f/u with Dr. Domenic Polite.    5. Genetic Testing Negative    6. COVID (+) in late 11/2020. Symptomatic with cough. Did not receive treatment. Has recovered well.       PLAN: -Lab and scan reviewed, no concern for recurrence -lab and f/u in 6 months -Follow-up with Dr. Mickeal Skinner, I sent a message to Dr. Mickeal Skinner     No problem-specific Assessment & Plan notes found for this encounter.   SUMMARY OF ONCOLOGIC HISTORY: Oncology History Overview Note  Cancer Staging Cancer of sigmoid colon [facility]) Staging form: Colon and Rectum, AJCC 8th Edition - Pathologic stage from 08/07/2018: Stage IIIB (pT3, pN1a, cM0) - Signed by Truitt Merle, MD on 10/25/2018     Primary adenocarcinoma of rectosigmoid junction ([place])  07/17/2018 Initial Diagnosis   Primary adenocarcinoma of rectosigmoid junction ([place])   Cancer of sigmoid colon ([place])  07/05/2018 Procedure   Colonoscopy by [name] 07/05/18  IMPRESSION - Tumor in the rectum. Biopsied. - Diverticulosis in the entire examined colon. - Two 5 to 6 mm polyps, removed with a cold snare. Resected and retrieved. - The examination was otherwise normal on direct and retroflexion views.    07/05/2018 Initial Biopsy   Diagnosis 07/05/18 1. Colon, polyp(s), hepatic flexure - TUBULAR ADENOMA (ONE). - NO HIGH GRADE DYSPLASIA OR MALIGNANCY. 2. Rectum,  biopsy, mass - INVASIVE MODERATELY DIFFERENTIATED COLORECTAL ADENOCARCINOMA.   07/10/2018 Imaging   CT CAP W Constrast 07/10/18  IMPRESSION: 1. Mass within the rectosigmoid colon is identified compatible with findings from recent colonoscopy. 2. No evidence for nodal metastasis or distant metastatic disease. 3. Left kidney cyst.   07/17/2018 Imaging   MRI PElvis 07/17/18  IMPRESSION: 4.2 cm polypoid  lesion along the right lateral aspect of the upper rectum, corresponding to the patient's biopsy-proven rectal adenocarcinoma, as above. Rectal adenocarcinoma T stage:  T1 or T2 Rectal adenocarcinoma N stage:  N0 Distance from tumor to the anal sphincter is 14 cm.   08/07/2018 Initial Diagnosis   Rectosigmoid cancer ([place])   08/07/2018 Surgery   XI ROBOTIC LOW ANTERIOR RESECTION by Dr. Kieth Brightly and Dr. Marcello Moores    08/07/2018 Pathologic Stage   Diagnosis 08/07/18 1. Colon, segmental resection for tumor, rectosigmoid - INVASIVE MODERATELY DIFFERENTIATED COLORECTAL ADENOCARCINOMA, 3.5 CM. - CARCINOMA FOCALLY INVOLVES PERICOLONIC CONNECTIVE TISSUE. - METASTATIC CARCINOMA IN ONE OF FIFTEEN LYMPH NODES (1/15). - MARGINS NOT INVOLVED. 2. Colon, resection margin (donut), distal ring, sigmoid - BENIGN COLON. - NO EVIDENCE OF MALIGNANCY.   08/07/2018 Cancer Staging   Staging form: Colon and Rectum, AJCC 8th Edition - Pathologic stage from 08/07/2018: Stage IIIB (pT3, pN1a, cM0) - Signed by Truitt Merle, MD on 10/25/2018   08/29/2018 -  Chemotherapy   Adjuvant CAPOX every 3 weeks with Xeloda 2026m twice daily 2 weeks on 1 week off starting 08/29/18. D/c Oxaliplatin after 09/28/18 (cycle 2) due to very poor toleration. Increased to 20015min AM and 250071mn PM starting with cycle 5.  Last 8th cycle dose was taken 02/26/19.    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  The Multi-Gene Panel offered by [facility] includes sequencing and/or deletion duplication testing of the following 84 genes: AIP, ALK, APC, ATM, AXIN2,BAP1,  BARD1, BLM, BMPR1A, BRCA1, BRCA2, BRIP1, CASR, CDC73, CDH1, CDK4, CDKN1B, CDKN1C, CDKN2A (p14ARF), CDKN2A (p16INK4a), CEBPA, CHEK2, CTNNA1, DICER1, DIS3L2, EGFR (c.2369C>T, p.Thr790Met variant only), EPCAM (Deletion/duplication testing only), FH, FLCN, GATA2, GPC3, GREM1 (Promoter region deletion/duplication testing only), HOXB13 (c.251G>A,  p.Gly84Glu), HRAS, KIT, MAX, MEN1, MET, MITF (c.952G>A, p.Glu318Lys variant only), MLH1, MSH2, MSH3, MSH6, MUTYH, NBN, NF1, NF2, NTHL1, PALB2, PDGFRA, PHOX2B, PMS2, POLD1, POLE, POT1, PRKAR1A, PTCH1, PTEN, RAD50, RAD51C, RAD51D, RB1, RECQL4, RET, RUNX1, SDHAF2, SDHA (sequence changes only), SDHB, SDHC, SDHD, SMAD4, SMARCA4, SMARCB1, SMARCE1, STK11, SUFU, TERC, TERT, TMEM127, TP53, TSC1, TSC2, VHL, WRN and WT1.  The report date is 11/22/2018.  UPDATE: MSH6 c.94G>T (p.Gly32Cys) VUS was reclassified as Likely Benign.  The updated report date is January 10, 2021.   03/20/2019 Imaging   CT CAP 03/20/19  IMPRESSION: 1.  Interval postoperative findings of rectal resection.   2. No definite evidence of metastatic disease in the chest, abdomen, or pelvis.   3. No change in small prominent retroperitoneal lymph nodes. Attention on follow-up.   4. There is a subtle subcapsular hyperdensity of the dome of the liver, likely focal fatty sparing (series 2, image 49). Attention on follow-up.   07/25/2019 Procedure   Colonoscopy by Dr. RouGala Romney3/20  IMPRESSION - Diverticulosis. Surgical anastomosis at 10 cm. Evidence of neoplasm. - The examination was otherwise normal on direct and retroflexion views. - No specimens collected.   09/20/2019 Imaging   CT AP W Contrast 09/20/19  IMPRESSION: Redemonstrated postoperative findings of rectosigmoid resection and reanastomosis. No evidence of recurrent or metastatic disease in the  abdomen or pelvis.   06/23/2020 Imaging   CT CAP W contrast  IMPRESSION: 1. Redemonstrated postoperative findings of rectosigmoid resection and reanastomosis. 2. No evidence of recurrent or metastatic disease in the chest, abdomen, or pelvis.   06/23/2020 Imaging   CT head  IMPRESSION: No evidence of acute intracranial abnormality or intracranial metastatic disease.   1.7 x 2.7 x 3.3 cm posterior fossa arachnoid cyst, unchanged in size as compared to the MRI of 08/29/2013.    Mild ethmoid sinus mucosal thickening.        INTERVAL HISTORY:  Brevyn Ring is here for a follow up of colon cancer. He was last seen by me on 02/26/21. He presents to the clinic alone. He reports he continues to work hard full time. He reports, in regards to his occipital neuralgia, a lot of "noise in my neck." "My head feels out of alignment or something." He reports it sounds like someone crinkling aluminum foil in his ears. He also reports dysphagia with liquids, to the point of choking.   All other systems were reviewed with the patient and are negative.  MEDICAL HISTORY:  Past Medical History:  Diagnosis Date   Allergic rhinitis    Arachnoid cyst    chronic and stable per oncology note pt was had work-up at [facility] 2014   Bruxism    Colon cancer [facility]) oncologist--- dr Burr Medico   dx 09/ 2019 rectosigmoid cancer, Stage IIIB;  s/p  lower anterior colon resection 08-07-2018 and completed chemo 04/ 2020   Essential hypertension, benign    followed by pcp  (pt had ETT done 08-03-2012 in epic, no ischemia)   Family history of breast cancer    Family history of melanoma    GERD    Hyperlipidemia    Since age 26   Hypothyroidism, postradioiodine therapy    followed by pcp---  s/p  RAI 2014   Meniere's disease    Migraine    PONV (postoperative nausea and vomiting)     SURGICAL HISTORY: Past Surgical History:  Procedure Laterality Date   BIOPSY N/A 03/20/2013   Procedure: BIOPSY;  Surgeon: [name], MD;  [facility];  Service: Endoscopy;  Laterality: N/A;  Possible small bowel biopsy   BIOPSY  07/05/2018   Procedure: BIOPSY;  Surgeon: [name], MD;  [facility];  Service: Endoscopy;;  rectal mass   COLONOSCOPY WITH PROPOFOL N/A 07/05/2018   Procedure: COLONOSCOPY WITH PROPOFOL;  Surgeon: [name], MD;  [facility];  Service: Endoscopy;  Laterality: N/A;  8:30am   COLONOSCOPY WITH PROPOFOL N/A 07/25/2019   Procedure:  COLONOSCOPY WITH PROPOFOL;  Surgeon: [name], MD;  [facility];  Service: Endoscopy;  Laterality: N/A;  2:15pm   ESOPHAGOGASTRODUODENOSCOPY N/A 03/20/2013   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  One slightly nodular.  Small hiatal hernia.  Multiple fundic gland appearing polyps. no evidence of celiac disease, malignancy, h.pylori, barrett's.    POLYPECTOMY  07/05/2018   Procedure: POLYPECTOMY;  Surgeon: [name], MD;  [facility];  Service: Endoscopy;;  hepatic flexurex2;   PORT-A-CATH REMOVAL N/A 07/31/2020   Procedure: REMOVAL PORT-A-CATH;  Surgeon: [name], MD;  Location: [facility];  Service: General;  Laterality: N/A;   PORTACATH PLACEMENT N/A 08/23/2018   Procedure: INSERTION PORT-A-CATH;  Surgeon: [name], MD;  Location: [facility];  Service: General;  Laterality: N/A;  LMA   XI ROBOTIC ASSISTED LOWER ANTERIOR RESECTION  08-07-2018   [name]  _0     I have reviewed the social history and family history with the patient and they are unchanged from previous note.  ALLERGIES:  has No Known Allergies.  MEDICATIONS:  Current Outpatient Medications  Medication Sig Dispense Refill   acetaminophen (TYLENOL) 500 MG tablet Take 1,000 mg by mouth every 6 (six) hours as needed for moderate pain or headache.      Ascorbic Acid (VITAMIN C) 1000 MG tablet Take 1,000 mg by mouth daily.     b complex vitamins capsule Take 1 capsule by mouth daily.     cetirizine (ZYRTEC) 10 MG tablet Take 10 mg by mouth daily as needed for allergies.      diazepam (VALIUM) 5 MG tablet Take 2.5 mg by mouth at bedtime.   1   diphenhydrAMINE (BENADRYL) 25 MG tablet Take 25-50 mg by mouth 2 (two) times daily as needed for allergies.     diphenhydramine-acetaminophen (TYLENOL PM) 25-500 MG TABS tablet Take 1 tablet by mouth at bedtime as needed  (sleep).     fluticasone (FLONASE) 50 MCG/ACT nasal spray Place 2 sprays into the nose daily. (Patient taking differently: Place 2 sprays into the nose daily as needed (for seasonal allergies.). ) 16 g 11   LORazepam (ATIVAN) 1 MG tablet Take 1 mg by mouth every 8 (eight) hours as needed for anxiety.  (Patient not taking: Reported on 08/12/2020)     losartan-hydrochlorothiazide (HYZAAR) 100-12.5 MG tablet Take 0.5 tablets by mouth 2 (two) times daily.      Magnesium 400 MG TABS Take 1 tablet by mouth in the morning and at bedtime. Per pt takes for dizziness     meclizine (ANTIVERT) 25 MG tablet Take 25 mg by mouth 3 (three) times daily as needed for dizziness.     methimazole (TAPAZOLE) 5 MG tablet Take 5 mg by mouth 2 (two) times daily.      Multiple Vitamin (MULTIVITAMIN WITH MINERALS) TABS tablet Take 1 tablet by mouth daily. Men's One-A-Day     pantoprazole (PROTONIX) 40 MG tablet TAKE ONE TABLET BY MOUTH TWICE DAILY BEFORE A MEAL (Patient taking differently: Take 40 mg by mouth daily. ) 60 tablet 0   simvastatin (ZOCOR) 40 MG tablet Take 40 mg by mouth every evening.      No current facility-administered medications for this visit.   Facility-Administered Medications Ordered in Other Visits  Medication Dose Route Frequency Provider Last Rate Last Admin   influenza vac split quadrivalent PF (FLUARIX) injection 0.5 mL  0.5 mL Intramuscular Once Vaslow, Acey Lav, MD        PHYSICAL EXAMINATION: ECOG PERFORMANCE STATUS: 0 - Asymptomatic  There were no vitals filed for this visit. Wt Readings from Last 3 Encounters:  02/26/21 217 lb 3.2 oz (98.5 kg)  11/06/20 217 lb 4.8 oz (98.6 kg)  09/18/20 219 lb 12.8 oz (99.7 kg)     GENERAL:alert, no distress and comfortable SKIN: skin color normal, no rashes or significant lesions EYES: normal, Conjunctiva are pink and non-injected, sclera clear  NEURO: alert & oriented x 3 with fluent speech  LABORATORY DATA:  I have reviewed the data as  listed CBC Latest Ref Rng & Units 07/27/2021 02/26/2021 11/06/2020  WBC 4.0 - 10.5 K/uL 3.6(L) 5.6 5.6  Hemoglobin 13.0 - 17.0 g/dL 15.2 14.6 14.7  Hematocrit 39.0 - 52.0 % 43.0  40.9 42.2  Platelets 150 - 400 K/uL 259 263 278     CMP Latest Ref Rng & Units 07/27/2021 02/26/2021 11/06/2020  Glucose 70 - 99 mg/dL 90 89 86  BUN 6 - 20 mg/dL _0 Creatinine 0.61 - 1.24 mg/dL 1.20 1.04 1.06  Sodium 135 - 145 mmol/L 140 139 139  Potassium 3.5 - 5.1 mmol/L 4.0 3.8 3.8  Chloride 98 - 111 mmol/L 104 103 104  CO2 22 - 32 mmol/L _1 Calcium 8.9 - 10.3 mg/dL 9.3 8.9 9.2  Total Protein 6.5 - 8.1 g/dL 7.3 7.4 7.5  Total Bilirubin 0.3 - 1.2 mg/dL 0.3 0.8 0.3  Alkaline Phos 38 - 126 U/L 64 60 63  AST 15 - 41 U/L _2 ALT 0 - 44 U/L _3 RADIOGRAPHIC STUDIES: I have personally reviewed the radiological images as listed and agreed with the findings in the report. No results found.    No orders of the defined types were placed in this encounter.  All questions were answered. The patient knows to call the clinic with any problems, questions or concerns. No barriers to learning was detected. The total time spent in the appointment was 30 minutes.     Truitt Merle, MD 07/30/2021   I, Wilburn Mylar, am acting as scribe for Truitt Merle, MD.   I have reviewed the above documentation for accuracy and completeness, and I agree with the above.

## 2021-07-31 ENCOUNTER — Encounter: Payer: Self-pay | Admitting: Hematology

## 2022-01-19 ENCOUNTER — Other Ambulatory Visit: Payer: Self-pay

## 2022-01-28 ENCOUNTER — Inpatient Hospital Stay: Payer: Commercial Managed Care - PPO | Admitting: Hematology

## 2022-01-28 ENCOUNTER — Ambulatory Visit: Payer: Commercial Managed Care - PPO | Admitting: Hematology

## 2022-01-28 ENCOUNTER — Inpatient Hospital Stay: Payer: Commercial Managed Care - PPO

## 2022-01-28 ENCOUNTER — Other Ambulatory Visit: Payer: Commercial Managed Care - PPO

## 2022-01-28 ENCOUNTER — Ambulatory Visit: Payer: Commercial Managed Care - PPO | Admitting: Physician Assistant

## 2022-01-28 ENCOUNTER — Inpatient Hospital Stay (HOSPITAL_BASED_OUTPATIENT_CLINIC_OR_DEPARTMENT_OTHER): Payer: Commercial Managed Care - PPO | Admitting: Hematology

## 2022-01-28 ENCOUNTER — Other Ambulatory Visit: Payer: Self-pay

## 2022-01-28 ENCOUNTER — Inpatient Hospital Stay: Payer: Commercial Managed Care - PPO | Attending: Hematology

## 2022-01-28 VITALS — BP 121/85 | HR 85 | Temp 98.8°F | Resp 20 | Wt 215.8 lb

## 2022-01-28 DIAGNOSIS — C19 Malignant neoplasm of rectosigmoid junction: Secondary | ICD-10-CM | POA: Insufficient documentation

## 2022-01-28 DIAGNOSIS — I10 Essential (primary) hypertension: Secondary | ICD-10-CM | POA: Insufficient documentation

## 2022-01-28 DIAGNOSIS — G43009 Migraine without aura, not intractable, without status migrainosus: Secondary | ICD-10-CM | POA: Insufficient documentation

## 2022-01-28 DIAGNOSIS — Z8616 Personal history of COVID-19: Secondary | ICD-10-CM | POA: Insufficient documentation

## 2022-01-28 DIAGNOSIS — R55 Syncope and collapse: Secondary | ICD-10-CM | POA: Diagnosis not present

## 2022-01-28 DIAGNOSIS — Z79899 Other long term (current) drug therapy: Secondary | ICD-10-CM | POA: Insufficient documentation

## 2022-01-28 DIAGNOSIS — C187 Malignant neoplasm of sigmoid colon: Secondary | ICD-10-CM | POA: Diagnosis not present

## 2022-01-28 LAB — CBC WITH DIFFERENTIAL (CANCER CENTER ONLY)
Abs Immature Granulocytes: 0.01 10*3/uL (ref 0.00–0.07)
Basophils Absolute: 0.1 10*3/uL (ref 0.0–0.1)
Basophils Relative: 1 %
Eosinophils Absolute: 0 10*3/uL (ref 0.0–0.5)
Eosinophils Relative: 1 %
HCT: 40.5 % (ref 39.0–52.0)
Hemoglobin: 14.3 g/dL (ref 13.0–17.0)
Immature Granulocytes: 0 %
Lymphocytes Relative: 19 %
Lymphs Abs: 1.1 10*3/uL (ref 0.7–4.0)
MCH: 31.6 pg (ref 26.0–34.0)
MCHC: 35.3 g/dL (ref 30.0–36.0)
MCV: 89.4 fL (ref 80.0–100.0)
Monocytes Absolute: 0.4 10*3/uL (ref 0.1–1.0)
Monocytes Relative: 7 %
Neutro Abs: 4.3 10*3/uL (ref 1.7–7.7)
Neutrophils Relative %: 72 %
Platelet Count: 270 10*3/uL (ref 150–400)
RBC: 4.53 MIL/uL (ref 4.22–5.81)
RDW: 12.3 % (ref 11.5–15.5)
WBC Count: 5.8 10*3/uL (ref 4.0–10.5)
nRBC: 0 % (ref 0.0–0.2)

## 2022-01-28 LAB — CMP (CANCER CENTER ONLY)
ALT: 16 U/L (ref 0–44)
AST: 21 U/L (ref 15–41)
Albumin: 4.4 g/dL (ref 3.5–5.0)
Alkaline Phosphatase: 69 U/L (ref 38–126)
Anion gap: 6 (ref 5–15)
BUN: 10 mg/dL (ref 6–20)
CO2: 26 mmol/L (ref 22–32)
Calcium: 9.1 mg/dL (ref 8.9–10.3)
Chloride: 106 mmol/L (ref 98–111)
Creatinine: 1.02 mg/dL (ref 0.61–1.24)
GFR, Estimated: >60 mL/min (ref >60)
Glucose, Bld: 93 mg/dL (ref 70–99)
Potassium: 3.7 mmol/L (ref 3.5–5.1)
Sodium: 138 mmol/L (ref 135–145)
Total Bilirubin: 0.7 mg/dL (ref 0.3–1.2)
Total Protein: 6.9 g/dL (ref 6.5–8.1)

## 2022-01-28 LAB — CEA (IN HOUSE-CHCC): CEA (CHCC-In House): 1.23 ng/mL (ref 0.00–5.00)

## 2022-01-28 NOTE — Progress Notes (Signed)
Jacob Hayes   [phone] Fax:([phone]   Clinic Follow up Note   Patient Care Team: Chesley Noon, MD as PCP - General (Family Medicine) [name], MD (Cardiology) [name], MD as Attending Physician (Gastroenterology)  Date of Service:  01/28/2022  CHIEF COMPLAINT: f/u of colon cancer  CURRENT THERAPY:  Surveillance  ASSESSMENT & PLAN:  Jacob Hayes is a 41 y.o. male with   1. Rectosigmoid Cancer, Stage IIIB (pT3N1M0), MSS -He was diagnosed in 06/2018. He is s/p anterior resection with clear margins. Due to the tumor location mainly in sigmoid colon, radiation was not recommended.  -Due to the high risk of recurrence, he started adjuvant chemotherapy with CAPOX. However he tolerated Oxaliplatin poorly and was stopped after 2 cycles. He was able to complete 8 cycles Xeloda (including CAPOX). His surveillance Colonoscopy from 07/25/19 was benign. -most recent CT CAP on 07/27/21 showed NED. I reviewed the images and discussed with pt  -He is clinically doing well. Labs from 07/27/21 reviewed, CBC and CMP WNL.  -He is now 3.5 years out from diagnosis. Continue surveillance to complete 5 years. We will no longer routinely scan him. He will be due for surveillance colonoscopy in Sep 2023. He will call to schedule  -F/u and lab in 8 months.    2.  Recurrent syncope in airplane -He had recurrent episodes of sweating, dizziness and syncope when he was in airplane, recovered fully, no seizure activity  -will refer him back to neurology    3. Arachnoid cyst, headaches, dizziness and tinnitus  -He notes he has an arachnoid cyst in 2014 seen on CT head. His CT Head from 06/23/20 shows stable cyst  -He notes nausea and easily gets motion sickness even when still and his headaches continue to worsen. This is effecting his daily life.  -He is following Dr Mickeal Skinner and Neurologist at [facility]/p vestibular rehabilitation and occipital neuralgia (he  received injections at [facility] since 07/2020)  -His headaches has resolved after occipital nerve block  4. HTN -Controlled on Losartan and HCTZ. Continue to f/u with Dr. Domenic Polite.    5. Genetic Testing Negative    6. COVID (+) in late 11/2020. Symptomatic with cough. Did not receive treatment. Has recovered well.       PLAN: -He is clinically doing well, no concern for cancer recurrence -lab and f/u in 8 months -Follow-up with Dr. Mickeal Skinner for his recent episodes of syncope.  I will send a message to Dr. Mickeal Skinner     No problem-specific Assessment & Plan notes found for this encounter.   SUMMARY OF ONCOLOGIC HISTORY: Oncology History Overview Note  Cancer Staging Cancer of sigmoid colon [facility]) Staging form: Colon and Rectum, AJCC 8th Edition - Pathologic stage from 08/07/2018: Stage IIIB (pT3, pN1a, cM0) - Signed by Truitt Merle, MD on 10/25/2018     Primary adenocarcinoma of rectosigmoid junction ([place])  07/17/2018 Initial Diagnosis   Primary adenocarcinoma of rectosigmoid junction ([place])   Cancer of sigmoid colon ([place])  07/05/2018 Procedure   Colonoscopy by [name] 07/05/18  IMPRESSION - Tumor in the rectum. Biopsied. - Diverticulosis in the entire examined colon. - Two 5 to 6 mm polyps, removed with a cold snare. Resected and retrieved. - The examination was otherwise normal on direct and retroflexion views.    07/05/2018 Initial Biopsy   Diagnosis 07/05/18 1. Colon, polyp(s), hepatic flexure - TUBULAR ADENOMA (ONE). - NO HIGH GRADE DYSPLASIA OR MALIGNANCY. 2. Rectum, biopsy,  mass - INVASIVE MODERATELY DIFFERENTIATED COLORECTAL ADENOCARCINOMA.   07/10/2018 Imaging   CT CAP W Constrast 07/10/18  IMPRESSION: 1. Mass within the rectosigmoid colon is identified compatible with findings from recent colonoscopy. 2. No evidence for nodal metastasis or distant metastatic disease. 3. Left kidney cyst.   07/17/2018 Imaging   MRI PElvis 07/17/18  IMPRESSION: 4.2 cm polypoid lesion along  the right lateral aspect of the upper rectum, corresponding to the patient's biopsy-proven rectal adenocarcinoma, as above. Rectal adenocarcinoma T stage:  T1 or T2 Rectal adenocarcinoma N stage:  N0 Distance from tumor to the anal sphincter is 14 cm.   08/07/2018 Initial Diagnosis   Rectosigmoid cancer ([place])   08/07/2018 Surgery   XI ROBOTIC LOW ANTERIOR RESECTION by Dr. Kieth Brightly and Dr. Marcello Moores    08/07/2018 Pathologic Stage   Diagnosis 08/07/18 1. Colon, segmental resection for tumor, rectosigmoid - INVASIVE MODERATELY DIFFERENTIATED COLORECTAL ADENOCARCINOMA, 3.5 CM. - CARCINOMA FOCALLY INVOLVES PERICOLONIC CONNECTIVE TISSUE. - METASTATIC CARCINOMA IN ONE OF FIFTEEN LYMPH NODES (1/15). - MARGINS NOT INVOLVED. 2. Colon, resection margin (donut), distal ring, sigmoid - BENIGN COLON. - NO EVIDENCE OF MALIGNANCY.   08/07/2018 Cancer Staging   Staging form: Colon and Rectum, AJCC 8th Edition - Pathologic stage from 08/07/2018: Stage IIIB (pT3, pN1a, cM0) - Signed by Truitt Merle, MD on 10/25/2018    08/29/2018 -  Chemotherapy   Adjuvant CAPOX every 3 weeks with Xeloda 2072m twice daily 2 weeks on 1 week off starting 08/29/18. D/c Oxaliplatin after 09/28/18 (cycle 2) due to very poor toleration. Increased to 20042min AM and 250010mn PM starting with cycle 5.  Last 8th cycle dose was taken 02/26/19.    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  The Multi-Gene Panel offered by [facility] includes sequencing and/or deletion duplication testing of the following 84 genes: AIP, ALK, APC, ATM, AXIN2,BAP1,  BARD1, BLM, BMPR1A, BRCA1, BRCA2, BRIP1, CASR, CDC73, CDH1, CDK4, CDKN1B, CDKN1C, CDKN2A (p14ARF), CDKN2A (p16INK4a), CEBPA, CHEK2, CTNNA1, DICER1, DIS3L2, EGFR (c.2369C>T, p.Thr790Met variant only), EPCAM (Deletion/duplication testing only), FH, FLCN, GATA2, GPC3, GREM1 (Promoter region deletion/duplication testing only), HOXB13 (c.251G>A,  p.Gly84Glu), HRAS, KIT, MAX, MEN1, MET, MITF (c.952G>A, p.Glu318Lys variant only), MLH1, MSH2, MSH3, MSH6, MUTYH, NBN, NF1, NF2, NTHL1, PALB2, PDGFRA, PHOX2B, PMS2, POLD1, POLE, POT1, PRKAR1A, PTCH1, PTEN, RAD50, RAD51C, RAD51D, RB1, RECQL4, RET, RUNX1, SDHAF2, SDHA (sequence changes only), SDHB, SDHC, SDHD, SMAD4, SMARCA4, SMARCB1, SMARCE1, STK11, SUFU, TERC, TERT, TMEM127, TP53, TSC1, TSC2, VHL, WRN and WT1.  The report date is 11/22/2018.  UPDATE: MSH6 c.94G>T (p.Gly32Cys) VUS was reclassified as Likely Benign.  The updated report date is January 10, 2021.   03/20/2019 Imaging   CT CAP 03/20/19  IMPRESSION: 1.  Interval postoperative findings of rectal resection.   2. No definite evidence of metastatic disease in the chest, abdomen, or pelvis.   3. No change in small prominent retroperitoneal lymph nodes. Attention on follow-up.   4. There is a subtle subcapsular hyperdensity of the dome of the liver, likely focal fatty sparing (series 2, image 49). Attention on follow-up.   07/25/2019 Procedure   Colonoscopy by Dr. RouGala Romney3/20  IMPRESSION - Diverticulosis. Surgical anastomosis at 10 cm. Evidence of neoplasm. - The examination was otherwise normal on direct and retroflexion views. - No specimens collected.   09/20/2019 Imaging   CT AP W Contrast 09/20/19  IMPRESSION: Redemonstrated postoperative findings of rectosigmoid resection and reanastomosis. No evidence of recurrent or metastatic disease in the  abdomen or pelvis.   06/23/2020 Imaging   CT CAP W contrast  IMPRESSION: 1. Redemonstrated postoperative findings of rectosigmoid resection and reanastomosis. 2. No evidence of recurrent or metastatic disease in the chest, abdomen, or pelvis.   06/23/2020 Imaging   CT head  IMPRESSION: No evidence of acute intracranial abnormality or intracranial metastatic disease.   1.7 x 2.7 x 3.3 cm posterior fossa arachnoid cyst, unchanged in size as compared to the MRI of 08/29/2013.    Mild ethmoid sinus mucosal thickening.        INTERVAL HISTORY:  Jacob Hayes is here for a follow up of colon cancer. He was last seen by me on 07/30/2021.   He has been overall doing well.  He reports 2 episodes of syncope in past 6 months. Both episodes happened when was riding in an airplane, he suddenly felt warm, lightheaded, and sweating, he then passed out.  No seizure activity was witnessed, he woke up without confusion or other neurologic symptoms.  He had stool incontinence during one of the episode.  He did not seek medical action after the episodes.  He otherwise been doing well, denies any abdominal discomfort, or other new symptoms.   All other systems were reviewed with the patient and are negative.  MEDICAL HISTORY:  Past Medical History:  Diagnosis Date   Allergic rhinitis    Arachnoid cyst    chronic and stable per oncology note pt was had work-up at [facility] 2014   Bruxism    Colon cancer [facility]) oncologist--- dr Burr Medico   dx 09/ 2019 rectosigmoid cancer, Stage IIIB;  s/p  lower anterior colon resection 08-07-2018 and completed chemo 04/ 2020   Essential hypertension, benign    followed by pcp  (pt had ETT done 08-03-2012 in epic, no ischemia)   Family history of breast cancer    Family history of melanoma    GERD    Hyperlipidemia    Since age 85   Hypothyroidism, postradioiodine therapy    followed by pcp---  s/p  RAI 2014   Meniere's disease    Migraine    PONV (postoperative nausea and vomiting)     SURGICAL HISTORY: Past Surgical History:  Procedure Laterality Date   BIOPSY N/A 03/20/2013   Procedure: BIOPSY;  Surgeon: [name], MD;  [facility];  Service: Endoscopy;  Laterality: N/A;  Possible small bowel biopsy   BIOPSY  07/05/2018   Procedure: BIOPSY;  Surgeon: [name], MD;  [facility];  Service: Endoscopy;;  rectal mass   COLONOSCOPY WITH PROPOFOL N/A 07/05/2018   Procedure: COLONOSCOPY WITH PROPOFOL;  Surgeon:  [name], MD;  [facility];  Service: Endoscopy;  Laterality: N/A;  8:30am   COLONOSCOPY WITH PROPOFOL N/A 07/25/2019   Procedure: COLONOSCOPY WITH PROPOFOL;  Surgeon: [name], MD;  [facility];  Service: Endoscopy;  Laterality: N/A;  2:15pm   ESOPHAGOGASTRODUODENOSCOPY N/A 03/20/2013   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  One slightly nodular.  Small hiatal hernia.  Multiple fundic gland appearing polyps. no evidence of celiac disease, malignancy, h.pylori, barrett's.    POLYPECTOMY  07/05/2018   Procedure: POLYPECTOMY;  Surgeon: [name], MD;  [facility];  Service: Endoscopy;;  hepatic flexurex2;   PORT-A-CATH REMOVAL N/A 07/31/2020   Procedure: REMOVAL PORT-A-CATH;  Surgeon: [name], MD;  Location: [facility];  Service: General;  Laterality: N/A;   PORTACATH PLACEMENT N/A 08/23/2018   Procedure: INSERTION PORT-A-CATH;  Surgeon: [name], MD;  Location: [facility];  Service: General;  Laterality: N/A;  LMA   XI ROBOTIC ASSISTED LOWER ANTERIOR RESECTION  08-07-2018   [name]  '@[facility]'     I have reviewed the social history and family history with the patient and they are unchanged from previous note.  ALLERGIES:  has No Known Allergies.  MEDICATIONS:  Current Outpatient Medications  Medication Sig Dispense Refill   acetaminophen (TYLENOL) 500 MG tablet Take 1,000 mg by mouth every 6 (six) hours as needed for moderate pain or headache.      Ascorbic Acid (VITAMIN C) 1000 MG tablet Take 1,000 mg by mouth daily.     b complex vitamins capsule Take 1 capsule by mouth daily.     cetirizine (ZYRTEC) 10 MG tablet Take 10 mg by mouth daily as needed for allergies.      diazepam (VALIUM) 5 MG tablet Take 2.5 mg by mouth at bedtime.   1   diphenhydrAMINE (BENADRYL) 25 MG tablet Take 25-50 mg by mouth 2 (two) times  daily as needed for allergies.     diphenhydramine-acetaminophen (TYLENOL PM) 25-500 MG TABS tablet Take 1 tablet by mouth at bedtime as needed (sleep).     fluticasone (FLONASE) 50 MCG/ACT nasal spray Place 2 sprays into the nose daily. (Patient taking differently: Place 2 sprays into the nose daily as needed (for seasonal allergies.). ) 16 g 11   LORazepam (ATIVAN) 1 MG tablet Take 1 mg by mouth every 8 (eight) hours as needed for anxiety.  (Patient not taking: Reported on 08/12/2020)     losartan-hydrochlorothiazide (HYZAAR) 100-12.5 MG tablet Take 0.5 tablets by mouth 2 (two) times daily.      Magnesium 400 MG TABS Take 1 tablet by mouth in the morning and at bedtime. Per pt takes for dizziness     meclizine (ANTIVERT) 25 MG tablet Take 25 mg by mouth 3 (three) times daily as needed for dizziness.     methimazole (TAPAZOLE) 5 MG tablet Take 5 mg by mouth 2 (two) times daily.      Multiple Vitamin (MULTIVITAMIN WITH MINERALS) TABS tablet Take 1 tablet by mouth daily. Men's One-A-Day     pantoprazole (PROTONIX) 40 MG tablet TAKE ONE TABLET BY MOUTH TWICE DAILY BEFORE A MEAL (Patient taking differently: Take 40 mg by mouth daily. ) 60 tablet 0   simvastatin (ZOCOR) 40 MG tablet Take 40 mg by mouth every evening.      No current facility-administered medications for this visit.   Facility-Administered Medications Ordered in Other Visits  Medication Dose Route Frequency Provider Last Rate Last Admin   influenza vac split quadrivalent PF (FLUARIX) injection 0.5 mL  0.5 mL Intramuscular Once Vaslow, Acey Lav, MD        PHYSICAL EXAMINATION: ECOG PERFORMANCE STATUS: 0 - Asymptomatic  There were no vitals filed for this visit. Wt Readings from Last 3 Encounters:  07/30/21 222 lb 1.6 oz (100.7 kg)  02/26/21 217 lb 3.2 oz (98.5 kg)  11/06/20 217 lb 4.8 oz (98.6 kg)     GENERAL:alert, no distress and comfortable SKIN: skin color normal, no rashes or significant lesions EYES: normal,  Conjunctiva are pink and non-injected, sclera clear  LUNG: Clear to auscultation, no wheezing or rales HEART: Regular, no murmur ABDOMEN: Soft, nontender, no organomegaly. NEURO: alert & oriented x 3 with fluent speech  LABORATORY DATA:  I have reviewed the data as listed CBC Latest Ref Rng & Units 01/28/2022 07/27/2021 02/26/2021  WBC 4.0 - 10.5 K/uL 5.8 3.6(L) 5.6  Hemoglobin 13.0 - 17.0 g/dL 14.3 15.2 14.6  Hematocrit 39.0 - 52.0 % 40.5 43.0 40.9  Platelets 150 - 400 K/uL 270 259 263     CMP Latest Ref Rng & Units 07/27/2021 02/26/2021 11/06/2020  Glucose 70 - 99 mg/dL 90 89 86  BUN 6 - 20 mg/dL '11 11 12  ' Creatinine 0.61 - 1.24 mg/dL 1.20 1.04 1.06  Sodium 135 - 145 mmol/L 140 139 139  Potassium 3.5 - 5.1 mmol/L 4.0 3.8 3.8  Chloride 98 - 111 mmol/L 104 103 104  CO2 22 - 32 mmol/L '25 24 27  ' Calcium 8.9 - 10.3 mg/dL 9.3 8.9 9.2  Total Protein 6.5 - 8.1 g/dL 7.3 7.4 7.5  Total Bilirubin 0.3 - 1.2 mg/dL 0.3 0.8 0.3  Alkaline Phos 38 - 126 U/L 64 60 63  AST 15 - 41 U/L '17 21 19  ' ALT 0 - 44 U/L '17 19 19      ' RADIOGRAPHIC STUDIES: I have personally reviewed the radiological images as listed and agreed with the findings in the report. No results found.    No orders of the defined types were placed in this encounter.  All questions were answered. The patient knows to call the clinic with any problems, questions or concerns. No barriers to learning was detected. The total time spent in the appointment was 25 minutes.     Truitt Merle, MD 01/28/2022

## 2022-01-29 ENCOUNTER — Encounter: Payer: Self-pay | Admitting: Hematology

## 2022-02-02 ENCOUNTER — Telehealth: Payer: Self-pay | Admitting: Internal Medicine

## 2022-02-02 NOTE — Telephone Encounter (Signed)
.  Called pt per 3/13 inbasket , Patient was unavailable, a message with appt time and date was left with number on file.   ?

## 2022-02-10 ENCOUNTER — Inpatient Hospital Stay: Payer: Commercial Managed Care - PPO | Admitting: Internal Medicine

## 2022-02-10 ENCOUNTER — Ambulatory Visit: Payer: Commercial Managed Care - PPO | Admitting: Internal Medicine

## 2022-02-10 ENCOUNTER — Other Ambulatory Visit: Payer: Self-pay

## 2022-02-10 VITALS — BP 157/82 | HR 94 | Temp 98.7°F

## 2022-02-10 DIAGNOSIS — G43009 Migraine without aura, not intractable, without status migrainosus: Secondary | ICD-10-CM

## 2022-02-10 DIAGNOSIS — C19 Malignant neoplasm of rectosigmoid junction: Secondary | ICD-10-CM | POA: Diagnosis not present

## 2022-02-10 DIAGNOSIS — R4020 Unspecified coma: Secondary | ICD-10-CM

## 2022-02-10 NOTE — Progress Notes (Signed)
? ?[facility] [address]  ?[place], [place] [address] ?[phone] ? ? ?Interval Evaluation ? ?Date of Service: 02/10/22 ?Patient Name: Jacob Hayes ?Patient MRN: [id number] ?Patient DOB: [birth year] ?Provider: Ventura Sellers, MD ? ?Identifying Statement:  ?Jacob Hayes is a 41 y.o. male with Migraine without aura and without status migrainosus, not intractable [G43.009]    ? ?Primary Cancer: ? ?Oncologic History: ?Oncology History Overview Note  ?Cancer Staging ?Cancer of sigmoid colon ([place]) ?Staging form: Colon and Rectum, AJCC 8th Edition ?- Pathologic stage from 08/07/2018: Stage IIIB (pT3, pN1a, cM0) - Signed by Jacob Merle, MD on 10/25/2018 ? ? ?  ?Primary adenocarcinoma of rectosigmoid junction ([place])  ?07/17/2018 Initial Diagnosis  ? Primary adenocarcinoma of rectosigmoid junction ([place]) ?  ?Cancer of sigmoid colon ([place])  ?07/05/2018 Procedure  ? Colonoscopy by Dr. Gala Hayes 07/05/18  ?IMPRESSION ?- Tumor in the rectum. Biopsied. ?- Diverticulosis in the entire examined colon. ?- Two 5 to 6 mm polyps, removed with a cold snare. Resected and retrieved. ?- The examination was otherwise normal on direct and retroflexion views. ? ?  ?07/05/2018 Initial Biopsy  ? Diagnosis 07/05/18 ?1. Colon, polyp(s), hepatic flexure ?- TUBULAR ADENOMA (ONE). ?- NO HIGH GRADE DYSPLASIA OR MALIGNANCY. ?2. Rectum, biopsy, mass ?- INVASIVE MODERATELY DIFFERENTIATED COLORECTAL ADENOCARCINOMA. ?  ?07/10/2018 Imaging  ? CT CAP W Constrast 07/10/18  ?IMPRESSION: ?1. Mass within the rectosigmoid colon is identified compatible with ?findings from recent colonoscopy. ?2. No evidence for nodal metastasis or distant metastatic disease. ?3. Left kidney cyst. ?  ?07/17/2018 Imaging  ? MRI PElvis 07/17/18  ?IMPRESSION: ?4.2 cm polypoid lesion along the right lateral aspect of the upper ?rectum, corresponding to the patient's biopsy-proven rectal ?adenocarcinoma, as above. ?Rectal adenocarcinoma T stage:  T1 or  T2 ?Rectal adenocarcinoma N stage:  N0 ?Distance from tumor to the anal sphincter is 14 cm. ?  ?08/07/2018 Initial Diagnosis  ? Rectosigmoid cancer [facility]) ?  ?08/07/2018 Surgery  ? XI ROBOTIC LOW ANTERIOR RESECTION by Dr. Kieth Hayes and Dr. Marcello Hayes  ?  ?08/07/2018 Pathologic Stage  ? Diagnosis 08/07/18 ?1. Colon, segmental resection for tumor, rectosigmoid ?- INVASIVE MODERATELY DIFFERENTIATED COLORECTAL ADENOCARCINOMA, 3.5 CM. ?- CARCINOMA FOCALLY INVOLVES PERICOLONIC CONNECTIVE TISSUE. ?- METASTATIC CARCINOMA IN ONE OF FIFTEEN LYMPH NODES (1/15). ?- MARGINS NOT INVOLVED. ?2. Colon, resection margin (donut), distal ring, sigmoid ?- BENIGN COLON. ?- NO EVIDENCE OF MALIGNANCY. ?  ?08/07/2018 Cancer Staging  ? Staging form: Colon and Rectum, AJCC 8th Edition ?- Pathologic stage from 08/07/2018: Stage IIIB (pT3, pN1a, cM0) - Signed by Jacob Merle, MD on 10/25/2018 ? ?  ?08/29/2018 -  Chemotherapy  ? Adjuvant CAPOX every 3 weeks with Xeloda $RemoveBef'2000mg'tZWQrlPybF$  twice daily 2 weeks on 1 week off starting 08/29/18. D/c Oxaliplatin after 09/28/18 (cycle 2) due to very poor toleration. Increased to $RemoveBefo'2000mg'odZAEklIOaf$  in AM and $Remo'2500mg'pQZjw$  in PM starting with cycle 5.  ?Last 8th cycle dose was taken 02/26/19.  ?  ?11/22/2018 Genetic Testing  ? MSH6 c.94G>T (p.Gly32Cys) VUS identified on the multi-cancer panel.  The Multi-Gene Panel offered by [facility] includes sequencing and/or deletion duplication testing of the following 84 genes: AIP, ALK, APC, ATM, AXIN2,BAP1,  BARD1, BLM, BMPR1A, BRCA1, BRCA2, BRIP1, CASR, CDC73, CDH1, CDK4, CDKN1B, CDKN1C, CDKN2A (p14ARF), CDKN2A (p16INK4a), CEBPA, CHEK2, CTNNA1, DICER1, DIS3L2, EGFR (c.2369C>T, p.Thr790Met variant only), EPCAM (Deletion/duplication testing only), FH, FLCN, GATA2, GPC3, GREM1 (Promoter region deletion/duplication testing only), HOXB13 (c.251G>A, p.Gly84Glu), HRAS, KIT, MAX, MEN1, MET, MITF (c.952G>A,  p.Glu318Lys variant only), MLH1, MSH2, MSH3, MSH6, MUTYH, NBN, NF1, NF2, NTHL1, PALB2, PDGFRA, PHOX2B, PMS2, POLD1,  POLE, POT1, PRKAR1A, PTCH1, PTEN, RAD50, RAD51C, RAD51D, RB1, RECQL4, RET, RUNX1, SDHAF2, SDHA (sequence changes only), SDHB, SDHC, SDHD, SMAD4, SMARCA4, SMARCB1, SMARCE1, STK11, SUFU, TERC, TERT, TMEM127, TP53, TSC1, TSC2, VHL, WRN and WT1.  The report date is 11/22/2018. ? ?UPDATE: MSH6 c.94G>T (p.Gly32Cys) VUS was reclassified as Likely Benign.  The updated report date is January 10, 2021. ?  ?03/20/2019 Imaging  ? CT CAP 03/20/19  ?IMPRESSION: ?1.  Interval postoperative findings of rectal resection. ?  ?2. No definite evidence of metastatic disease in the chest, abdomen, ?or pelvis. ?  ?3. No change in small prominent retroperitoneal lymph nodes. ?Attention on follow-up. ?  ?4. There is a subtle subcapsular hyperdensity of the dome of the ?liver, likely focal fatty sparing (series 2, image 49). Attention on ?follow-up. ?  ?07/25/2019 Procedure  ? Colonoscopy by Dr. Gala Hayes 07/25/19  ?IMPRESSION ?- Diverticulosis. Surgical anastomosis at 10 cm. Evidence of neoplasm. ?- The examination was otherwise normal on direct and retroflexion views. ?- No specimens collected. ?  ?09/20/2019 Imaging  ? CT AP W Contrast 09/20/19  ?IMPRESSION: ?Redemonstrated postoperative findings of rectosigmoid resection and ?reanastomosis. No evidence of recurrent or metastatic disease in the ?abdomen or pelvis. ?  ?06/23/2020 Imaging  ? CT CAP W contrast  ?IMPRESSION: ?1. Redemonstrated postoperative findings of rectosigmoid resection ?and reanastomosis. ?2. No evidence of recurrent or metastatic disease in the chest, ?abdomen, or pelvis. ?  ?06/23/2020 Imaging  ? CT head  ?IMPRESSION: ?No evidence of acute intracranial abnormality or intracranial ?metastatic disease. ?  ?1.7 x 2.7 x 3.3 cm posterior fossa arachnoid cyst, unchanged in size ?as compared to the MRI of 08/29/2013. ?  ?Mild ethmoid sinus mucosal thickening. ? ? ?  ? ? ?Interval History: ? Jacob Hayes presents today for follow up given recent episode.  He describes episode of LOC  while on cross country flight.  Experienced "sudden hot feeling, sweating all over" followed by "feeling light-headed, woozy".  He then lost consciousness, ~5 seconds per others on the airplane.  He was incontinent of bowels.  Following the episode he was sharp mentally but felt anxious remainder of the flight.  No recurrence of the episode on the return flight, or since in the past 3 months.  No heart fluttering, chest pain, shortness of breath. ? ?H+P (07/09/20) Patient presents today to review neurologic complaints.  He describes 10 years history of headaches, described as "pain starting in the middle of back of neck, radiating up to back of the head".  This may be preceded by some neck stiffness.  This then evolves into a several hours long full migraine syndrome, with nausea, photophobia.  Frequency and severity increased when he started chemotherapy in 2019 and developed severe neuropathic symptoms primarily in his hands.  This has mostly resolved, although he does feel some fatigue and aching in his hands after a full day of work, increased from prior to cancer treatment.  For headaches he typically doses Aleve but not until several hours of pain.  He also describes moderate waxing and waning vertiginous symptoms that have been long standing and chronic.  Vertigo is described as "feeling like I'm on a ship" rather than room-spinning type. This has been worked up extensively including at [facility], he currently doses Valium 2.27m HS for clinical suppression of vertigo symptoms.  Recently underwent brain MRI ordered by Dr. FBurr Medicowhich demonstrated cyst  in posterior fossa. ? ?Medications: ?Current Outpatient Medications on File Prior to Visit  ?Medication Sig Dispense Refill  ? acetaminophen (TYLENOL) 500 MG tablet Take 1,000 mg by mouth every 6 (six) hours as needed for moderate pain or headache.     ? Ascorbic Acid (VITAMIN C) 1000 MG tablet Take 1,000 mg by mouth daily.    ? b complex vitamins capsule Take 1 capsule  by mouth daily.    ? cetirizine (ZYRTEC) 10 MG tablet Take 10 mg by mouth daily as needed for allergies.     ? diazepam (VALIUM) 5 MG tablet Take 2.5 mg by mouth at bedtime.   1  ? diphenhydrAMINE (BENAD

## 2022-05-31 ENCOUNTER — Telehealth: Payer: Self-pay | Admitting: Hematology

## 2022-05-31 NOTE — Telephone Encounter (Signed)
Left message with rescheduled upcoming appointment due to provider's template. 

## 2022-07-20 ENCOUNTER — Telehealth: Payer: Self-pay | Admitting: *Deleted

## 2022-07-20 DIAGNOSIS — C187 Malignant neoplasm of sigmoid colon: Secondary | ICD-10-CM

## 2022-07-20 NOTE — Telephone Encounter (Signed)
Pt last had TCS 07/2019. Recall stated not due until 01/2023. Patient wants to confirm this is accurate as he thought he was due now. Please advise [name] thanks!

## 2022-07-21 NOTE — Telephone Encounter (Signed)
Pt scheduled for 9/21 at 1:30pm. Instructions sent via mychart. Sample given

## 2022-07-21 NOTE — Telephone Encounter (Signed)
PA approved via [employer]. Authorization #: [id number], DOS: 08/11/2022-09/09/2022

## 2022-07-21 NOTE — Addendum Note (Signed)
Addended by: Cheron Every on: 07/21/2022 10:24 AM   Modules accepted: Orders

## 2022-08-04 ENCOUNTER — Other Ambulatory Visit
Admission: RE | Admit: 2022-08-04 | Discharge: 2022-08-04 | Disposition: A | Discharge status: Home / Self Care | Payer: Commercial Managed Care - PPO | Attending: Internal Medicine | Admitting: Internal Medicine

## 2022-08-04 DIAGNOSIS — C187 Malignant neoplasm of sigmoid colon: Secondary | ICD-10-CM | POA: Insufficient documentation

## 2022-08-04 LAB — BASIC METABOLIC PANEL
Anion gap: 6 (ref 5–15)
BUN: 14 mg/dL (ref 6–20)
CO2: 27 mmol/L (ref 22–32)
Calcium: 8.9 mg/dL (ref 8.9–10.3)
Chloride: 102 mmol/L (ref 98–111)
Creatinine, Ser: 1.16 mg/dL (ref 0.61–1.24)
GFR, Estimated: >60 mL/min (ref >60)
Glucose, Bld: 106 mg/dL — ABNORMAL HIGH (ref 70–99)
Potassium: 3.5 mmol/L (ref 3.5–5.1)
Sodium: 135 mmol/L (ref 135–145)

## 2022-08-11 ENCOUNTER — Ambulatory Visit (HOSPITAL_COMMUNITY)
Admission: RE | Admit: 2022-08-11 | Discharge: 2022-08-11 | Disposition: A | Discharge status: Home / Self Care | Payer: Commercial Managed Care - PPO | Attending: Internal Medicine | Admitting: Internal Medicine

## 2022-08-11 ENCOUNTER — Other Ambulatory Visit: Payer: Self-pay

## 2022-08-11 ENCOUNTER — Ambulatory Visit (HOSPITAL_COMMUNITY): Payer: Commercial Managed Care - PPO | Admitting: Anesthesiology

## 2022-08-11 ENCOUNTER — Encounter (HOSPITAL_COMMUNITY)
Admission: RE | Disposition: A | Discharge status: Home / Self Care | Payer: Self-pay | Source: Home / Self Care | Attending: Internal Medicine

## 2022-08-11 ENCOUNTER — Ambulatory Visit (HOSPITAL_BASED_OUTPATIENT_CLINIC_OR_DEPARTMENT_OTHER): Payer: Commercial Managed Care - PPO | Admitting: Anesthesiology

## 2022-08-11 ENCOUNTER — Encounter (HOSPITAL_COMMUNITY): Payer: Self-pay | Admitting: Internal Medicine

## 2022-08-11 DIAGNOSIS — Z85038 Personal history of other malignant neoplasm of large intestine: Secondary | ICD-10-CM

## 2022-08-11 DIAGNOSIS — Z980 Intestinal bypass and anastomosis status: Secondary | ICD-10-CM | POA: Insufficient documentation

## 2022-08-11 DIAGNOSIS — D126 Benign neoplasm of colon, unspecified: Secondary | ICD-10-CM | POA: Diagnosis not present

## 2022-08-11 DIAGNOSIS — D124 Benign neoplasm of descending colon: Secondary | ICD-10-CM | POA: Diagnosis not present

## 2022-08-11 DIAGNOSIS — Z9049 Acquired absence of other specified parts of digestive tract: Secondary | ICD-10-CM | POA: Insufficient documentation

## 2022-08-11 DIAGNOSIS — K635 Polyp of colon: Secondary | ICD-10-CM | POA: Diagnosis not present

## 2022-08-11 DIAGNOSIS — K219 Gastro-esophageal reflux disease without esophagitis: Secondary | ICD-10-CM | POA: Insufficient documentation

## 2022-08-11 DIAGNOSIS — E039 Hypothyroidism, unspecified: Secondary | ICD-10-CM | POA: Insufficient documentation

## 2022-08-11 DIAGNOSIS — I10 Essential (primary) hypertension: Secondary | ICD-10-CM | POA: Insufficient documentation

## 2022-08-11 DIAGNOSIS — K5790 Diverticulosis of intestine, part unspecified, without perforation or abscess without bleeding: Secondary | ICD-10-CM | POA: Diagnosis not present

## 2022-08-11 DIAGNOSIS — Z08 Encounter for follow-up examination after completed treatment for malignant neoplasm: Secondary | ICD-10-CM | POA: Diagnosis present

## 2022-08-11 DIAGNOSIS — K5730 Diverticulosis of large intestine without perforation or abscess without bleeding: Secondary | ICD-10-CM | POA: Insufficient documentation

## 2022-08-11 HISTORY — PX: COLONOSCOPY WITH PROPOFOL: SHX5780

## 2022-08-11 HISTORY — PX: POLYPECTOMY: SHX5525

## 2022-08-11 SURGERY — COLONOSCOPY WITH PROPOFOL
Anesthesia: General

## 2022-08-11 MED ORDER — LIDOCAINE HCL (CARDIAC) PF 100 MG/5ML IV SOSY
PREFILLED_SYRINGE | INTRAVENOUS | Status: DC | PRN
  Administered 2022-08-11: 100 mg via INTRAVENOUS

## 2022-08-11 MED ORDER — PROPOFOL 10 MG/ML IV BOLUS
INTRAVENOUS | Status: DC | PRN
  Administered 2022-08-11: 100 mg via INTRAVENOUS
  Administered 2022-08-11: 50 mg via INTRAVENOUS

## 2022-08-11 MED ORDER — LACTATED RINGERS IV SOLN: INTRAVENOUS | Status: DC | PRN

## 2022-08-11 MED ORDER — PROPOFOL 500 MG/50ML IV EMUL
INTRAVENOUS | Status: DC | PRN
  Administered 2022-08-11: 200 ug/kg/min via INTRAVENOUS

## 2022-08-11 MED ORDER — LACTATED RINGERS IV SOLN: INTRAVENOUS | Status: DC

## 2022-08-11 NOTE — Op Note (Signed)
[facility] Patient Name: Jacob Hayes Procedure Date: 08/11/2022 12:54 PM MRN: [id number] Date of Birth: 1981-09-15 Attending MD: Jacob Hayes , MD CSN: [id number] Age: 41 Admit Type: Outpatient Procedure:                Colonoscopy Indications:              High risk colon cancer surveillance: Personal                            history of colon cancer; stage IIIb colon cancer                            diagnosed 2019; negative colonoscopy 2020. Providers:                Jacob Richards, MD, Jacob Lowenstein, RN, Jacob Hayes Technician, Technician, Jacob Hayes Referring MD:              Medicines:                Propofol per Anesthesia Complications:            No immediate complications. Estimated Blood Loss:     Estimated blood loss was minimal. Procedure:                Pre-Anesthesia Assessment:                           - Prior to the procedure, a History and Physical                            was performed, and patient medications and                            allergies were reviewed. The patient's tolerance of                            previous anesthesia was also reviewed. The risks                            and benefits of the procedure and the sedation                            options and risks were discussed with the patient.                            All questions were answered, and informed consent                            was obtained. Prior Anticoagulants: The patient has                            taken no previous anticoagulant or antiplatelet  agents. ASA Grade Assessment: II - A patient with                            mild systemic disease. After reviewing the risks                            and benefits, the patient was deemed in                            satisfactory condition to undergo the procedure.                           After obtaining informed consent, the colonoscope                             was passed under direct vision. Throughout the                            procedure, the patient's blood pressure, pulse, and                            oxygen saturations were monitored continuously. The                            [id number]) scope was introduced through the                            anus and advanced to the the cecum, identified by                            appendiceal orifice and ileocecal valve. The                            colonoscopy was performed without difficulty. The                            patient tolerated the procedure well. The quality                            of the bowel preparation was adequate. Scope In: 1:18:43 PM Scope Out: 1:30:03 PM Scope Withdrawal Time: 0 hours 7 minutes 18 seconds  Total Procedure Duration: 0 hours 11 minutes 20 seconds  Findings:      The perianal and digital rectal examinations were normal.      Scattered medium-mouthed diverticula were found in the entire residual       colon. Surgical anastomosis identified at 10 cm from the anal verge.      A 5 mm polyp was found just above the anastomosis. The polyp was       semi-pedunculated. The polyp was removed with a cold snare. Resection       and retrieval were complete. Estimated blood loss was minimal.      The exam was otherwise without abnormality on direct and retroflexion       views. Impression:               -  Diverticulosis in the entire examined colon.                            Status post prior left colon resection.                           - One 5 mm polyp at the anastomosis, removed with a                            cold snare. Resected and retrieved.                           - The examination was otherwise normal on direct                            and retroflexion views. Moderate Sedation:      Moderate (conscious) sedation was personally administered by an       anesthesia professional. The following parameters were monitored:  oxygen       saturation, heart rate, blood pressure, and response to care. Recommendation:           - Patient has a contact number available for                            emergencies. The signs and symptoms of potential                            delayed complications were discussed with the                            patient. Return to normal activities tomorrow.                            Written discharge instructions were provided to the                            patient.                           - Advance diet as tolerated.                           - Continue present medications.                           - Repeat colonoscopy date to be determined after                            pending pathology results are reviewed for                            surveillance.                           - Return to GI office (date not yet determined). Procedure Code(s):        --- Professional ---  45385, Colonoscopy, flexible; with removal of                            tumor(s), polyp(s), or other lesion(s) by snare                            technique Diagnosis Code(s):        --- Professional ---                           Q75.916, Personal history of other malignant                            neoplasm of large intestine                           K63.5, Polyp of colon                           K57.30, Diverticulosis of large intestine without                            perforation or abscess without bleeding CPT copyright 2019 American Medical Association. All rights reserved. The codes documented in this report are preliminary and upon coder review may  be revised to meet current compliance requirements. Jacob Hayes. Jacob Yousuf, MD Jacob Richards, MD 08/11/2022 1:41:37 PM This report has been signed electronically. Number of Addenda: 0

## 2022-08-11 NOTE — Discharge Instructions (Signed)
  Colonoscopy Discharge Instructions  Read the instructions outlined below and refer to this sheet in the next few weeks. These discharge instructions provide you with general information on caring for yourself after you leave the hospital. Your doctor may also give you specific instructions. While your treatment has been planned according to the most current medical practices available, unavoidable complications occasionally occur. If you have any problems or questions after discharge, call [name] at [phone]. ACTIVITY You may resume your regular activity, but move at a slower pace for the next 24 hours.  Take frequent rest periods for the next 24 hours.  Walking will help get rid of the air and reduce the bloated feeling in your belly (abdomen).  No driving for 24 hours (because of the medicine (anesthesia) used during the test).   Do not sign any important legal documents or operate any machinery for 24 hours (because of the anesthesia used during the test).  NUTRITION Drink plenty of fluids.  You may resume your normal diet as instructed by your doctor.  Begin with a light meal and progress to your normal diet. Heavy or fried foods are harder to digest and may make you feel sick to your stomach (nauseated).  Avoid alcoholic beverages for 24 hours or as instructed.  MEDICATIONS You may resume your normal medications unless your doctor tells you otherwise.  WHAT YOU CAN EXPECT TODAY Some feelings of bloating in the abdomen.  Passage of more gas than usual.  Spotting of blood in your stool or on the toilet paper.  IF YOU HAD POLYPS REMOVED DURING THE COLONOSCOPY: No aspirin products for 7 days or as instructed.  No alcohol for 7 days or as instructed.  Eat a soft diet for the next 24 hours.  FINDING OUT THE RESULTS OF YOUR TEST Not all test results are available during your visit. If your test results are not back during the visit, make an appointment with your caregiver to find out the  results. Do not assume everything is normal if you have not heard from your caregiver or the medical facility. It is important for you to follow up on all of your test results.  SEEK IMMEDIATE MEDICAL ATTENTION IF: You have more than a spotting of blood in your stool.  Your belly is swollen (abdominal distention).  You are nauseated or vomiting.  You have a temperature over 101.  You have abdominal pain or discomfort that is severe or gets worse throughout the day.     your colon looked good today.  1 small polyp removed.   colon polyp and diverticulosis information provided   further recommendations to follow pending review of pathology report   at patient request, I called Kirstie Mirza at [phone] -  call rolled to voicemail-left a message.

## 2022-08-11 NOTE — H&P (Signed)
$'@LOGO'c$ @   Primary Care Physician:  Chesley Noon, MD Primary Gastroenterologist:  [name]  Pre-Procedure History & Physical: HPI:  Jacob Hayes is a 41 y.o. male here for Surveillance colonoscopy.  History of stage IIIb rectal cancer diagnosed by May 2019.  Status post resection and adjuvant chemotherapy.  Negative follow-up colonoscopy  2000 and2020.  No bowel symptoms.  He is here for 3-year surveillance examination per plan.  Past Medical History:  Diagnosis Date   Allergic rhinitis    Arachnoid cyst    chronic and stable per oncology note pt was had work-up at [facility] 2014   Bruxism    Colon cancer [facility]) oncologist--- dr Burr Medico   dx 09/ 2019 rectosigmoid cancer, Stage IIIB;  s/p  lower anterior colon resection 08-07-2018 and completed chemo 04/ 2020   Essential hypertension, benign    followed by pcp  (pt had ETT done 08-03-2012 in epic, no ischemia)   Family history of breast cancer    Family history of melanoma    GERD    Hyperlipidemia    Since age 4   Hypothyroidism, postradioiodine therapy    followed by pcp---  s/p  RAI 2014   Meniere's disease    Migraine    PONV (postoperative nausea and vomiting)     Past Surgical History:  Procedure Laterality Date   BIOPSY N/A 03/20/2013   Procedure: BIOPSY;  Surgeon: [name], MD;  Location: [facility];  Service: Endoscopy;  Laterality: N/A;  Possible small bowel biopsy   BIOPSY  07/05/2018   Procedure: BIOPSY;  Surgeon: [name], MD;  Location: [facility];  Service: Endoscopy;;  rectal mass   COLONOSCOPY WITH PROPOFOL N/A 07/05/2018   Procedure: COLONOSCOPY WITH PROPOFOL;  Surgeon: [name], MD;  Location: [facility];  Service: Endoscopy;  Laterality: N/A;  8:30am   COLONOSCOPY WITH PROPOFOL N/A 07/25/2019   Procedure: COLONOSCOPY WITH PROPOFOL;  Surgeon: [name], MD;  Location: [facility];  Service: Endoscopy;  Laterality: N/A;  2:15pm   ESOPHAGOGASTRODUODENOSCOPY N/A 03/20/2013    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  One slightly nodular.  Small hiatal hernia.  Multiple fundic gland appearing polyps. no evidence of celiac disease, malignancy, h.pylori, barrett's.    POLYPECTOMY  07/05/2018   Procedure: POLYPECTOMY;  Surgeon: [name], MD;  Location: [facility];  Service: Endoscopy;;  hepatic flexurex2;   PORT-A-CATH REMOVAL N/A 07/31/2020   Procedure: REMOVAL PORT-A-CATH;  Surgeon: [name], MD;  Location[facility];  Service: General;  Laterality: N/A;   PORTACATH PLACEMENT N/A 08/23/2018   Procedure: INSERTION PORT-A-CATH;  Surgeon: [name], MD;  Location: [facility];  Service: General;  Laterality: N/A;  LMA   XI ROBOTIC ASSISTED LOWER ANTERIOR RESECTION  08-07-2018   [name]  '@[facility]'$     Prior to Admission medications   Medication Sig Start Date End Date Taking? Authorizing Provider  acetaminophen (TYLENOL) 500 MG tablet Take 1,000 mg by mouth every 6 (six) hours as needed for moderate pain or headache.    Yes [provider]  celecoxib (CELEBREX) 200 MG capsule Take 200 mg by mouth every evening. Five day a week 07/22/22  Yes [provider]  cetirizine (ZYRTEC) 10 MG tablet Take 10 mg by mouth daily as needed for allergies.    Yes [provider]  diazepam (VALIUM) 5 MG  tablet Take 5 mg by mouth at bedtime. 05/03/18  Yes [provider]  diphenhydrAMINE (BENADRYL) 25 MG tablet Take 25-50 mg by mouth at bedtime as needed for allergies.   Yes [provider]  diphenhydramine-acetaminophen (TYLENOL PM) 25-500 MG TABS tablet Take 1 tablet by mouth at bedtime as needed (sleep).   Yes [provider]  ibuprofen (ADVIL) 200 MG tablet Take 600 mg by mouth every 6 (six) hours as needed for mild pain or moderate pain.   Yes [provider]  LORazepam (ATIVAN) 1 MG tablet Take 1  mg by mouth every 8 (eight) hours as needed for anxiety.   Yes [provider]  losartan-hydrochlorothiazide (HYZAAR) 100-12.5 MG tablet Take 0.5 tablets by mouth 2 (two) times daily.  05/02/19  Yes [provider]  meclizine (ANTIVERT) 25 MG tablet Take 25 mg by mouth 3 (three) times daily as needed for dizziness.   Yes [provider]  methimazole (TAPAZOLE) 5 MG tablet Take 5-10 mg by mouth See admin instructions. Take 10 mg in the morning and 5 mg in the evening 05/30/18  Yes [provider]  montelukast (SINGULAIR) 10 MG tablet Take 10 mg by mouth daily. 07/19/22  Yes [provider]  ondansetron (ZOFRAN) 4 MG tablet Take 4 mg by mouth every 8 (eight) hours as needed for nausea or vomiting.   Yes [provider]  pantoprazole (PROTONIX) 40 MG tablet TAKE ONE TABLET BY MOUTH TWICE DAILY BEFORE A MEAL Patient taking differently: Take 40 mg by mouth 2 (two) times daily. 03/02/17  Yes Tassie Pollett, [name], MD  simvastatin (ZOCOR) 40 MG tablet Take 40 mg by mouth at bedtime. 12/07/12  Yes [provider]  fluticasone (FLONASE) 50 MCG/ACT nasal spray Place 2 sprays into the nose daily. Patient taking differently: Place 2 sprays into the nose daily as needed (for seasonal allergies.). 10/19/11   Biagio Borg, MD  ondansetron (ZOFRAN) 8 MG tablet Take by mouth every 8 (eight) hours as needed for nausea or vomiting.    [provider]  prochlorperazine (COMPAZINE) 10 MG tablet Take 10 mg by mouth every 6 (six) hours as needed for nausea or vomiting.    [provider]    Allergies as of 07/21/2022   (No Known Allergies)    Family History  Problem Relation Age of Onset   Breast cancer Mother 37   Thyroid disease Mother        Thyroid removed   Lung cancer Mother    Crohn's disease Mother        ???   Heart disease Father    Stroke Father 65   Hyperlipidemia Father    AAA (abdominal aortic aneurysm) Father    Valvular  heart disease Half-Brother 10       Bicuspid aortic valve   Lung cancer Maternal Aunt    Heart disease Paternal Grandfather 73       d. 52   Other Half-Sister        lump in her breastw   Melanoma Nephew        pat 1/2 sisters' son dx 5x   Colon cancer Neg Hx     Social History   Socioeconomic History   Marital status: Divorced    Spouse name: Not on file   Number of children: 2   Years of education: Not on file   Highest education level: Not on file  Occupational History   Occupation: works with quality control  at [employer]  Tobacco Use   Smoking status: Never   Smokeless tobacco: Never  Vaping Use   Vaping Use: Never used  Substance and Sexual Activity   Alcohol use: No   Drug use: Never   Sexual activity: Yes  Other Topics Concern   Not on file  Social History Narrative   Unable to ask intimate partner questions.   Social Determinants of Health   Financial Resource Strain: Not on file  Food Insecurity: Not on file  Transportation Needs: Not on file  Physical Activity: Not on file  Stress: Not on file  Social Connections: Not on file  Intimate Partner Violence: Not on file    Review of Systems: See HPI, otherwise negative ROS  Physical Exam: BP 129/82   Pulse 76   Temp 98.5 F (36.9 C) (Oral)   Resp 12   Ht '6\' 3"'$  (1.905 m)   Wt 97.5 kg   SpO2 100%   BMI 26.87 kg/m  General:   Alert,  Well-developed, well-nourished, pleasant and cooperative in NAD Neck:  Supple; no masses or thyromegaly. No significant cervical adenopathy. Lungs:  Clear throughout to auscultation.   No wheezes, crackles, or rhonchi. No acute distress. Heart:  Regular rate and rhythm; no murmurs, clicks, rubs,  or gallops. Abdomen: Non-distended, normal bowel sounds.  Soft and nontender without appreciable mass or hepatosplenomegaly.  Pulses:  Normal pulses noted. Extremities:  Without clubbing or edema.  Impression/Plan:    41 year old gentleman here with a history of stage IIIb  rectal cancer status post resection 2019 negative follow-up colonoscopy 2020 here for 3-year examination.  I have offered him a colonoscopy today. The risks, benefits, limitations, alternatives and imponderables have been reviewed with the patient. Questions have been answered. All parties are agreeable.       Notice: This dictation was prepared with Dragon dictation along with smaller phrase technology. Any transcriptional errors that result from this process are unintentional and may not be corrected upon review.

## 2022-08-11 NOTE — Anesthesia Preprocedure Evaluation (Signed)
Anesthesia Evaluation  Patient identified by MRN, date of birth, ID band Patient awake    Reviewed: Allergy & Precautions, H&P , NPO status , Patient's Chart, lab work & pertinent test results, reviewed documented beta blocker date and time   History of Anesthesia Complications (+) PONV and history of anesthetic complications  Airway Mallampati: II  TM Distance: >3 FB Neck ROM: full    Dental no notable dental hx.    Pulmonary neg pulmonary ROS,    Pulmonary exam normal breath sounds clear to auscultation       Cardiovascular Exercise Tolerance: Good hypertension, negative cardio ROS   Rhythm:regular Rate:Normal     Neuro/Psych  Headaches, negative psych ROS   GI/Hepatic Neg liver ROS, GERD  Medicated,  Endo/Other  Hypothyroidism   Renal/GU negative Renal ROS  negative genitourinary   Musculoskeletal   Abdominal   Peds  Hematology negative hematology ROS (+)   Anesthesia Other Findings   Reproductive/Obstetrics negative OB ROS                             Anesthesia Physical Anesthesia Plan  ASA: 2  Anesthesia Plan: General   Post-op Pain Management:    Induction:   PONV Risk Score and Plan: Propofol infusion  Airway Management Planned:   Additional Equipment:   Intra-op Plan:   Post-operative Plan:   Informed Consent: I have reviewed the patients History and Physical, chart, labs and discussed the procedure including the risks, benefits and alternatives for the proposed anesthesia with the patient or authorized representative who has indicated his/her understanding and acceptance.     Dental Advisory Given  Plan Discussed with: CRNA  Anesthesia Plan Comments:         Anesthesia Quick Evaluation

## 2022-08-11 NOTE — Transfer of Care (Signed)
Immediate Anesthesia Transfer of Care Note  Patient: Jacob Hayes  Procedure(s) Performed: COLONOSCOPY WITH PROPOFOL POLYPECTOMY  Patient Location: Short Stay  Anesthesia Type:General  Level of Consciousness: awake, alert , oriented and patient cooperative  Airway & Oxygen Therapy: Patient Spontanous Breathing  Post-op Assessment: Report given to RN, Post -op Vital signs reviewed and stable and Patient moving all extremities X 4  Post vital signs: Reviewed and stable  Last Vitals:  Vitals Value Taken Time  BP    Temp 36.6 C 08/11/22 1335  Pulse 86 08/11/22 1335  Resp 14 08/11/22 1335  SpO2 98 % 08/11/22 1335    Last Pain:  Vitals:   08/11/22 1335  TempSrc: Oral  PainSc: 0-No pain      Patients Stated Pain Goal: 8 (76/28/31 5176)  Complications: No notable events documented.

## 2022-08-12 NOTE — Anesthesia Postprocedure Evaluation (Signed)
Anesthesia Post Note  Patient: Jacob Hayes  Procedure(s) Performed: COLONOSCOPY WITH PROPOFOL POLYPECTOMY  Patient location during evaluation: Phase II Anesthesia Type: General Level of consciousness: awake Pain management: pain level controlled Vital Signs Assessment: post-procedure vital signs reviewed and stable Respiratory status: spontaneous breathing and respiratory function stable Cardiovascular status: blood pressure returned to baseline and stable Postop Assessment: no headache and no apparent nausea or vomiting Anesthetic complications: no Comments: Late entry   No notable events documented.   Last Vitals:  Vitals:   08/11/22 1335 08/11/22 1339  BP:  114/77  Pulse: 86   Resp: 14   Temp: 36.6 C   SpO2: 98%     Last Pain:  Vitals:   08/11/22 1335  TempSrc: Oral  PainSc: 0-No pain                 Louann Sjogren

## 2022-08-15 ENCOUNTER — Encounter: Payer: Self-pay | Admitting: Internal Medicine

## 2022-08-15 LAB — SURGICAL PATHOLOGY

## 2022-08-18 ENCOUNTER — Encounter (HOSPITAL_COMMUNITY): Payer: Self-pay | Admitting: Internal Medicine

## 2022-09-30 ENCOUNTER — Other Ambulatory Visit: Payer: Self-pay

## 2022-09-30 ENCOUNTER — Other Ambulatory Visit: Payer: Commercial Managed Care - PPO

## 2022-09-30 ENCOUNTER — Ambulatory Visit: Payer: Commercial Managed Care - PPO | Admitting: Hematology

## 2022-09-30 ENCOUNTER — Inpatient Hospital Stay (HOSPITAL_BASED_OUTPATIENT_CLINIC_OR_DEPARTMENT_OTHER): Payer: Commercial Managed Care - PPO | Admitting: Hematology

## 2022-09-30 ENCOUNTER — Encounter: Payer: Self-pay | Admitting: Hematology

## 2022-09-30 ENCOUNTER — Inpatient Hospital Stay: Payer: Commercial Managed Care - PPO | Attending: Hematology

## 2022-09-30 VITALS — BP 130/87 | HR 84 | Temp 98.3°F | Resp 18 | Ht 75.0 in | Wt 225.9 lb

## 2022-09-30 DIAGNOSIS — Z1329 Encounter for screening for other suspected endocrine disorder: Secondary | ICD-10-CM

## 2022-09-30 DIAGNOSIS — C19 Malignant neoplasm of rectosigmoid junction: Secondary | ICD-10-CM | POA: Insufficient documentation

## 2022-09-30 DIAGNOSIS — Z8616 Personal history of COVID-19: Secondary | ICD-10-CM | POA: Diagnosis not present

## 2022-09-30 DIAGNOSIS — Z79899 Other long term (current) drug therapy: Secondary | ICD-10-CM | POA: Diagnosis not present

## 2022-09-30 DIAGNOSIS — E785 Hyperlipidemia, unspecified: Secondary | ICD-10-CM

## 2022-09-30 DIAGNOSIS — E0590 Thyrotoxicosis, unspecified without thyrotoxic crisis or storm: Secondary | ICD-10-CM

## 2022-09-30 NOTE — Progress Notes (Signed)
[name]   [phone]) [phone] Fax:(336) [phone]   Clinic Follow up Note   Patient Care Team: Chesley Noon, MD as PCP - General (Family Medicine) [name], MD (Cardiology) [name], MD as Attending Physician (Gastroenterology)  Date of Service:  09/30/2022  CHIEF COMPLAINT: f/u of colon cancer  CURRENT THERAPY:  Surveillance  ASSESSMENT & PLAN:  [name] is a 41 y.o. male with   1. Rectosigmoid Cancer, Stage IIIB (pT3N1M0), MSS -diagnosed in 06/2018. S/p anterior resection with clear margins. He started adjuvant CAPOX 08/23/18, oxali discontinued after C2. Completed 6 more cycles Xeloda alone (total 8 cycles). -most recent CT CAP on 07/27/21 showed NED. -surveillance colonoscopy 08/11/22 by [name], one removed polyp showed tubular adenoma. Recall in 5 years. -He is clinically doing well aside from chronic constipation s/p colon surgery. His PCP requested a lipid panel, but he is not fasting today. He will complete labs at the center in [place] closer to his home. -He is now over 4 years out from diagnosis. Continue surveillance to complete 5 years.  -final f/u in one year       PLAN: -lab at [facility] in next few weeks, I added lipid panel and TSH  -lab and f/u in 12 months (last visit)   No problem-specific Assessment & Plan notes found for this encounter.   SUMMARY OF ONCOLOGIC HISTORY: Oncology History Overview Note  Cancer Staging Cancer of sigmoid colon [facility]) Staging form: Colon and Rectum, AJCC 8th Edition - Pathologic stage from 08/07/2018: Stage IIIB (pT3, pN1a, cM0) - Signed by Truitt Merle, MD on 10/25/2018     Primary adenocarcinoma of rectosigmoid junction ([place])  07/17/2018 Initial Diagnosis   Primary adenocarcinoma of rectosigmoid junction ([place])   Cancer of sigmoid colon ([place])  07/05/2018 Procedure   Colonoscopy by [name] 07/05/18  IMPRESSION - Tumor in the rectum. Biopsied. - Diverticulosis in  the entire examined colon. - Two 5 to 6 mm polyps, removed with a cold snare. Resected and retrieved. - The examination was otherwise normal on direct and retroflexion views.    07/05/2018 Initial Biopsy   Diagnosis 07/05/18 1. Colon, polyp(s), hepatic flexure - TUBULAR ADENOMA (ONE). - NO HIGH GRADE DYSPLASIA OR MALIGNANCY. 2. Rectum, biopsy, mass - INVASIVE MODERATELY DIFFERENTIATED COLORECTAL ADENOCARCINOMA.   07/10/2018 Imaging   CT CAP W Constrast 07/10/18  IMPRESSION: 1. Mass within the rectosigmoid colon is identified compatible with findings from recent colonoscopy. 2. No evidence for nodal metastasis or distant metastatic disease. 3. Left kidney cyst.   07/17/2018 Imaging   MRI PElvis 07/17/18  IMPRESSION: 4.2 cm polypoid lesion along the right lateral aspect of the upper rectum, corresponding to the patient's biopsy-proven rectal adenocarcinoma, as above. Rectal adenocarcinoma T stage:  T1 or T2 Rectal adenocarcinoma N stage:  N0 Distance from tumor to the anal sphincter is 14 cm.   08/07/2018 Initial Diagnosis   Rectosigmoid cancer ([place])   08/07/2018 Surgery   XI ROBOTIC LOW ANTERIOR RESECTION by Dr. Kieth Brightly and Dr. Marcello Moores    08/07/2018 Pathologic Stage   Diagnosis 08/07/18 1. Colon, segmental resection for tumor, rectosigmoid - INVASIVE MODERATELY DIFFERENTIATED COLORECTAL ADENOCARCINOMA, 3.5 CM. - CARCINOMA FOCALLY INVOLVES PERICOLONIC CONNECTIVE TISSUE. - METASTATIC CARCINOMA IN ONE OF FIFTEEN LYMPH NODES (1/15). - MARGINS NOT INVOLVED. 2. Colon, resection margin (donut), distal ring, sigmoid - BENIGN COLON. - NO EVIDENCE OF MALIGNANCY.   08/07/2018 Cancer Staging   Staging form: Colon and Rectum, AJCC 8th Edition -  Pathologic stage from 08/07/2018: Stage IIIB (pT3, pN1a, cM0) - Signed by Truitt Merle, MD on 10/25/2018   08/29/2018 -  Chemotherapy   Adjuvant CAPOX every 3 weeks with Xeloda 2033m twice daily 2 weeks on 1 week off starting 08/29/18. D/c Oxaliplatin  after 09/28/18 (cycle 2) due to very poor toleration. Increased to 20064min AM and 250094mn PM starting with cycle 5.  Last 8th cycle dose was taken 02/26/19.    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  The Multi-Gene Panel offered by [facility] includes sequencing and/or deletion duplication testing of the following 84 genes: AIP, ALK, APC, ATM, AXIN2,BAP1,  BARD1, BLM, BMPR1A, BRCA1, BRCA2, BRIP1, CASR, CDC73, CDH1, CDK4, CDKN1B, CDKN1C, CDKN2A (p14ARF), CDKN2A (p16INK4a), CEBPA, CHEK2, CTNNA1, DICER1, DIS3L2, EGFR (c.2369C>T, p.Thr790Met variant only), EPCAM (Deletion/duplication testing only), FH, FLCN, GATA2, GPC3, GREM1 (Promoter region deletion/duplication testing only), HOXB13 (c.251G>A, p.Gly84Glu), HRAS, KIT, MAX, MEN1, MET, MITF (c.952G>A, p.Glu318Lys variant only), MLH1, MSH2, MSH3, MSH6, MUTYH, NBN, NF1, NF2, NTHL1, PALB2, PDGFRA, PHOX2B, PMS2, POLD1, POLE, POT1, PRKAR1A, PTCH1, PTEN, RAD50, RAD51C, RAD51D, RB1, RECQL4, RET, RUNX1, SDHAF2, SDHA (sequence changes only), SDHB, SDHC, SDHD, SMAD4, SMARCA4, SMARCB1, SMARCE1, STK11, SUFU, TERC, TERT, TMEM127, TP53, TSC1, TSC2, VHL, WRN and WT1.  The report date is 11/22/2018.  UPDATE: MSH6 c.94G>T (p.Gly32Cys) VUS was reclassified as Likely Benign.  The updated report date is January 10, 2021.   03/20/2019 Imaging   CT CAP 03/20/19  IMPRESSION: 1.  Interval postoperative findings of rectal resection.   2. No definite evidence of metastatic disease in the chest, abdomen, or pelvis.   3. No change in small prominent retroperitoneal lymph nodes. Attention on follow-up.   4. There is a subtle subcapsular hyperdensity of the dome of the liver, likely focal fatty sparing (series 2, image 49). Attention on follow-up.   07/25/2019 Procedure   Colonoscopy by Dr. RouGala Romney3/20  IMPRESSION - Diverticulosis. Surgical anastomosis at 10 cm. Evidence of neoplasm. - The examination was otherwise normal  on direct and retroflexion views. - No specimens collected.   09/20/2019 Imaging   CT AP W Contrast 09/20/19  IMPRESSION: Redemonstrated postoperative findings of rectosigmoid resection and reanastomosis. No evidence of recurrent or metastatic disease in the abdomen or pelvis.   06/23/2020 Imaging   CT CAP W contrast  IMPRESSION: 1. Redemonstrated postoperative findings of rectosigmoid resection and reanastomosis. 2. No evidence of recurrent or metastatic disease in the chest, abdomen, or pelvis.   06/23/2020 Imaging   CT head  IMPRESSION: No evidence of acute intracranial abnormality or intracranial metastatic disease.   1.7 x 2.7 x 3.3 cm posterior fossa arachnoid cyst, unchanged in size as compared to the MRI of 08/29/2013.   Mild ethmoid sinus mucosal thickening.        INTERVAL HISTORY:  Jacob Hayes here for a follow up of colon cancer. He was last seen by me on 01/28/22. He presents to the clinic alone. He reports he is doing well overall.  He tells me he clears his throat frequently since he had Covid.    All other systems were reviewed with the patient and are negative.  MEDICAL HISTORY:  Past Medical History:  Diagnosis Date   Allergic rhinitis    Arachnoid cyst    chronic and stable per oncology note pt was had work-up at [facility] 2014   Bruxism    [facility]r Medicodx 09/ 2019 rectosigmoid cancer,  Stage IIIB;  s/p  lower anterior colon resection 08-07-2018 and completed chemo 04/ 2020   Essential hypertension, benign    followed by pcp  (pt had ETT done 08-03-2012 in epic, no ischemia)   Family history of breast cancer    Family history of melanoma    GERD    Hyperlipidemia    Since age 16   Hypothyroidism, postradioiodine therapy    followed by pcp---  s/p  RAI 2014   Meniere's disease    Migraine    PONV (postoperative nausea and vomiting)     SURGICAL HISTORY: Past Surgical History:  Procedure Laterality Date    BIOPSY N/A 03/20/2013   Procedure: BIOPSY;  Surgeon: [name], MD;  [facility];  Service: Endoscopy;  Laterality: N/A;  Possible small bowel biopsy   BIOPSY  07/05/2018   Procedure: BIOPSY;  Surgeon: [name], MD;  [facility];  Service: Endoscopy;;  rectal mass   COLONOSCOPY WITH PROPOFOL N/A 07/05/2018   Procedure: COLONOSCOPY WITH PROPOFOL;  Surgeon: [name], MD;  [facility];  Service: Endoscopy;  Laterality: N/A;  8:30am   COLONOSCOPY WITH PROPOFOL N/A 07/25/2019   Procedure: COLONOSCOPY WITH PROPOFOL;  Surgeon: [name], MD;  [facility];  Service: Endoscopy;  Laterality: N/A;  2:15pm   COLONOSCOPY WITH PROPOFOL N/A 08/11/2022   Procedure: COLONOSCOPY WITH PROPOFOL;  Surgeon: [name], MD;  [facility];  Service: Endoscopy;  Laterality: N/A;  1:30pm   ESOPHAGOGASTRODUODENOSCOPY N/A 03/20/2013   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  One slightly nodular.  Small hiatal hernia.  Multiple fundic gland appearing polyps. no evidence of celiac disease, malignancy, h.pylori, barrett's.    POLYPECTOMY  07/05/2018   Procedure: POLYPECTOMY;  Surgeon: [name], MD;  [facility];  Service: Endoscopy;;  hepatic flexurex2;   POLYPECTOMY  08/11/2022   Procedure: POLYPECTOMY;  Surgeon: [name], MD;  [facility];  Service: Endoscopy;;   PORT-A-CATH REMOVAL N/A 07/31/2020   Procedure: REMOVAL PORT-A-CATH;  Surgeon: [name], MD;  Location[facility];  Service: General;  Laterality: N/A;   PORTACATH PLACEMENT N/A 08/23/2018   Procedure: INSERTION PORT-A-CATH;  Surgeon: [name], MD;  Location: [facility];  Service: General;  Laterality: N/A;  LMA   XI ROBOTIC ASSISTED LOWER ANTERIOR RESECTION  08-07-2018   [name]  _0     I have reviewed the social  history and family history with the patient and they are unchanged from previous note.  ALLERGIES:  has No Known Allergies.  MEDICATIONS:  Current Outpatient Medications  Medication Sig Dispense Refill   acetaminophen (TYLENOL) 500 MG tablet Take 1,000 mg by mouth every 6 (six) hours as needed for moderate pain or headache.      celecoxib (CELEBREX) 200 MG capsule Take 200 mg by mouth every evening. Five day a week     cetirizine (ZYRTEC) 10 MG tablet Take 10 mg by mouth daily as needed for allergies.      diazepam (VALIUM) 5 MG tablet Take 5 mg by mouth at bedtime.  1   diphenhydrAMINE (BENADRYL) 25 MG tablet Take 25-50 mg by mouth at bedtime as needed for allergies.     diphenhydramine-acetaminophen (TYLENOL PM) 25-500 MG TABS tablet Take 1 tablet by mouth at bedtime as needed (sleep).  fluticasone (FLONASE) 50 MCG/ACT nasal spray Place 2 sprays into the nose daily. (Patient taking differently: Place 2 sprays into the nose daily as needed (for seasonal allergies.).) 16 g 11   ibuprofen (ADVIL) 200 MG tablet Take 600 mg by mouth every 6 (six) hours as needed for mild pain or moderate pain.     LORazepam (ATIVAN) 1 MG tablet Take 1 mg by mouth every 8 (eight) hours as needed for anxiety.     losartan-hydrochlorothiazide (HYZAAR) 100-12.5 MG tablet Take 0.5 tablets by mouth 2 (two) times daily.      meclizine (ANTIVERT) 25 MG tablet Take 25 mg by mouth 3 (three) times daily as needed for dizziness.     methimazole (TAPAZOLE) 5 MG tablet Take 5-10 mg by mouth See admin instructions. Take 10 mg in the morning and 5 mg in the evening     montelukast (SINGULAIR) 10 MG tablet Take 10 mg by mouth daily.     ondansetron (ZOFRAN) 4 MG tablet Take 4 mg by mouth every 8 (eight) hours as needed for nausea or vomiting.     ondansetron (ZOFRAN) 8 MG tablet Take by mouth every 8 (eight) hours as needed for nausea or vomiting.     pantoprazole (PROTONIX) 40 MG tablet TAKE ONE TABLET BY MOUTH TWICE DAILY  BEFORE A MEAL (Patient taking differently: Take 40 mg by mouth 2 (two) times daily.) 60 tablet 0   prochlorperazine (COMPAZINE) 10 MG tablet Take 10 mg by mouth every 6 (six) hours as needed for nausea or vomiting.     simvastatin (ZOCOR) 40 MG tablet Take 40 mg by mouth at bedtime.     No current facility-administered medications for this visit.   Facility-Administered Medications Ordered in Other Visits  Medication Dose Route Frequency Provider Last Rate Last Admin   influenza vac split quadrivalent PF (FLUARIX) injection 0.5 mL  0.5 mL Intramuscular Once Vaslow, Acey Lav, MD        PHYSICAL EXAMINATION: ECOG PERFORMANCE STATUS: 0 - Asymptomatic  Vitals:   09/30/22 1516  BP: 130/87  Pulse: 84  Resp: 18  Temp: 98.3 F (36.8 C)  SpO2: 98%   Wt Readings from Last 3 Encounters:  09/30/22 225 lb 14.4 oz (102.5 kg)  08/11/22 215 lb (97.5 kg)  01/28/22 215 lb 12.8 oz (97.9 kg)     GENERAL:alert, no distress and comfortable SKIN: skin color, texture, turgor are normal, no rashes or significant lesions EYES: normal, Conjunctiva are pink and non-injected, sclera clear  LUNGS: clear to auscultation and percussion with normal breathing effort HEART: regular rate & rhythm and no murmurs and no lower extremity edema ABDOMEN: soft, and normal bowel sounds, (+) mild tenderness to palpation over liver area Musculoskeletal:no cyanosis of digits and no clubbing  NEURO: alert & oriented x 3 with fluent speech, no focal motor/sensory deficits  LABORATORY DATA:  I have reviewed the data as listed    Latest Ref Rng & Units 01/28/2022    1:01 PM 07/27/2021    7:58 AM 02/26/2021    3:08 PM  CBC  WBC 4.0 - 10.5 K/uL 5.8  3.6  5.6   Hemoglobin 13.0 - 17.0 g/dL 14.3  15.2  14.6   Hematocrit 39.0 - 52.0 % 40.5  43.0  40.9   Platelets 150 - 400 K/uL 270  259  263         Latest Ref Rng & Units 08/04/2022    4:51 PM 01/28/2022    1:01 PM 07/27/2021  7:58 AM  CMP  Glucose 70 - 99 mg/dL 106  93   90   BUN 6 - 20 mg/dL _0 Creatinine 0.61 - 1.24 mg/dL 1.16  1.02  1.20   Sodium 135 - 145 mmol/L 135  138  140   Potassium 3.5 - 5.1 mmol/L 3.5  3.7  4.0   Chloride 98 - 111 mmol/L 102  106  104   CO2 22 - 32 mmol/L _1 Calcium 8.9 - 10.3 mg/dL 8.9  9.1  9.3   Total Protein 6.5 - 8.1 g/dL  6.9  7.3   Total Bilirubin 0.3 - 1.2 mg/dL  0.7  0.3   Alkaline Phos 38 - 126 U/L  69  64   AST 15 - 41 U/L  21  17   ALT 0 - 44 U/L  16  17       RADIOGRAPHIC STUDIES: I have personally reviewed the radiological images as listed and agreed with the findings in the report. No results found.    Orders Placed This Encounter  Procedures   Lipid panel    Standing Status:   Future    Standing Expiration Date:   09/30/2023   TSH    Standing Status:   Future    Standing Expiration Date:   09/30/2023   All questions were answered. The patient knows to call the clinic with any problems, questions or concerns. No barriers to learning was detected. The total time spent in the appointment was 25 minutes.     Truitt Merle, MD 09/30/2022   I, Wilburn Mylar, am acting as scribe for Truitt Merle, MD.   I have reviewed the above documentation for accuracy and completeness, and I agree with the above.

## 2022-10-04 ENCOUNTER — Other Ambulatory Visit
Admission: RE | Admit: 2022-10-04 | Discharge: 2022-10-04 | Disposition: A | Discharge status: Home / Self Care | Payer: Commercial Managed Care - PPO | Attending: Hematology | Admitting: Hematology

## 2022-10-04 DIAGNOSIS — E785 Hyperlipidemia, unspecified: Secondary | ICD-10-CM | POA: Insufficient documentation

## 2022-10-04 DIAGNOSIS — E0590 Thyrotoxicosis, unspecified without thyrotoxic crisis or storm: Secondary | ICD-10-CM | POA: Insufficient documentation

## 2022-10-04 DIAGNOSIS — C187 Malignant neoplasm of sigmoid colon: Secondary | ICD-10-CM | POA: Insufficient documentation

## 2022-10-04 LAB — CBC WITH DIFFERENTIAL/PLATELET
Abs Immature Granulocytes: 0.02 10*3/uL (ref 0.00–0.07)
Basophils Absolute: 0.1 10*3/uL (ref 0.0–0.1)
Basophils Relative: 1 %
Eosinophils Absolute: 0.1 10*3/uL (ref 0.0–0.5)
Eosinophils Relative: 2 %
HCT: 42.2 % (ref 39.0–52.0)
Hemoglobin: 14.8 g/dL (ref 13.0–17.0)
Immature Granulocytes: 1 %
Lymphocytes Relative: 25 %
Lymphs Abs: 1 10*3/uL (ref 0.7–4.0)
MCH: 31.4 pg (ref 26.0–34.0)
MCHC: 35.1 g/dL (ref 30.0–36.0)
MCV: 89.6 fL (ref 80.0–100.0)
Monocytes Absolute: 0.4 10*3/uL (ref 0.1–1.0)
Monocytes Relative: 10 %
Neutro Abs: 2.4 10*3/uL (ref 1.7–7.7)
Neutrophils Relative %: 61 %
Platelets: 273 10*3/uL (ref 150–400)
RBC: 4.71 MIL/uL (ref 4.22–5.81)
RDW: 12.7 % (ref 11.5–15.5)
WBC: 3.9 10*3/uL — ABNORMAL LOW (ref 4.0–10.5)
nRBC: 0 % (ref 0.0–0.2)

## 2022-10-04 LAB — COMPREHENSIVE METABOLIC PANEL
ALT: 22 U/L (ref 0–44)
AST: 21 U/L (ref 15–41)
Albumin: 4.4 g/dL (ref 3.5–5.0)
Alkaline Phosphatase: 67 U/L (ref 38–126)
Anion gap: 3 — ABNORMAL LOW (ref 5–15)
BUN: 13 mg/dL (ref 6–20)
CO2: 27 mmol/L (ref 22–32)
Calcium: 9 mg/dL (ref 8.9–10.3)
Chloride: 103 mmol/L (ref 98–111)
Creatinine, Ser: 1.08 mg/dL (ref 0.61–1.24)
GFR, Estimated: >60 mL/min (ref >60)
Glucose, Bld: 100 mg/dL — ABNORMAL HIGH (ref 70–99)
Potassium: 3.7 mmol/L (ref 3.5–5.1)
Sodium: 133 mmol/L — ABNORMAL LOW (ref 135–145)
Total Bilirubin: 0.7 mg/dL (ref 0.3–1.2)
Total Protein: 7.6 g/dL (ref 6.5–8.1)

## 2022-10-04 LAB — LIPID PANEL
Cholesterol: 242 mg/dL — ABNORMAL HIGH (ref 0–200)
HDL: 45 mg/dL (ref >40)
LDL Cholesterol: 171 mg/dL — ABNORMAL HIGH (ref 0–99)
Total CHOL/HDL Ratio: 5.4 RATIO
Triglycerides: 130 mg/dL (ref <150)
VLDL: 26 mg/dL (ref 0–40)

## 2022-10-04 LAB — TSH: TSH: 7.962 u[IU]/mL — ABNORMAL HIGH (ref 0.350–4.500)

## 2022-10-05 LAB — CEA: CEA: 1.1 ng/mL (ref 0.0–4.7)

## 2022-10-10 ENCOUNTER — Telehealth: Payer: Self-pay

## 2022-10-10 NOTE — Telephone Encounter (Signed)
Faxed patients labs to PCP tried to call patient n/a left message on voice mail to call back.   Truitt Merle, MD sent to Evalee Jefferson, RN Cc: Elayne Guerin, CMA Please let pt know his lab results, and fax to his PCP. Will defer his abnormal TSH and lipid levels to his PCP, no other concerns. Thanks  Truitt Merle

## 2022-10-10 NOTE — Telephone Encounter (Signed)
Patient returned call made him aware of message below from Dr. Magnus Sinning, MD sent to Evalee Jefferson, RN Cc: Elayne Guerin, CMA Please let pt know his lab results, and fax to his PCP. Will defer his abnormal TSH and lipid levels to his PCP, no other concerns. Thanks  Truitt Merle

## 2023-10-05 ENCOUNTER — Encounter: Payer: Self-pay | Admitting: Hematology

## 2023-10-05 ENCOUNTER — Inpatient Hospital Stay: Payer: Commercial Managed Care - PPO | Attending: Hematology | Admitting: Hematology

## 2023-10-05 ENCOUNTER — Inpatient Hospital Stay: Payer: Commercial Managed Care - PPO

## 2023-10-05 VITALS — BP 137/88 | HR 76 | Temp 98.2°F | Resp 18 | Ht 75.0 in | Wt 226.1 lb

## 2023-10-05 DIAGNOSIS — F419 Anxiety disorder, unspecified: Secondary | ICD-10-CM | POA: Insufficient documentation

## 2023-10-05 DIAGNOSIS — C187 Malignant neoplasm of sigmoid colon: Secondary | ICD-10-CM

## 2023-10-05 DIAGNOSIS — Z79899 Other long term (current) drug therapy: Secondary | ICD-10-CM | POA: Diagnosis not present

## 2023-10-05 DIAGNOSIS — R194 Change in bowel habit: Secondary | ICD-10-CM | POA: Insufficient documentation

## 2023-10-05 DIAGNOSIS — Z08 Encounter for follow-up examination after completed treatment for malignant neoplasm: Secondary | ICD-10-CM | POA: Insufficient documentation

## 2023-10-05 DIAGNOSIS — Z860100 Personal history of colon polyps, unspecified: Secondary | ICD-10-CM | POA: Diagnosis not present

## 2023-10-05 DIAGNOSIS — Z85048 Personal history of other malignant neoplasm of rectum, rectosigmoid junction, and anus: Secondary | ICD-10-CM | POA: Diagnosis present

## 2023-10-05 LAB — CBC WITH DIFFERENTIAL (CANCER CENTER ONLY)
Abs Immature Granulocytes: 0.01 10*3/uL (ref 0.00–0.07)
Basophils Absolute: 0 10*3/uL (ref 0.0–0.1)
Basophils Relative: 1 %
Eosinophils Absolute: 0 10*3/uL (ref 0.0–0.5)
Eosinophils Relative: 1 %
HCT: 41 % (ref 39.0–52.0)
Hemoglobin: 14.8 g/dL (ref 13.0–17.0)
Immature Granulocytes: 0 %
Lymphocytes Relative: 28 %
Lymphs Abs: 1 10*3/uL (ref 0.7–4.0)
MCH: 32.1 pg (ref 26.0–34.0)
MCHC: 36.1 g/dL — ABNORMAL HIGH (ref 30.0–36.0)
MCV: 88.9 fL (ref 80.0–100.0)
Monocytes Absolute: 0.6 10*3/uL (ref 0.1–1.0)
Monocytes Relative: 16 %
Neutro Abs: 1.9 10*3/uL (ref 1.7–7.7)
Neutrophils Relative %: 54 %
Platelet Count: 247 10*3/uL (ref 150–400)
RBC: 4.61 MIL/uL (ref 4.22–5.81)
RDW: 12.4 % (ref 11.5–15.5)
WBC Count: 3.5 10*3/uL — ABNORMAL LOW (ref 4.0–10.5)
nRBC: 0 % (ref 0.0–0.2)

## 2023-10-05 LAB — CMP (CANCER CENTER ONLY)
ALT: 20 U/L (ref 0–44)
AST: 19 U/L (ref 15–41)
Albumin: 4.2 g/dL (ref 3.5–5.0)
Alkaline Phosphatase: 61 U/L (ref 38–126)
Anion gap: 4 — ABNORMAL LOW (ref 5–15)
BUN: 13 mg/dL (ref 6–20)
CO2: 29 mmol/L (ref 22–32)
Calcium: 9.3 mg/dL (ref 8.9–10.3)
Chloride: 105 mmol/L (ref 98–111)
Creatinine: 1.02 mg/dL (ref 0.61–1.24)
GFR, Estimated: >60 mL/min (ref >60)
Glucose, Bld: 91 mg/dL (ref 70–99)
Potassium: 3.9 mmol/L (ref 3.5–5.1)
Sodium: 138 mmol/L (ref 135–145)
Total Bilirubin: 0.4 mg/dL (ref <1.2)
Total Protein: 7 g/dL (ref 6.5–8.1)

## 2023-10-05 NOTE — Assessment & Plan Note (Signed)
Stage IIIB (pT3N1M0), MSS -diagnosed in 06/2018. S/p anterior resection with clear margins. He started adjuvant CAPOX 08/23/18, oxali discontinued after C2. Completed 6 more cycles Xeloda alone (total 8 cycles). -most recent CT CAP on 07/27/21 showed NED. -surveillance colonoscopy 08/11/22 by Dr. Jena Gauss, one removed polyp showed tubular adenoma. Recall in 5 years.

## 2023-10-05 NOTE — Progress Notes (Signed)
[facility]   Telephone:([phone]) [phone] Fax:(336) [phone]   Clinic Follow up Note   Patient Care Team: Eartha Inch, MD as PCP - General (Family Medicine) Jonelle Sidle, MD (Cardiology) Jena Gauss Gerrit Friends, MD as Attending Physician (Gastroenterology)  Date of Service:  10/05/2023  CHIEF COMPLAINT: f/u of colon cancer   CURRENT THERAPY:  Surveillance  Oncology History   Cancer of sigmoid colon (HCC) Stage IIIB (pT3N1M0), MSS -diagnosed in 06/2018. S/p anterior resection with clear margins. He started adjuvant CAPOX 08/23/18, oxali discontinued after C2. Completed 6 more cycles Xeloda alone (total 8 cycles). -most recent CT CAP on 07/27/21 showed NED. -surveillance colonoscopy 08/11/22 by Dr. Jena Gauss, one removed polyp showed tubular adenoma. Recall in 5 years.   Assessment and Plan    Colon Cancer Follow-up Diagnosed August 2019, with surgery and chemotherapy completed. Current concerns: anxiety about recurrence, bowel movement irregularities, and frequent polyps. Labs: normal CBC, CMP, and CEA. Previous colonoscopy (September 2023) showed tuberous adenoma; recommended follow-up in three years. Discussed CEA limitations and colonoscopy as the gold standard for screening and polyp removal. Cologuard is an alternative but may not be covered by insurance. Prefers aggressive screening due to anxiety and history of polyps. - Schedule next colonoscopy for September 2026 - Consider Cologuard test in Spring 2025 then every 3 years if insurance covers  Bowel Movement Irregularities Reports alternating constipation and diarrhea, bloating, gas, and cramping. Symptoms may be related to previous surgery and chemotherapy. Probiotics caused nausea.  - Follow up with GI specialist for further evaluation   Follow-up - Follow up with primary care physician for overall health and screenings -f/u with GI for colonoscopy -he has completed 5 years of cancer surveillance, I will see  him as needed.      SUMMARY OF ONCOLOGIC HISTORY: Oncology History Overview Note  Cancer Staging Cancer of sigmoid colon [facility]) Staging form: Colon and Rectum, AJCC 8th Edition - Pathologic stage from 08/07/2018: Stage IIIB (pT3, pN1a, cM0) - Signed by Malachy Mood, MD on 10/25/2018     Primary adenocarcinoma of rectosigmoid junction (HCC)  07/17/2018 Initial Diagnosis   Primary adenocarcinoma of rectosigmoid junction (HCC)   Cancer of sigmoid colon (HCC)  07/05/2018 Procedure   Colonoscopy by Dr. Jena Gauss 07/05/18  IMPRESSION - Tumor in the rectum. Biopsied. - Diverticulosis in the entire examined colon. - Two 5 to 6 mm polyps, removed with a cold snare. Resected and retrieved. - The examination was otherwise normal on direct and retroflexion views.    07/05/2018 Initial Biopsy   Diagnosis 07/05/18 1. Colon, polyp(s), hepatic flexure - TUBULAR ADENOMA (ONE). - NO HIGH GRADE DYSPLASIA OR MALIGNANCY. 2. Rectum, biopsy, mass - INVASIVE MODERATELY DIFFERENTIATED COLORECTAL ADENOCARCINOMA.   07/10/2018 Imaging   CT CAP W Constrast 07/10/18  IMPRESSION: 1. Mass within the rectosigmoid colon is identified compatible with findings from recent colonoscopy. 2. No evidence for nodal metastasis or distant metastatic disease. 3. Left kidney cyst.   07/17/2018 Imaging   MRI PElvis 07/17/18  IMPRESSION: 4.2 cm polypoid lesion along the right lateral aspect of the upper rectum, corresponding to the patient's biopsy-proven rectal adenocarcinoma, as above. Rectal adenocarcinoma T stage:  T1 or T2 Rectal adenocarcinoma N stage:  N0 Distance from tumor to the anal sphincter is 14 cm.   08/07/2018 Initial Diagnosis   Rectosigmoid cancer (HCC)   08/07/2018 Surgery   XI ROBOTIC LOW ANTERIOR RESECTION by Dr. Sheliah Hatch and Dr. Maisie Fus    08/07/2018 Pathologic Stage   Diagnosis 08/07/18  1. Colon, segmental resection for tumor, rectosigmoid - INVASIVE MODERATELY DIFFERENTIATED COLORECTAL  ADENOCARCINOMA, 3.5 CM. - CARCINOMA FOCALLY INVOLVES PERICOLONIC CONNECTIVE TISSUE. - METASTATIC CARCINOMA IN ONE OF FIFTEEN LYMPH NODES (1/15). - MARGINS NOT INVOLVED. 2. Colon, resection margin (donut), distal ring, sigmoid - BENIGN COLON. - NO EVIDENCE OF MALIGNANCY.   08/07/2018 Cancer Staging   Staging form: Colon and Rectum, AJCC 8th Edition - Pathologic stage from 08/07/2018: Stage IIIB (pT3, pN1a, cM0) - Signed by Malachy Mood, MD on 10/25/2018   08/29/2018 -  Chemotherapy   Adjuvant CAPOX every 3 weeks with Xeloda 2000mg  twice daily 2 weeks on 1 week off starting 08/29/18. D/c Oxaliplatin after 09/28/18 (cycle 2) due to very poor toleration. Increased to 2000mg  in AM and 2500mg  in PM starting with cycle 5.  Last 8th cycle dose was taken 02/26/19.    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  The Multi-Gene Panel offered by [facility] includes sequencing and/or deletion duplication testing of the following 84 genes: AIP, ALK, APC, ATM, AXIN2,BAP1,  BARD1, BLM, BMPR1A, BRCA1, BRCA2, BRIP1, CASR, CDC73, CDH1, CDK4, CDKN1B, CDKN1C, CDKN2A (p14ARF), CDKN2A (p16INK4a), CEBPA, CHEK2, CTNNA1, DICER1, DIS3L2, EGFR (c.2369C>T, p.Thr790Met variant only), EPCAM (Deletion/duplication testing only), FH, FLCN, GATA2, GPC3, GREM1 (Promoter region deletion/duplication testing only), HOXB13 (c.251G>A, p.Gly84Glu), HRAS, KIT, MAX, MEN1, MET, MITF (c.952G>A, p.Glu318Lys variant only), MLH1, MSH2, MSH3, MSH6, MUTYH, NBN, NF1, NF2, NTHL1, PALB2, PDGFRA, PHOX2B, PMS2, POLD1, POLE, POT1, PRKAR1A, PTCH1, PTEN, RAD50, RAD51C, RAD51D, RB1, RECQL4, RET, RUNX1, SDHAF2, SDHA (sequence changes only), SDHB, SDHC, SDHD, SMAD4, SMARCA4, SMARCB1, SMARCE1, STK11, SUFU, TERC, TERT, TMEM127, TP53, TSC1, TSC2, VHL, WRN and WT1.  The report date is 11/22/2018.  UPDATE: MSH6 c.94G>T (p.Gly32Cys) VUS was reclassified as Likely Benign.  The updated report date is January 10, 2021.    03/20/2019 Imaging   CT CAP 03/20/19  IMPRESSION: 1.  Interval postoperative findings of rectal resection.   2. No definite evidence of metastatic disease in the chest, abdomen, or pelvis.   3. No change in small prominent retroperitoneal lymph nodes. Attention on follow-up.   4. There is a subtle subcapsular hyperdensity of the dome of the liver, likely focal fatty sparing (series 2, image 49). Attention on follow-up.   07/25/2019 Procedure   Colonoscopy by Dr. Jena Gauss 07/25/19  IMPRESSION - Diverticulosis. Surgical anastomosis at 10 cm. Evidence of neoplasm. - The examination was otherwise normal on direct and retroflexion views. - No specimens collected.   09/20/2019 Imaging   CT AP W Contrast 09/20/19  IMPRESSION: Redemonstrated postoperative findings of rectosigmoid resection and reanastomosis. No evidence of recurrent or metastatic disease in the abdomen or pelvis.   06/23/2020 Imaging   CT CAP W contrast  IMPRESSION: 1. Redemonstrated postoperative findings of rectosigmoid resection and reanastomosis. 2. No evidence of recurrent or metastatic disease in the chest, abdomen, or pelvis.   06/23/2020 Imaging   CT head  IMPRESSION: No evidence of acute intracranial abnormality or intracranial metastatic disease.   1.7 x 2.7 x 3.3 cm posterior fossa arachnoid cyst, unchanged in size as compared to the MRI of 08/29/2013.   Mild ethmoid sinus mucosal thickening.        Discussed the use of AI scribe software for clinical note transcription with the patient, who gave verbal consent to proceed.  History of Present Illness   A 42 year old male with a history of colon cancer presents for follow-up. He reports increased anxiety about the possibility of cancer recurrence  and expresses a desire for aggressive screening. He has noticed changes in his bowel habits, including increased frequency, alternating constipation and diarrhea, and a constant feeling of needing to defecate.  He also reports feelings of bloating, gas, and cramping. He has a history of multiple polyps and is concerned about the development of new ones. He also mentions some abdominal discomfort, which he describes as a constant tenderness in the area. The patient is otherwise active and engaged in his work. He denies any other new symptoms or health concerns.         All other systems were reviewed with the patient and are negative.  MEDICAL HISTORY:  Past Medical History:  Diagnosis Date   Allergic rhinitis    Arachnoid cyst    chronic and stable per oncology note pt was had work-up at [facility] 2014   Bruxism    Colon cancer [facility]) oncologist--- dr Mosetta Putt   dx 09/ 2019 rectosigmoid cancer, Stage IIIB;  s/p  lower anterior colon resection 08-07-2018 and completed chemo 04/ 2020   Essential hypertension, benign    followed by pcp  (pt had ETT done 08-03-2012 in epic, no ischemia)   Family history of breast cancer    Family history of melanoma    GERD    Hyperlipidemia    Since age 27   Hypothyroidism, postradioiodine therapy    followed by pcp---  s/p  RAI 2014   Meniere's disease    Migraine    PONV (postoperative nausea and vomiting)     SURGICAL HISTORY: Past Surgical History:  Procedure Laterality Date   BIOPSY N/A 03/20/2013   Procedure: BIOPSY;  Surgeon: Corbin Ade, MD;  [facility];  Service: Endoscopy;  Laterality: N/A;  Possible small bowel biopsy   BIOPSY  07/05/2018   Procedure: BIOPSY;  Surgeon: Corbin Ade, MD;  [facility]: [facility];  Service: Endoscopy;;  rectal mass   COLONOSCOPY WITH PROPOFOL N/A 07/05/2018   Procedure: COLONOSCOPY WITH PROPOFOL;  Surgeon: Corbin Ade, MD;  [facility];  Service: Endoscopy;  Laterality: N/A;  8:30am   COLONOSCOPY WITH PROPOFOL N/A 07/25/2019   Procedure: COLONOSCOPY WITH PROPOFOL;  Surgeon: Corbin Ade, MD;  [facility];  Service: Endoscopy;  Laterality: N/A;  2:15pm   COLONOSCOPY WITH  PROPOFOL N/A 08/11/2022   Procedure: COLONOSCOPY WITH PROPOFOL;  Surgeon: Corbin Ade, MD;  [facility];  Service: Endoscopy;  Laterality: N/A;  1:30pm   ESOPHAGOGASTRODUODENOSCOPY N/A 03/20/2013   Dr. Jena Gauss, in the upper esophagus, through the upper esophageal sphincter, multiple areas of salmon-colored epithelium consistent with inlet patches.  One slightly nodular.  Small hiatal hernia.  Multiple fundic gland appearing polyps. no evidence of celiac disease, malignancy, h.pylori, barrett's.    POLYPECTOMY  07/05/2018   Procedure: POLYPECTOMY;  Surgeon: Corbin Ade, MD;  [facility];  Service: Endoscopy;;  hepatic flexurex2;   POLYPECTOMY  08/11/2022   Procedure: POLYPECTOMY;  Surgeon: Corbin Ade, MD;  [facility]: [facility];  Service: Endoscopy;;   PORT-A-CATH REMOVAL N/A 07/31/2020   Procedure: REMOVAL PORT-A-CATH;  Surgeon: Romie Levee, MD;  Location: [facility];  Service: General;  Laterality: N/A;   PORTACATH PLACEMENT N/A 08/23/2018   Procedure: INSERTION PORT-A-CATH;  Surgeon: Romie Levee, MD;  Location: [facility];  Service: General;  Laterality: N/A;  LMA   XI ROBOTIC ASSISTED LOWER ANTERIOR RESECTION  08-07-2018   dr Maisie Fus  @[facility]   I have reviewed the social history and family history with the patient and they are unchanged from previous note.  ALLERGIES:  has No Known Allergies.  MEDICATIONS:  Current Outpatient Medications  Medication Sig Dispense Refill   acetaminophen (TYLENOL) 500 MG tablet Take 1,000 mg by mouth every 6 (six) hours as needed for moderate pain or headache.      celecoxib (CELEBREX) 200 MG capsule Take 200 mg by mouth every evening. Five day a week     cetirizine (ZYRTEC) 10 MG tablet Take 10 mg by mouth daily as needed for allergies.      diazepam (VALIUM) 5 MG tablet Take 5 mg by mouth at bedtime.  1   diphenhydrAMINE (BENADRYL) 25 MG tablet Take 25-50 mg by mouth at bedtime as needed for allergies.      diphenhydramine-acetaminophen (TYLENOL PM) 25-500 MG TABS tablet Take 1 tablet by mouth at bedtime as needed (sleep).     fluticasone (FLONASE) 50 MCG/ACT nasal spray Place 2 sprays into the nose daily. (Patient taking differently: Place 2 sprays into the nose daily as needed (for seasonal allergies.).) 16 g 11   ibuprofen (ADVIL) 200 MG tablet Take 600 mg by mouth every 6 (six) hours as needed for mild pain or moderate pain.     LORazepam (ATIVAN) 1 MG tablet Take 1 mg by mouth every 8 (eight) hours as needed for anxiety.     losartan-hydrochlorothiazide (HYZAAR) 100-12.5 MG tablet Take 0.5 tablets by mouth 2 (two) times daily.      meclizine (ANTIVERT) 25 MG tablet Take 25 mg by mouth 3 (three) times daily as needed for dizziness.     methimazole (TAPAZOLE) 5 MG tablet Take 5-10 mg by mouth See admin instructions. Take 10 mg in the morning and 5 mg in the evening     montelukast (SINGULAIR) 10 MG tablet Take 10 mg by mouth daily.     ondansetron (ZOFRAN) 4 MG tablet Take 4 mg by mouth every 8 (eight) hours as needed for nausea or vomiting.     ondansetron (ZOFRAN) 8 MG tablet Take by mouth every 8 (eight) hours as needed for nausea or vomiting.     pantoprazole (PROTONIX) 40 MG tablet TAKE ONE TABLET BY MOUTH TWICE DAILY BEFORE A MEAL (Patient taking differently: Take 40 mg by mouth 2 (two) times daily.) 60 tablet 0   prochlorperazine (COMPAZINE) 10 MG tablet Take 10 mg by mouth every 6 (six) hours as needed for nausea or vomiting.     simvastatin (ZOCOR) 40 MG tablet Take 40 mg by mouth at bedtime.     No current facility-administered medications for this visit.   Facility-Administered Medications Ordered in Other Visits  Medication Dose Route Frequency Provider Last Rate Last Admin   influenza vac split quadrivalent PF (FLUARIX) injection 0.5 mL  0.5 mL Intramuscular Once Vaslow, Georgeanna Lea, MD        PHYSICAL EXAMINATION: ECOG PERFORMANCE STATUS: 0 - Asymptomatic  Vitals:   10/05/23 1431   BP: 137/88  Pulse: 76  Resp: 18  Temp: 98.2 F (36.8 C)  SpO2: 97%   Wt Readings from Last 3 Encounters:  10/05/23 226 lb 1.6 oz (102.6 kg)  09/30/22 225 lb 14.4 oz (102.5 kg)  08/11/22 215 lb (97.5 kg)     GENERAL:alert, no distress and comfortable SKIN: skin color, texture, turgor are normal, no rashes or significant lesions EYES: normal, Conjunctiva are pink and non-injected, sclera clear NECK: supple, thyroid normal size, non-tender, without nodularity  LYMPH:  no palpable lymphadenopathy in the cervical, axillary  LUNGS: clear to auscultation and percussion with normal breathing effort HEART: regular rate & rhythm and no murmurs and no lower extremity edema ABDOMEN:abdomen soft, non-tender and normal bowel sounds Musculoskeletal:no cyanosis of digits and no clubbing  NEURO: alert & oriented x 3 with fluent speech, no focal motor/sensory deficits   LABORATORY DATA:  I have reviewed the data as listed    Latest Ref Rng & Units 10/05/2023    2:04 PM 10/04/2022    8:26 AM 01/28/2022    1:01 PM  CBC  WBC 4.0 - 10.5 K/uL 3.5  3.9  5.8   Hemoglobin 13.0 - 17.0 g/dL 95.6  21.3  08.6   Hematocrit 39.0 - 52.0 % 41.0  42.2  40.5   Platelets 150 - 400 K/uL 247  273  270         Latest Ref Rng & Units 10/05/2023    2:04 PM 10/04/2022    8:29 AM 08/04/2022    4:51 PM  CMP  Glucose 70 - 99 mg/dL 91  578  469   BUN 6 - 20 mg/dL 13  13  14    Creatinine 0.61 - 1.24 mg/dL 6.29  5.28  4.13   Sodium 135 - 145 mmol/L 138  133  135   Potassium 3.5 - 5.1 mmol/L 3.9  3.7  3.5   Chloride 98 - 111 mmol/L 105  103  102   CO2 22 - 32 mmol/L 29  27  27    Calcium 8.9 - 10.3 mg/dL 9.3  9.0  8.9   Total Protein 6.5 - 8.1 g/dL 7.0  7.6    Total Bilirubin <1.2 mg/dL 0.4  0.7    Alkaline Phos 38 - 126 U/L 61  67    AST 15 - 41 U/L 19  21    ALT 0 - 44 U/L 20  22        RADIOGRAPHIC STUDIES: I have personally reviewed the radiological images as listed and agreed with the findings in  the report. No results found.    No orders of the defined types were placed in this encounter.  All questions were answered. The patient knows to call the clinic with any problems, questions or concerns. No barriers to learning was detected. The total time spent in the appointment was 20 minutes.     Malachy Mood, MD 10/05/2023

## 2023-10-06 LAB — CEA (IN HOUSE-CHCC): CEA (CHCC-In House): <1 ng/mL (ref 0.00–5.00)
